# Patient Record
Sex: Female | Born: 1970 | Race: Black or African American | Hispanic: No | Marital: Single | Smoking: Never smoker
Health system: Southern US, Community
[De-identification: ages and names within clinical notes are randomized; demographics above are authoritative.]

## PROBLEM LIST (undated history)

## (undated) DIAGNOSIS — A64 Unspecified sexually transmitted disease: Secondary | ICD-10-CM

## (undated) DIAGNOSIS — D219 Benign neoplasm of connective and other soft tissue, unspecified: Secondary | ICD-10-CM

## (undated) DIAGNOSIS — J329 Chronic sinusitis, unspecified: Secondary | ICD-10-CM

## (undated) DIAGNOSIS — G43909 Migraine, unspecified, not intractable, without status migrainosus: Secondary | ICD-10-CM

## (undated) DIAGNOSIS — D649 Anemia, unspecified: Secondary | ICD-10-CM

## (undated) DIAGNOSIS — I10 Essential (primary) hypertension: Secondary | ICD-10-CM

## (undated) DIAGNOSIS — K589 Irritable bowel syndrome without diarrhea: Secondary | ICD-10-CM

## (undated) DIAGNOSIS — E049 Nontoxic goiter, unspecified: Secondary | ICD-10-CM

## (undated) DIAGNOSIS — K219 Gastro-esophageal reflux disease without esophagitis: Secondary | ICD-10-CM

## (undated) DIAGNOSIS — E079 Disorder of thyroid, unspecified: Secondary | ICD-10-CM

## (undated) DIAGNOSIS — R760 Raised antibody titer: Secondary | ICD-10-CM

## (undated) DIAGNOSIS — E785 Hyperlipidemia, unspecified: Secondary | ICD-10-CM

## (undated) DIAGNOSIS — F419 Anxiety disorder, unspecified: Secondary | ICD-10-CM

## (undated) DIAGNOSIS — N907 Vulvar cyst: Secondary | ICD-10-CM

## (undated) DIAGNOSIS — R51 Headache: Secondary | ICD-10-CM

## (undated) DIAGNOSIS — N83519 Torsion of ovary and ovarian pedicle, unspecified side: Secondary | ICD-10-CM

## (undated) HISTORY — DX: Benign neoplasm of connective and other soft tissue, unspecified: D21.9

## (undated) HISTORY — DX: Hyperlipidemia, unspecified: E78.5

## (undated) HISTORY — DX: Unspecified sexually transmitted disease: A64

## (undated) HISTORY — DX: Irritable bowel syndrome, unspecified: K58.9

## (undated) HISTORY — DX: Vulvar cyst: N90.7

## (undated) HISTORY — DX: Headache: R51

## (undated) HISTORY — DX: Gastro-esophageal reflux disease without esophagitis: K21.9

## (undated) HISTORY — DX: Nontoxic goiter, unspecified: E04.9

## (undated) HISTORY — DX: Anemia, unspecified: D64.9

## (undated) HISTORY — DX: Raised antibody titer: R76.0

## (undated) HISTORY — PX: PELVIC LAPAROSCOPY: SHX162

## (undated) HISTORY — PX: TUBAL LIGATION: SHX77

## (undated) HISTORY — DX: Disorder of thyroid, unspecified: E07.9

---

## 1993-01-25 HISTORY — PX: UNILATERAL SALPINGECTOMY: SHX6160

## 1997-03-18 ENCOUNTER — Ambulatory Visit (HOSPITAL_COMMUNITY): Admission: RE | Admit: 1997-03-18 | Discharge: 1997-03-18 | Payer: Self-pay | Admitting: Obstetrics & Gynecology

## 1997-03-18 ENCOUNTER — Ambulatory Visit (HOSPITAL_COMMUNITY): Admission: AD | Admit: 1997-03-18 | Discharge: 1997-03-18 | Payer: Self-pay | Admitting: Obstetrics and Gynecology

## 1997-12-13 ENCOUNTER — Emergency Department (HOSPITAL_COMMUNITY): Admission: EM | Admit: 1997-12-13 | Discharge: 1997-12-13 | Payer: Self-pay | Admitting: Emergency Medicine

## 1998-04-17 ENCOUNTER — Encounter: Payer: Self-pay | Admitting: Internal Medicine

## 1998-04-18 ENCOUNTER — Inpatient Hospital Stay (HOSPITAL_COMMUNITY): Admission: EM | Admit: 1998-04-18 | Discharge: 1998-04-19 | Payer: Self-pay | Admitting: Internal Medicine

## 1998-11-29 ENCOUNTER — Encounter: Payer: Self-pay | Admitting: *Deleted

## 1998-11-29 ENCOUNTER — Inpatient Hospital Stay (HOSPITAL_COMMUNITY): Admission: AD | Admit: 1998-11-29 | Discharge: 1998-11-29 | Payer: Self-pay | Admitting: *Deleted

## 1999-06-26 ENCOUNTER — Emergency Department (HOSPITAL_COMMUNITY): Admission: EM | Admit: 1999-06-26 | Discharge: 1999-06-26 | Payer: Self-pay | Admitting: Emergency Medicine

## 1999-07-02 ENCOUNTER — Other Ambulatory Visit: Admission: RE | Admit: 1999-07-02 | Discharge: 1999-07-02 | Payer: Self-pay | Admitting: Gynecology

## 1999-10-17 ENCOUNTER — Ambulatory Visit (HOSPITAL_COMMUNITY): Admission: RE | Admit: 1999-10-17 | Discharge: 1999-10-17 | Payer: Self-pay | Admitting: Emergency Medicine

## 1999-11-04 ENCOUNTER — Other Ambulatory Visit: Admission: RE | Admit: 1999-11-04 | Discharge: 1999-11-04 | Payer: Self-pay | Admitting: Gynecology

## 2000-03-06 ENCOUNTER — Emergency Department (HOSPITAL_COMMUNITY): Admission: EM | Admit: 2000-03-06 | Discharge: 2000-03-06 | Payer: Self-pay | Admitting: Emergency Medicine

## 2000-03-10 ENCOUNTER — Other Ambulatory Visit: Admission: RE | Admit: 2000-03-10 | Discharge: 2000-03-10 | Payer: Self-pay | Admitting: *Deleted

## 2000-07-14 ENCOUNTER — Inpatient Hospital Stay (HOSPITAL_COMMUNITY): Admission: AD | Admit: 2000-07-14 | Discharge: 2000-07-14 | Payer: Self-pay | Admitting: *Deleted

## 2000-07-14 ENCOUNTER — Encounter: Payer: Self-pay | Admitting: *Deleted

## 2000-10-30 ENCOUNTER — Inpatient Hospital Stay (HOSPITAL_COMMUNITY): Admission: AD | Admit: 2000-10-30 | Discharge: 2000-10-30 | Payer: Self-pay | Admitting: Obstetrics and Gynecology

## 2000-12-10 ENCOUNTER — Ambulatory Visit (HOSPITAL_COMMUNITY): Admission: RE | Admit: 2000-12-10 | Discharge: 2000-12-10 | Payer: Self-pay | Admitting: Emergency Medicine

## 2000-12-22 ENCOUNTER — Observation Stay (HOSPITAL_COMMUNITY): Admission: AD | Admit: 2000-12-22 | Discharge: 2000-12-23 | Payer: Self-pay | Admitting: *Deleted

## 2000-12-24 ENCOUNTER — Inpatient Hospital Stay (HOSPITAL_COMMUNITY): Admission: AD | Admit: 2000-12-24 | Discharge: 2000-12-24 | Payer: Self-pay | Admitting: Obstetrics and Gynecology

## 2000-12-25 ENCOUNTER — Inpatient Hospital Stay (HOSPITAL_COMMUNITY): Admission: AD | Admit: 2000-12-25 | Discharge: 2000-12-25 | Payer: Self-pay | Admitting: Obstetrics and Gynecology

## 2000-12-29 ENCOUNTER — Inpatient Hospital Stay (HOSPITAL_COMMUNITY): Admission: AD | Admit: 2000-12-29 | Discharge: 2000-12-29 | Payer: Self-pay | Admitting: Obstetrics and Gynecology

## 2001-01-02 ENCOUNTER — Inpatient Hospital Stay (HOSPITAL_COMMUNITY): Admission: AD | Admit: 2001-01-02 | Discharge: 2001-01-05 | Payer: Self-pay | Admitting: Obstetrics and Gynecology

## 2001-01-02 ENCOUNTER — Encounter (INDEPENDENT_AMBULATORY_CARE_PROVIDER_SITE_OTHER): Payer: Self-pay

## 2001-02-06 ENCOUNTER — Other Ambulatory Visit: Admission: RE | Admit: 2001-02-06 | Discharge: 2001-02-06 | Payer: Self-pay | Admitting: Obstetrics and Gynecology

## 2001-06-23 ENCOUNTER — Inpatient Hospital Stay (HOSPITAL_COMMUNITY): Admission: AD | Admit: 2001-06-23 | Discharge: 2001-06-23 | Payer: Self-pay | Admitting: Obstetrics and Gynecology

## 2001-09-12 ENCOUNTER — Emergency Department (HOSPITAL_COMMUNITY): Admission: EM | Admit: 2001-09-12 | Discharge: 2001-09-12 | Payer: Self-pay | Admitting: Emergency Medicine

## 2001-09-26 ENCOUNTER — Emergency Department (HOSPITAL_COMMUNITY): Admission: EM | Admit: 2001-09-26 | Discharge: 2001-09-26 | Payer: Self-pay | Admitting: Emergency Medicine

## 2002-03-20 ENCOUNTER — Other Ambulatory Visit: Admission: RE | Admit: 2002-03-20 | Discharge: 2002-03-20 | Payer: Self-pay | Admitting: Obstetrics and Gynecology

## 2002-07-28 ENCOUNTER — Inpatient Hospital Stay (HOSPITAL_COMMUNITY): Admission: AD | Admit: 2002-07-28 | Discharge: 2002-07-28 | Payer: Self-pay | Admitting: Obstetrics and Gynecology

## 2002-09-11 ENCOUNTER — Inpatient Hospital Stay (HOSPITAL_COMMUNITY): Admission: AD | Admit: 2002-09-11 | Discharge: 2002-09-11 | Payer: Self-pay | Admitting: Obstetrics and Gynecology

## 2002-11-20 ENCOUNTER — Inpatient Hospital Stay (HOSPITAL_COMMUNITY): Admission: AD | Admit: 2002-11-20 | Discharge: 2002-11-20 | Payer: Self-pay | Admitting: Obstetrics and Gynecology

## 2002-11-21 ENCOUNTER — Inpatient Hospital Stay (HOSPITAL_COMMUNITY): Admission: RE | Admit: 2002-11-21 | Discharge: 2002-11-24 | Payer: Self-pay | Admitting: Obstetrics and Gynecology

## 2002-11-21 ENCOUNTER — Encounter (INDEPENDENT_AMBULATORY_CARE_PROVIDER_SITE_OTHER): Payer: Self-pay | Admitting: Specialist

## 2002-12-17 ENCOUNTER — Encounter: Admission: RE | Admit: 2002-12-17 | Discharge: 2003-01-16 | Payer: Self-pay | Admitting: Obstetrics and Gynecology

## 2002-12-31 ENCOUNTER — Other Ambulatory Visit: Admission: RE | Admit: 2002-12-31 | Discharge: 2002-12-31 | Payer: Self-pay | Admitting: Obstetrics and Gynecology

## 2003-02-16 ENCOUNTER — Encounter: Admission: RE | Admit: 2003-02-16 | Discharge: 2003-03-18 | Payer: Self-pay | Admitting: Obstetrics and Gynecology

## 2003-03-19 ENCOUNTER — Encounter: Admission: RE | Admit: 2003-03-19 | Discharge: 2003-04-18 | Payer: Self-pay | Admitting: Obstetrics and Gynecology

## 2003-05-17 ENCOUNTER — Encounter: Admission: RE | Admit: 2003-05-17 | Discharge: 2003-06-16 | Payer: Self-pay | Admitting: Obstetrics and Gynecology

## 2003-07-17 ENCOUNTER — Encounter: Admission: RE | Admit: 2003-07-17 | Discharge: 2003-08-16 | Payer: Self-pay | Admitting: Obstetrics and Gynecology

## 2003-09-16 ENCOUNTER — Encounter: Admission: RE | Admit: 2003-09-16 | Discharge: 2003-10-16 | Payer: Self-pay | Admitting: Obstetrics and Gynecology

## 2003-10-17 ENCOUNTER — Encounter: Admission: RE | Admit: 2003-10-17 | Discharge: 2003-11-16 | Payer: Self-pay | Admitting: Obstetrics and Gynecology

## 2003-12-17 ENCOUNTER — Encounter: Admission: RE | Admit: 2003-12-17 | Discharge: 2004-01-16 | Payer: Self-pay | Admitting: Obstetrics and Gynecology

## 2004-02-14 ENCOUNTER — Encounter: Admission: RE | Admit: 2004-02-14 | Discharge: 2004-03-06 | Payer: Self-pay | Admitting: Occupational Medicine

## 2004-02-16 ENCOUNTER — Encounter: Admission: RE | Admit: 2004-02-16 | Discharge: 2004-03-06 | Payer: Self-pay | Admitting: Obstetrics and Gynecology

## 2004-02-26 ENCOUNTER — Encounter: Payer: Self-pay | Admitting: Internal Medicine

## 2004-02-28 ENCOUNTER — Other Ambulatory Visit: Admission: RE | Admit: 2004-02-28 | Discharge: 2004-02-28 | Payer: Self-pay | Admitting: Obstetrics and Gynecology

## 2004-03-20 ENCOUNTER — Ambulatory Visit: Payer: Self-pay | Admitting: Cardiology

## 2004-04-08 ENCOUNTER — Encounter: Admission: RE | Admit: 2004-04-08 | Discharge: 2004-07-07 | Payer: Self-pay | Admitting: Cardiology

## 2004-04-08 ENCOUNTER — Ambulatory Visit: Payer: Self-pay | Admitting: Cardiology

## 2005-02-03 ENCOUNTER — Emergency Department (HOSPITAL_COMMUNITY): Admission: EM | Admit: 2005-02-03 | Discharge: 2005-02-03 | Payer: Self-pay | Admitting: Emergency Medicine

## 2005-03-03 ENCOUNTER — Emergency Department (HOSPITAL_COMMUNITY): Admission: EM | Admit: 2005-03-03 | Discharge: 2005-03-03 | Payer: Self-pay | Admitting: Emergency Medicine

## 2005-04-01 ENCOUNTER — Other Ambulatory Visit: Admission: RE | Admit: 2005-04-01 | Discharge: 2005-04-01 | Payer: Self-pay | Admitting: Obstetrics and Gynecology

## 2005-05-26 ENCOUNTER — Encounter: Payer: Self-pay | Admitting: Internal Medicine

## 2005-06-07 ENCOUNTER — Ambulatory Visit: Payer: Self-pay | Admitting: Internal Medicine

## 2005-06-14 ENCOUNTER — Ambulatory Visit: Payer: Self-pay | Admitting: Internal Medicine

## 2005-06-16 ENCOUNTER — Ambulatory Visit: Admission: AD | Admit: 2005-06-16 | Discharge: 2005-06-16 | Payer: Self-pay | Admitting: Internal Medicine

## 2005-09-07 ENCOUNTER — Ambulatory Visit: Payer: Self-pay | Admitting: Family Medicine

## 2005-12-22 ENCOUNTER — Ambulatory Visit: Payer: Self-pay | Admitting: Internal Medicine

## 2006-01-14 ENCOUNTER — Ambulatory Visit: Payer: Self-pay | Admitting: Cardiovascular Disease

## 2006-01-29 ENCOUNTER — Emergency Department (HOSPITAL_COMMUNITY): Admission: EM | Admit: 2006-01-29 | Discharge: 2006-01-29 | Payer: Self-pay | Admitting: Emergency Medicine

## 2006-03-20 ENCOUNTER — Emergency Department (HOSPITAL_COMMUNITY): Admission: EM | Admit: 2006-03-20 | Discharge: 2006-03-20 | Payer: Self-pay | Admitting: Emergency Medicine

## 2006-03-21 ENCOUNTER — Ambulatory Visit: Payer: Self-pay | Admitting: Internal Medicine

## 2006-03-22 ENCOUNTER — Ambulatory Visit (HOSPITAL_COMMUNITY): Admission: RE | Admit: 2006-03-22 | Discharge: 2006-03-22 | Payer: Self-pay | Admitting: Internal Medicine

## 2006-04-06 ENCOUNTER — Ambulatory Visit: Payer: Self-pay | Admitting: Internal Medicine

## 2006-05-16 ENCOUNTER — Ambulatory Visit: Payer: Self-pay | Admitting: Internal Medicine

## 2006-06-23 ENCOUNTER — Encounter: Payer: Self-pay | Admitting: Internal Medicine

## 2006-06-23 DIAGNOSIS — G43009 Migraine without aura, not intractable, without status migrainosus: Secondary | ICD-10-CM | POA: Insufficient documentation

## 2006-06-23 DIAGNOSIS — K589 Irritable bowel syndrome without diarrhea: Secondary | ICD-10-CM | POA: Insufficient documentation

## 2006-06-23 DIAGNOSIS — K219 Gastro-esophageal reflux disease without esophagitis: Secondary | ICD-10-CM | POA: Insufficient documentation

## 2006-06-23 DIAGNOSIS — J309 Allergic rhinitis, unspecified: Secondary | ICD-10-CM | POA: Insufficient documentation

## 2006-06-23 DIAGNOSIS — D649 Anemia, unspecified: Secondary | ICD-10-CM | POA: Insufficient documentation

## 2006-08-08 ENCOUNTER — Ambulatory Visit: Payer: Self-pay | Admitting: Internal Medicine

## 2006-08-24 ENCOUNTER — Ambulatory Visit: Payer: Self-pay | Admitting: Cardiology

## 2006-08-28 LAB — CONVERTED CEMR LAB
ALT: 17 units/L (ref 0–35)
Albumin: 3.9 g/dL (ref 3.5–5.2)
Bilirubin, Direct: 0.1 mg/dL (ref 0.0–0.3)
CO2: 26 meq/L (ref 19–32)
Calcium: 9 mg/dL (ref 8.4–10.5)
Chloride: 108 meq/L (ref 96–112)
Creatinine, Ser: 0.9 mg/dL (ref 0.4–1.2)
GFR calc non Af Amer: 76 mL/min
HCT: 35.1 % — ABNORMAL LOW (ref 36.0–46.0)
Ketones, ur: NEGATIVE mg/dL
LDL Cholesterol: 147 mg/dL — ABNORMAL HIGH (ref 0–99)
Leukocytes, UA: NEGATIVE
MCHC: 33.7 g/dL (ref 30.0–36.0)
MCV: 90 fL (ref 78.0–100.0)
Monocytes Relative: 7.6 % (ref 3.0–11.0)
Neutro Abs: 2.4 10*3/uL (ref 1.4–7.7)
Neutrophils Relative %: 39.2 % — ABNORMAL LOW (ref 43.0–77.0)
RBC: 3.9 M/uL (ref 3.87–5.11)
Specific Gravity, Urine: 1.02 (ref 1.000–1.03)
Total Bilirubin: 0.6 mg/dL (ref 0.3–1.2)
Total CHOL/HDL Ratio: 6.5
Total Protein: 6.4 g/dL (ref 6.0–8.3)
Triglycerides: 67 mg/dL (ref 0–149)
Urobilinogen, UA: 0.2 (ref 0.0–1.0)
WBC: 6.1 10*3/uL (ref 4.5–10.5)
pH: 6.5 (ref 5.0–8.0)

## 2006-08-31 ENCOUNTER — Ambulatory Visit: Payer: Self-pay | Admitting: Internal Medicine

## 2006-08-31 DIAGNOSIS — M722 Plantar fascial fibromatosis: Secondary | ICD-10-CM | POA: Insufficient documentation

## 2006-08-31 DIAGNOSIS — E785 Hyperlipidemia, unspecified: Secondary | ICD-10-CM | POA: Insufficient documentation

## 2006-08-31 DIAGNOSIS — F4322 Adjustment disorder with anxiety: Secondary | ICD-10-CM | POA: Insufficient documentation

## 2006-08-31 DIAGNOSIS — B353 Tinea pedis: Secondary | ICD-10-CM | POA: Insufficient documentation

## 2006-09-16 ENCOUNTER — Telehealth: Payer: Self-pay | Admitting: Internal Medicine

## 2006-11-17 ENCOUNTER — Telehealth (INDEPENDENT_AMBULATORY_CARE_PROVIDER_SITE_OTHER): Payer: Self-pay | Admitting: *Deleted

## 2006-11-21 ENCOUNTER — Ambulatory Visit: Payer: Self-pay | Admitting: Internal Medicine

## 2006-11-21 DIAGNOSIS — F4320 Adjustment disorder, unspecified: Secondary | ICD-10-CM | POA: Insufficient documentation

## 2006-11-21 DIAGNOSIS — G4700 Insomnia, unspecified: Secondary | ICD-10-CM | POA: Insufficient documentation

## 2006-11-21 DIAGNOSIS — R079 Chest pain, unspecified: Secondary | ICD-10-CM | POA: Insufficient documentation

## 2006-12-06 ENCOUNTER — Telehealth: Payer: Self-pay | Admitting: Internal Medicine

## 2006-12-12 ENCOUNTER — Encounter: Payer: Self-pay | Admitting: Internal Medicine

## 2007-02-13 ENCOUNTER — Ambulatory Visit: Payer: Self-pay | Admitting: Internal Medicine

## 2007-02-28 ENCOUNTER — Ambulatory Visit: Payer: Self-pay | Admitting: Internal Medicine

## 2007-03-20 ENCOUNTER — Telehealth: Payer: Self-pay | Admitting: Internal Medicine

## 2007-04-24 ENCOUNTER — Encounter: Payer: Self-pay | Admitting: Internal Medicine

## 2007-05-03 ENCOUNTER — Ambulatory Visit: Payer: Self-pay | Admitting: Internal Medicine

## 2007-05-03 DIAGNOSIS — R519 Headache, unspecified: Secondary | ICD-10-CM | POA: Insufficient documentation

## 2007-05-03 DIAGNOSIS — R51 Headache: Secondary | ICD-10-CM | POA: Insufficient documentation

## 2007-05-11 ENCOUNTER — Telehealth: Payer: Self-pay | Admitting: Internal Medicine

## 2007-07-10 ENCOUNTER — Emergency Department (HOSPITAL_COMMUNITY): Admission: EM | Admit: 2007-07-10 | Discharge: 2007-07-10 | Payer: Self-pay | Admitting: Family Medicine

## 2007-07-30 ENCOUNTER — Emergency Department (HOSPITAL_COMMUNITY): Admission: EM | Admit: 2007-07-30 | Discharge: 2007-07-30 | Payer: Self-pay | Admitting: Family Medicine

## 2007-08-09 ENCOUNTER — Emergency Department (HOSPITAL_COMMUNITY): Admission: EM | Admit: 2007-08-09 | Discharge: 2007-08-10 | Payer: Self-pay | Admitting: Emergency Medicine

## 2007-08-09 ENCOUNTER — Telehealth: Payer: Self-pay | Admitting: Internal Medicine

## 2007-08-11 ENCOUNTER — Telehealth: Payer: Self-pay | Admitting: Internal Medicine

## 2007-08-13 ENCOUNTER — Emergency Department (HOSPITAL_COMMUNITY): Admission: EM | Admit: 2007-08-13 | Discharge: 2007-08-13 | Payer: Self-pay | Admitting: Emergency Medicine

## 2007-08-16 ENCOUNTER — Encounter: Payer: Self-pay | Admitting: Internal Medicine

## 2007-08-29 ENCOUNTER — Telehealth: Payer: Self-pay | Admitting: Internal Medicine

## 2007-09-04 ENCOUNTER — Encounter: Payer: Self-pay | Admitting: Internal Medicine

## 2007-09-05 ENCOUNTER — Ambulatory Visit (HOSPITAL_COMMUNITY): Admission: RE | Admit: 2007-09-05 | Discharge: 2007-09-05 | Payer: Self-pay | Admitting: Neurology

## 2007-09-05 ENCOUNTER — Ambulatory Visit: Payer: Self-pay | Admitting: Internal Medicine

## 2007-10-23 ENCOUNTER — Emergency Department (HOSPITAL_COMMUNITY): Admission: EM | Admit: 2007-10-23 | Discharge: 2007-10-23 | Payer: Self-pay | Admitting: Family Medicine

## 2007-11-08 ENCOUNTER — Ambulatory Visit: Payer: Self-pay | Admitting: Family Medicine

## 2007-11-08 DIAGNOSIS — J019 Acute sinusitis, unspecified: Secondary | ICD-10-CM | POA: Insufficient documentation

## 2007-11-24 ENCOUNTER — Telehealth: Payer: Self-pay | Admitting: Internal Medicine

## 2007-11-29 ENCOUNTER — Telehealth: Payer: Self-pay | Admitting: Internal Medicine

## 2007-12-05 ENCOUNTER — Ambulatory Visit: Payer: Self-pay | Admitting: Internal Medicine

## 2007-12-05 DIAGNOSIS — S8990XA Unspecified injury of unspecified lower leg, initial encounter: Secondary | ICD-10-CM | POA: Insufficient documentation

## 2007-12-05 DIAGNOSIS — S99929A Unspecified injury of unspecified foot, initial encounter: Secondary | ICD-10-CM

## 2007-12-05 DIAGNOSIS — S99919A Unspecified injury of unspecified ankle, initial encounter: Secondary | ICD-10-CM | POA: Insufficient documentation

## 2008-03-21 ENCOUNTER — Telehealth: Payer: Self-pay | Admitting: Internal Medicine

## 2008-03-25 ENCOUNTER — Emergency Department (HOSPITAL_COMMUNITY): Admission: EM | Admit: 2008-03-25 | Discharge: 2008-03-25 | Payer: Self-pay | Admitting: Family Medicine

## 2008-04-26 ENCOUNTER — Emergency Department (HOSPITAL_COMMUNITY): Admission: EM | Admit: 2008-04-26 | Discharge: 2008-04-26 | Payer: Self-pay | Admitting: Family Medicine

## 2008-06-17 ENCOUNTER — Ambulatory Visit: Payer: Self-pay | Admitting: Cardiovascular Disease

## 2008-06-17 ENCOUNTER — Telehealth (INDEPENDENT_AMBULATORY_CARE_PROVIDER_SITE_OTHER): Payer: Self-pay | Admitting: *Deleted

## 2008-06-17 ENCOUNTER — Encounter: Payer: Self-pay | Admitting: Internal Medicine

## 2008-06-17 LAB — CONVERTED CEMR LAB: Sed Rate: 7 mm/hr (ref 0–22)

## 2008-06-18 ENCOUNTER — Ambulatory Visit: Payer: Self-pay

## 2008-06-18 ENCOUNTER — Encounter: Payer: Self-pay | Admitting: Internal Medicine

## 2008-06-21 ENCOUNTER — Encounter: Payer: Self-pay | Admitting: Internal Medicine

## 2008-07-24 ENCOUNTER — Emergency Department (HOSPITAL_COMMUNITY): Admission: EM | Admit: 2008-07-24 | Discharge: 2008-07-24 | Payer: Self-pay | Admitting: Family Medicine

## 2008-08-02 ENCOUNTER — Encounter: Payer: Self-pay | Admitting: Internal Medicine

## 2008-08-18 ENCOUNTER — Inpatient Hospital Stay (HOSPITAL_COMMUNITY): Admission: AD | Admit: 2008-08-18 | Discharge: 2008-08-20 | Payer: Self-pay | Admitting: Obstetrics & Gynecology

## 2008-08-20 ENCOUNTER — Encounter (INDEPENDENT_AMBULATORY_CARE_PROVIDER_SITE_OTHER): Payer: Self-pay | Admitting: Obstetrics and Gynecology

## 2008-08-26 ENCOUNTER — Ambulatory Visit (HOSPITAL_COMMUNITY): Admission: RE | Admit: 2008-08-26 | Discharge: 2008-08-26 | Payer: Self-pay | Admitting: Neurology

## 2008-11-21 ENCOUNTER — Emergency Department (HOSPITAL_COMMUNITY): Admission: EM | Admit: 2008-11-21 | Discharge: 2008-11-21 | Payer: Self-pay | Admitting: Emergency Medicine

## 2009-02-25 ENCOUNTER — Ambulatory Visit: Payer: Self-pay | Admitting: Internal Medicine

## 2009-02-25 DIAGNOSIS — R002 Palpitations: Secondary | ICD-10-CM | POA: Insufficient documentation

## 2009-02-25 DIAGNOSIS — R0602 Shortness of breath: Secondary | ICD-10-CM | POA: Insufficient documentation

## 2009-02-25 DIAGNOSIS — R5383 Other fatigue: Secondary | ICD-10-CM

## 2009-02-25 DIAGNOSIS — R5381 Other malaise: Secondary | ICD-10-CM | POA: Insufficient documentation

## 2009-02-25 DIAGNOSIS — J069 Acute upper respiratory infection, unspecified: Secondary | ICD-10-CM | POA: Insufficient documentation

## 2009-03-03 ENCOUNTER — Ambulatory Visit: Payer: Self-pay | Admitting: Internal Medicine

## 2009-03-17 ENCOUNTER — Telehealth: Payer: Self-pay | Admitting: Internal Medicine

## 2009-03-18 ENCOUNTER — Telehealth: Payer: Self-pay | Admitting: Internal Medicine

## 2009-03-24 ENCOUNTER — Telehealth: Payer: Self-pay | Admitting: *Deleted

## 2009-03-24 ENCOUNTER — Ambulatory Visit: Payer: Self-pay | Admitting: Internal Medicine

## 2009-04-04 ENCOUNTER — Inpatient Hospital Stay (HOSPITAL_COMMUNITY): Admission: AD | Admit: 2009-04-04 | Discharge: 2009-04-05 | Payer: Self-pay | Admitting: Obstetrics and Gynecology

## 2009-04-04 ENCOUNTER — Ambulatory Visit: Payer: Self-pay | Admitting: Advanced Practice Midwife

## 2009-06-30 ENCOUNTER — Ambulatory Visit: Payer: Self-pay | Admitting: Internal Medicine

## 2009-07-01 ENCOUNTER — Telehealth: Payer: Self-pay | Admitting: Internal Medicine

## 2009-09-22 ENCOUNTER — Emergency Department (HOSPITAL_COMMUNITY): Admission: EM | Admit: 2009-09-22 | Discharge: 2009-09-22 | Payer: Self-pay | Admitting: Family Medicine

## 2009-10-15 ENCOUNTER — Ambulatory Visit: Payer: Self-pay | Admitting: Family Medicine

## 2009-10-17 ENCOUNTER — Encounter: Payer: Self-pay | Admitting: Family Medicine

## 2009-10-17 ENCOUNTER — Telehealth (INDEPENDENT_AMBULATORY_CARE_PROVIDER_SITE_OTHER): Payer: Self-pay | Admitting: *Deleted

## 2009-11-06 ENCOUNTER — Ambulatory Visit: Payer: Self-pay | Admitting: Internal Medicine

## 2009-11-06 DIAGNOSIS — J329 Chronic sinusitis, unspecified: Secondary | ICD-10-CM | POA: Insufficient documentation

## 2009-12-22 ENCOUNTER — Ambulatory Visit: Payer: Self-pay | Admitting: Internal Medicine

## 2010-02-16 ENCOUNTER — Encounter: Payer: Self-pay | Admitting: Neurology

## 2010-02-16 ENCOUNTER — Encounter: Payer: Self-pay | Admitting: Emergency Medicine

## 2010-02-22 LAB — CONVERTED CEMR LAB
Albumin: 3.9 g/dL (ref 3.5–5.2)
Alkaline Phosphatase: 58 units/L (ref 39–117)
BUN: 7 mg/dL (ref 6–23)
Basophils Relative: 0.6 % (ref 0.0–3.0)
Bilirubin, Direct: 0 mg/dL (ref 0.0–0.3)
Calcium: 8.9 mg/dL (ref 8.4–10.5)
Chloride: 110 meq/L (ref 96–112)
Creatinine, Ser: 1 mg/dL (ref 0.4–1.2)
Eosinophils Relative: 2.6 % (ref 0.0–5.0)
Free T4: 0.7 ng/dL (ref 0.6–1.6)
Glucose, Bld: 87 mg/dL (ref 70–99)
Lymphocytes Relative: 52.9 % — ABNORMAL HIGH (ref 12.0–46.0)
Lymphs Abs: 3.5 10*3/uL (ref 0.7–4.0)
MCHC: 33.1 g/dL (ref 30.0–36.0)
MCV: 93.8 fL (ref 78.0–100.0)
Magnesium: 2 mg/dL (ref 1.5–2.5)
Monocytes Absolute: 0.5 10*3/uL (ref 0.1–1.0)
Monocytes Relative: 7.1 % (ref 3.0–12.0)
Neutro Abs: 2.5 10*3/uL (ref 1.4–7.7)
Neutrophils Relative %: 36.8 % — ABNORMAL LOW (ref 43.0–77.0)
RBC: 3.95 M/uL (ref 3.87–5.11)
T3, Free: 2.6 pg/mL (ref 2.3–4.2)
TSH: 0.63 microintl units/mL (ref 0.35–5.50)
Total Bilirubin: 0.3 mg/dL (ref 0.3–1.2)
Total Protein: 6.8 g/dL (ref 6.0–8.3)
WBC: 6.7 10*3/uL (ref 4.5–10.5)

## 2010-02-26 NOTE — Letter (Signed)
Summary: Out of School  [place] at [facility]  [address] [place], [place] [address]   Phone: [phone]  Fax: [phone]    October 15, 2009   Student:  SVETLANA BAGBY [employer]    To Whom It May Concern:   For Medical reasons, please excuse the above named student from school for the following dates:  Start:   October 15, 2009  End:    October 16, 2009, may return on 10/17/2009  If you need additional information, please feel free to contact our office.   Sincerely,    Danise Edge MD    ****This is a legal document and cannot be tampered with.  Schools are authorized to verify all information and to do so accordingly.

## 2010-02-26 NOTE — Assessment & Plan Note (Signed)
Summary: sinuses---congestion//ccm   Vital Signs:  Patient profile:   40 year old female Menstrual status:  iud Weight:      225 pounds O2 Sat:      99 % Temp:     97.7 degrees F Pulse rate:   101 / minute BP sitting:   120 / 82  (left arm) Cuff size:   large  Vitals Entered By: Pura Spice, RN (October 15, 2009 12:02 PM) CC:  cough congsetion was ctreated at [facility] 2 wks ago with amoxicillin --never got over it " Is Patient Diabetic? No   History of Present Illness: patient is a 40 year old Caucasian female who is in today with 2+ week history of sinus congestion. She is struggling with facial pressure and pain, left ear pain, malaise, intermittent low-grade fevers and chills. She struggling with fatigue mild anorexia and nausea as well. 2 weeks ago she went to an urgent care center and was placed on amoxicillin without any improvement she is now having worsening congestion in her chest with a dry cough and tightness. She denies any sore throat, diarrhea, constipation, vomiting, abdominal pain or history of asthma. No wheezing or significant rhinorrhea is acknowledged although her cough is occasionally productive of whitish sputum. She denies chest or any GU symptoms. Patient some palpitations and she has been taking Sudafed and drinking increased amounts of caffeine over the past several days  Allergies: 1)  ! Avelox (Moxifloxacin Hcl) 2)  Codeine Phosphate (Codeine Phosphate) 3)  Sulfamethoxazole (Sulfamethoxazole)  Past History:  Past medical history reviewed for relevance to current acute and chronic problems. Social history (including risk factors) reviewed for relevance to current acute and chronic problems.  Past Medical History: Reviewed history from 02/25/2009 and no changes required. Chest Pain  neg stress test.  summer 2010 Allergic rhinitis Anemia-NOS GERD childbirth G7P2  ectopic x3  Myrena iud  2007 Endometriosis Hyperlipidemia [facility]  Darci Current    Social History: Reviewed history from 06/30/2009 and no changes required. 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   Never Smoked  no ets   but both parents smoked .  No alcohol 32 oz of caffeine per day  . living with friend after losing house     moved to 2 year   place with help back to school for SW 2 boys children visit for 2 days at a time alternate with dad.  In school  deans list ,  last semester   Review of Systems      See HPI  Physical Exam  General:  Well-developed,well-nourished,in no acute distress; alert,appropriate and cooperative throughout examination Head:  Normocephalic and atraumatic without obvious abnormalities. No apparent alopecia or balding. Eyes:  No corneal or conjunctival inflammation noted. EOMI.  Ears:  External ear exam shows no significant lesions or deformities.  Otoscopic examination reveals clear canals, tympanic membranes are intact bilaterally without bulging, retraction, inflammation or discharge. Hearing is grossly normal bilaterally. Slight clear fluid behind b/l TMs Nose:  nasal mucosa boggy and erythematous Mouth:  Oral mucosa and oropharynx without lesions or exudates.  Teeth in good repair. Neck:  No deformities, masses, or tenderness noted. Lungs:  Normal respiratory effort, chest expands symmetrically. Lungs are clear to auscultation, no crackles or wheezes. Heart:  Normal rate and regular rhythm. S1 and S2 normal without gallop, murmur, click, rub or other extra sounds. Abdomen:  Bowel sounds positive,abdomen soft and non-tender without masses, organomegaly or hernias noted. Extremities:  No clubbing, cyanosis, edema,  or deformity noted  Cervical Nodes:  No lymphadenopathy noted Psych:  Cognition and judgment appear intact. Alert and cooperative with normal attention span and concentration. No apparent delusions, illusions, hallucinations   Impression &  Recommendations:  Problem # 1:  ACUTE SINUSITIS, UNSPECIFIED (ICD-461.9)  The following medications were removed from the medication list:    Fexofenadine-pseudoephedrine 60-120 Mg Xr12h-tab (Fexofenadine-pseudoephedrine) .Marland Kitchen... 1 by mouth two times a day Her updated medication list for this problem includes:    Cefdinir 300 Mg Caps (Cefdinir) .Marland Kitchen... 1 cap by mouth two times a day x 14 days    Mucinex 600 Mg Xr12h-tab (Guaifenesin) .Marland Kitchen... 1 tab by mouth two times a day x 14 days. Push fluids  Problem # 2:  PALPITATIONS (ICD-785.1) Avoid caffeine and Sudafed and increase fluids  Complete Medication List: 1)  Topamax 200 Mg Tabs (Topiramate) .... One tablet at bedtime 2)  Lunesta 2 Mg Tabs (Eszopiclone) .Marland Kitchen.. 1 by mouth at bedtime as needed 3)  Mirena 20 Mcg/24hr Iud (Levonorgestrel) 4)  Fexofenadine Hcl 180 Mg Tabs (Fexofenadine hcl) .Marland Kitchen.. 1 tab by mouth once daily 5)  Cefdinir 300 Mg Caps (Cefdinir) .Marland Kitchen.. 1 cap by mouth two times a day x 14 days 6)  Mucinex 600 Mg Xr12h-tab (Guaifenesin) .Marland Kitchen.. 1 tab by mouth two times a day x 14 days  Patient Instructions: 1)  Please schedule a follow-up appointment as needed .  2)  Take 650 - 1000 mg of tylenol every 6 hours as needed for relief of pain or comfort of fever. Avoid taking more than 3000 mg in a 24 hour period( can cause liver damage in higher doses).  3)  May also take Aleve (Naproxen) can be taken in conjunction with Tylenol is 220mg  and can take up to twice a day 4)  Oral rehydration solution: Drink 1/2 ounce every 15 minutes. If tolerated after 1 hour, drink 1 ounce every 15 minutes. As you  can tolerate, keep adding 1/2 ounce every 15 minutes, up to a total or 2-4 ounces. Contact the office if unable to tolerate oral solution, if you keep vomiting, or you continue to have signs of dehydration. 5)  Take your antibiotic as prescribed until ALL of it is gone, but stop if you develop a rash or swelling and contact our office as soon as possible.   6)  Recommend remaining out of school for   through 10/16/2009 Prescriptions: MUCINEX 600 MG XR12H-TAB (GUAIFENESIN) 1 tab by mouth two times a day x 14 days  #28 x 1   Entered and Authorized by:   Danise Edge MD   Signed by:   Danise Edge MD on 10/15/2009   Method used:   Print then Give to Patient   RxID:   [id number] CEFDINIR 300 MG CAPS (CEFDINIR) 1 cap by mouth two times a day x 14 days  #28 x 0   Entered and Authorized by:   Danise Edge MD   Signed by:   Danise Edge MD on 10/15/2009   Method used:   Print then Give to Patient   RxID:   [id number] FEXOFENADINE HCL 180 MG TABS (FEXOFENADINE HCL) 1 tab by mouth once daily  #302 x 2   Entered and Authorized by:   Danise Edge MD   Signed by:   Danise Edge MD on 10/15/2009   Method used:   Print then Give to Patient   RxID:   [id number]

## 2010-02-26 NOTE — Letter (Signed)
Summary: Out of School  [place] at [facility]  [address] [place], [place] [address]   Phone: [phone]  Fax: [phone]    March 03, 2009   Student:  [name] [employer]    To Whom It May Concern:   For Medical reasons, please excuse the above named student from school for the following dates:  Start:   March 03, 2009  End:     when fever is gone for 24 hours    estimated February 9 or 10th .   If you need additional information, please feel free to contact our office.   Sincerely,    Madelin Headings MD    ****This is a legal document and cannot be tampered with.  Schools are authorized to verify all information and to do so accordingly.

## 2010-02-26 NOTE — Assessment & Plan Note (Signed)
Summary: Temp 102 today/dm   Vital Signs:  Patient profile:   40 year old female Menstrual status:  iud Weight:      229 pounds Temp:     98.7 degrees F oral Pulse rate:   60 / minute BP sitting:   100 / 70  (left arm) Cuff size:   large  Vitals Entered By: Romualdo Bolk, CMA (AAMA) (March 03, 2009 10:11 AM) CC: Fever 102 this am , coughing and congestion.    History of Present Illness: [name] comesin today for  above     . Since last visist  her congestion about the same but then go acute onset of  above symptom .  No flu shot this year.    [place] and [place] in last  evening.   102.    this am.   dry cough  no sob or abd pain.     No face sinus pain No cp or sob.    Go over labs  also  Preventive Screening-Counseling & Management  Alcohol-Tobacco     Alcohol drinks/day: 0     Smoking Status: never  Caffeine-Diet-Exercise     Caffeine use/day: 3     Does Patient Exercise: yes     Type of exercise: aerobic, treadmill,wts, cardio     Times/week: 4  Current Medications (verified): 1)  Epipen 2-Pak 0.3 Mg/0.57ml (1:1000) Devi (Epinephrine Hcl (Anaphylaxis)) 2)  Topamax 200 Mg Tabs (Topiramate) .... One Tablet At Bedtime 3)  Lunesta 2 Mg Tabs (Eszopiclone) .Marland Kitchen.. 1 By Mouth At Bedtime As Needed 4)  Mirena 20 Mcg/24hr Iud (Levonorgestrel)  Allergies (verified): 1)  ! Avelox (Moxifloxacin Hcl) 2)  Codeine Phosphate (Codeine Phosphate) 3)  Sulfamethoxazole (Sulfamethoxazole)  Past History:  Past medical, surgical, family and social histories (including risk factors) reviewed, and no changes noted (except as noted below).  Past Medical History: Reviewed history from 02/25/2009 and no changes required. Chest Pain  neg stress test.  summer 2010 Allergic rhinitis Anemia-NOS GERD childbirth G7P2  ectopic x3  Myrena iud 2007 Endometriosis Hyperlipidemia [facility]  Darci Current    Past Surgical History: Reviewed  history from 02/25/2009 and no changes required. IUD  Left ovary removed 06/2008  torsion.   Past History:  Care Management: Gynecology: Edward Jolly  Family History: Reviewed history from 06/17/2008 and no changes required. Family History of Anxiety mother is on Xanax and apparently applying for Social Security disability for her anxiety.  There is a history of child death in the family. Family History of CAD Female 1st degree relative <50 Family History of Sudden Death father 41 > MI  Family History Diabetes 1st degree relative Family History High cholesterol     Social History: Reviewed history from 02/25/2009 and no changes required. 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   Never Smoked  no ets  No alcohol 32 oz of caffeine per day    living with friend after losing house    2 boys children visit for 2 days at a time alternate with dad.   Review of Systems       The patient complains of anorexia and fever.  The patient denies weight loss, hoarseness, chest pain, syncope, dyspnea on exertion, peripheral edema, prolonged cough, headaches, abdominal pain, melena, hematochezia, severe indigestion/heartburn, hematuria, unusual weight change, abnormal bleeding, enlarged lymph nodes, and angioedema.    Physical Exam  General:  non toxic appearing in nad  mildly ill  Head:  normocephalic and atraumatic.   Eyes:  vision grossly intact, pupils equal, and pupils round.   Ears:  R ear normal and L ear normal.   Nose:  no external deformity and no external erythema.  congested no face pain Mouth:  mild erythema Neck:  No deformities, masses, or tenderness noted. Lungs:  Normal respiratory effort, chest expands symmetrically. Lungs are clear to auscultation, no crackles or wheezes.no dullness.   Heart:  Normal rate and regular rhythm. S1 and S2 normal without gallop, murmur, click, rub or other extra sounds. Abdomen:  Bowel sounds positive,abdomen soft and non-tender without masses,  organomegaly or  noted. Pulses:  pulses intact without delay   Extremities:  no clubbing cyanosis or edema  Neurologic:  non focal  Skin:  turgor normal, color normal, no ecchymoses, no petechiae, and no purpura.   Cervical Nodes:  No lymphadenopathy noted   Impression & Recommendations:  Problem # 1:  INFLUENZA LIKE ILLNESS (ICD-487.1) this seems to be superimposed on her  baseline    congestion and not a bacterial sinusitis . also no sighns of pneumonia   and pulmonary  exam is  normal.   Expectant management and call with alarm signs .  options of tamiflu discussed  and t wishes to have this.  Problem # 2:  FATIGUE (ICD-780.79) labs are essentially normal   reviewed these today.     Complete Medication List: 1)  Epipen 2-pak 0.3 Mg/0.79ml (1:1000) Devi (Epinephrine hcl (anaphylaxis)) 2)  Topamax 200 Mg Tabs (Topiramate) .... One tablet at bedtime 3)  Lunesta 2 Mg Tabs (Eszopiclone) .Marland Kitchen.. 1 by mouth at bedtime as needed 4)  Mirena 20 Mcg/24hr Iud (Levonorgestrel) 5)  Tamiflu 75 Mg Caps (Oseltamivir phosphate) .Marland Kitchen.. 1 by mouth two times a day  for 5 days.  Patient Instructions: 1)  this acts like flu.   rest and fulids  2)  If fever not gone    then call  thurdsay about follow up .  Prescriptions: TAMIFLU 75 MG CAPS (OSELTAMIVIR PHOSPHATE) 1 by mouth two times a day  for 5 days.  #10 x 0   Entered and Authorized by:   Madelin Headings MD   Signed by:   Madelin Headings MD on 03/03/2009   Method used:   Print then Give to Patient   RxID:   [id number]

## 2010-02-26 NOTE — Letter (Signed)
Summary: Out of School  [place] at [facility]  [address] [place], [place] [address]   Phone: [phone]  Fax: [phone]    March 03, 2009   Student:  [name] [employer]    To Whom It May Concern:   For Medical reasons, please excuse the above named student from school for the following dates:  Start:   March 03, 2009  End:     If you need additional information, please feel free to contact our office.   Sincerely,    Madelin Headings MD    ****This is a legal document and cannot be tampered with.  Schools are authorized to verify all information and to do so accordingly.

## 2010-02-26 NOTE — Progress Notes (Signed)
Summary: sinus infection?  Phone Note Call from Patient   Caller: pt. Call For: Madelin Headings MD Summary of Call: Chilton Si and bloody mucus from sinus and post nasal drainage S/P flu.  Would like an antibiotic.  No fever [phone] Initial call taken by: Lynann Beaver CMA,  March 17, 2009 12:15 PM  Follow-up for Phone Call        if no fever  and over 2 weeks out can  add  amoxicillin 875 1 by mouth three times a day for 10 days for sinusitis and use saline sprays and irrigations. If not improving in 5 days then needs rov to evaluate. Follow-up by: Madelin Headings MD,  March 17, 2009 12:26 PM  Additional Follow-up for Phone Call Additional follow up Details #1::        [facility] for name of pharmacy Additional Follow-up by: Raechel Ache, RN,  March 17, 2009 1:08 PM    Additional Follow-up for Phone Call Additional follow up Details #2::    Rx called to [facility] Follow-up by: Raechel Ache, RN,  March 17, 2009 1:33 PM

## 2010-02-26 NOTE — Letter (Signed)
Summary: Out of School  [place] at [facility]  [address] [place], [place] [address]   Phone: [phone]  Fax: [phone]    March 03, 2009   Student:  [name] [employer]    To Whom It May Concern:   For Medical reasons, please excuse the above named student from school for the following dates:  Start:   March 03, 2009  End:    February   9-10 or when fever is gone for 24 hours   If you need additional information, please feel free to contact our office.   Sincerely,    Madelin Headings MD    ****This is a legal document and cannot be tampered with.  Schools are authorized to verify all information and to do so accordingly.

## 2010-02-26 NOTE — Progress Notes (Signed)
Summary: needs another note  Phone Note Call from Patient Call back at [phone]   Caller: Patient---live call Summary of Call: not any better. Did not to school today. was told to call Dr Abner Greenspan today, if not any better, to get another note to be excused for today. she will pick up note on Monday. Initial call taken by: Warnell Forester,  October 17, 2009 11:31 AM  Follow-up for Phone Call        OK to extend note to keep her out until Monday. I already printed the note Follow-up by: Danise Edge MD,  October 17, 2009 12:04 PM  Additional Follow-up for Phone Call Additional follow up Details #1::        Note put in front cabinet. Additional Follow-up by: Josph Macho RMA,  October 17, 2009 12:08 PM

## 2010-02-26 NOTE — Assessment & Plan Note (Signed)
Summary: cough/dm   Vital Signs:  Patient profile:   40 year old female Menstrual status:  iud Height:      66 inches Weight:      237 pounds BMI:     38.39 O2 Sat:      99 % on Room air Pulse rate:   81 / minute BP sitting:   110 / 60  (left arm) Cuff size:   large  Vitals Entered By: Romualdo Bolk, CMA (AAMA) (June 30, 2009 1:28 PM)  O2 Flow:  Room air CC: Coughing, congestion, sob and wheezing that started 3 weeks ago but went away and come back   History of Present Illness: [name] comes in today   for SDA .  She has a cough that has been there for 2-3 weeks . Was getting better  then worse   again   and up all  night  coughing.    Never had a fever but could have had aches at the beginning.   Cough is dry and deep and only ocass productive .( cant take codiene for itching se )  no tobacco or ets.    No wheezing and no asthma .  Tried  otcs antihistamines decongestants  and saline.   and afrin.     Preventive Screening-Counseling & Management  Alcohol-Tobacco     Alcohol drinks/day: 0     Smoking Status: never  Caffeine-Diet-Exercise     Caffeine use/day: 3     Does Patient Exercise: yes     Type of exercise: aerobic, treadmill,wts, cardio     Times/week: 4  Current Medications (verified): 1)  Epipen 2-Pak 0.3 Mg/0.63ml (1:1000) Devi (Epinephrine Hcl (Anaphylaxis)) 2)  Topamax 200 Mg Tabs (Topiramate) .... One Tablet At Bedtime 3)  Lunesta 2 Mg Tabs (Eszopiclone) .Marland Kitchen.. 1 By Mouth At Bedtime As Needed 4)  Mirena 20 Mcg/24hr Iud (Levonorgestrel)  Allergies (verified): 1)  ! Avelox (Moxifloxacin Hcl) 2)  Codeine Phosphate (Codeine Phosphate) 3)  Sulfamethoxazole (Sulfamethoxazole)  Past History:  Past medical, surgical, family and social histories (including risk factors) reviewed, and no changes noted (except as noted below).  Past Medical History: Reviewed history from 02/25/2009 and no changes required. Chest Pain  neg stress test.  summer  2010 Allergic rhinitis Anemia-NOS GERD childbirth G7P2  ectopic x3  Myrena iud 2007 Endometriosis Hyperlipidemia [facility]  Darci Current    Past Surgical History: Reviewed history from 02/25/2009 and no changes required. IUD  Left ovary removed 06/2008  torsion.   Past History:  Care Management: Gynecology: Edward Jolly  Family History: Reviewed history from 06/17/2008 and no changes required. Family History of Anxiety mother is on Xanax and apparently applying for Social Security disability for her anxiety.  There is a history of child death in the family. Family History of CAD Female 1st degree relative <50 Family History of Sudden Death father 39 > MI  Family History Diabetes 1st degree relative Family History High cholesterol     Social History: Reviewed history from 03/24/2009 and no changes required. 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   Never Smoked  no ets   but both parents smoked .  No alcohol 32 oz of caffeine per day  . living with friend after losing house     moved to 2 year   place with help back to school for SW 2 boys children visit for 2 days at a time alternate with dad.  In school  deans list ,  last semester   Review of Systems       The patient complains of prolonged cough.  The patient denies anorexia, fever, weight loss, decreased hearing, hoarseness, syncope, dyspnea on exertion, headaches, abdominal pain, melena, difficulty walking, abnormal bleeding, enlarged lymph nodes, and angioedema.    Physical Exam  General:  Well-developed,well-nourished,in no acute distress; alert,appropriate and cooperative throughout examination mildly hoarse   dry cough  Head:  normocephalic and atraumatic.   Eyes:  vision grossly intact and pupils equal.   Ears:  R ear normal, L ear normal, and no external deformities.   Nose:  no external deformity and no nasal discharge.  mild erythema  Mouth:  pharynx pink and moist.   tonsil 1 +  no acute  findings  Neck:  No deformities, masses, or tenderness noted. Lungs:  Normal respiratory effort, chest expands symmetrically. Lungs are clear to auscultation, no crackles or wheezes. Heart:  Normal rate and regular rhythm. S1 and S2 normal without gallop, murmur, click, rub or other extra sounds. Pulses:  nl cap refill  Skin:  turgor normal and color normal.   Cervical Nodes:  No lymphadenopathy noted   Impression & Recommendations:  Problem # 1:  COUGH (ICD-786.2) ? post infectious cough  seem to be a set up for this   . No obv active infection today .  review of record shows some chornic bronchial thickening    in  the past  on her chest x ray .    if ongoing consider pfts when well. .   Complete Medication List: 1)  Epipen 2-pak 0.3 Mg/0.80ml (1:1000) Devi (Epinephrine hcl (anaphylaxis)) 2)  Topamax 200 Mg Tabs (Topiramate) .... One tablet at bedtime 3)  Lunesta 2 Mg Tabs (Eszopiclone) .Marland Kitchen.. 1 by mouth at bedtime as needed 4)  Mirena 20 Mcg/24hr Iud (Levonorgestrel) 5)  Fexofenadine-pseudoephedrine 60-120 Mg Xr12h-tab (Fexofenadine-pseudoephedrine) .Marland Kitchen.. 1 by mouth two times a day 6)  Prednisone 20 Mg Tabs (Prednisone) .... Take 1 by mouth two times a day for 5 days or as directed  Patient Instructions: 1)  take the antihistamine  2)   if not better   can add prednisone burts  3)  Call if  fever   infected looking phlegm  or wheezing  Prescriptions: PREDNISONE 20 MG TABS (PREDNISONE) take 1 by mouth two times a day for 5 days or as directed  #20 x 0   Entered and Authorized by:   Madelin Headings MD   Signed by:   Madelin Headings MD on 06/30/2009   Method used:   Print then Give to Patient   RxID:   [id number] FEXOFENADINE-PSEUDOEPHEDRINE 60-120 MG XR12H-TAB (FEXOFENADINE-PSEUDOEPHEDRINE) 1 by mouth two times a day  #60 x 3   Entered and Authorized by:   Madelin Headings MD   Signed by:   Madelin Headings MD on 06/30/2009   Method used:   Print then Give to Patient   RxID:    [id number]

## 2010-02-26 NOTE — Assessment & Plan Note (Signed)
Summary: congestion//ccm   Vital Signs:  Patient profile:   40 year old female Menstrual status:  iud Weight:      229 pounds O2 Sat:      99 % on Room air Temp:     98.6 degrees F oral Pulse rate:   80 / minute BP sitting:   120 / 70  (right arm) Cuff size:   large  Vitals Entered By: Romualdo Bolk, CMA (AAMA) (December 22, 2009 10:20 AM)  O2 Flow:  Room air CC: Coughing, congestion, nose bleeding this am. Pt states that it started in head and moved to her chest. No fever, pt does have chills. This started on 11/25.   History of Present Illness: [name] comes in today  for SDA acute visit  . Onset  suddenly  chills   no fever  achy at  4 days ago  with uri signs and now left ear  pressure and left nasal blood noted  Patternongoing and progressing.  Trying  otc  cold meds .  now   nyquil type   some help tp sleep.  No fever   but feels lik SOB  no wheezing .  Missing school today. ROS: no fever chills last week No GI Gu withthis .some face tedneresss and left nose blood  flonase stings now ( not in the past)  No rashes . 39 Yo at home    Preventive Screening-Counseling & Management  Alcohol-Tobacco     Alcohol drinks/day: 0     Smoking Status: never  Caffeine-Diet-Exercise     Caffeine use/day: 3     Does Patient Exercise: yes     Type of exercise: aerobic, treadmill,wts, cardio     Times/week: 4  Current Medications (verified): 1)  Topamax 200 Mg Tabs (Topiramate) .... One Tablet At Bedtime 2)  Mirena 20 Mcg/24hr Iud (Levonorgestrel) 3)  Fexofenadine Hcl 180 Mg Tabs (Fexofenadine Hcl) .Marland Kitchen.. 1 Tab By Mouth Once Daily 4)  Mucinex 600 Mg Xr12h-Tab (Guaifenesin) .Marland Kitchen.. 1 Tab By Mouth Two Times A Day X 14 Days 5)  Flonase 50 Mcg/act Susp (Fluticasone Propionate) .... 2sprays Each Nostril Q D 6)  Singulair 10 Mg Tabs (Montelukast Sodium) .Marland Kitchen.. 1 By Mouth Once Daily  Allergies (verified): 1)  ! Avelox (Moxifloxacin Hcl) 2)  Codeine Phosphate (Codeine Phosphate) 3)   Sulfamethoxazole (Sulfamethoxazole)  Past History:  Past medical, surgical, family and social histories (including risk factors) reviewed for relevance to current acute and chronic problems.  Past Medical History: Reviewed history from 11/06/2009 and no changes required. Chest Pain  neg stress test.  summer 2010 Allergic rhinitis hx of testing and allergic to spring and fall f=pollens Anemia-NOS GERD childbirth G7P2  ectopic x3  Myrena iud 2007 Endometriosis Hyperlipidemia Headache [facility]  Darci Current    Past Surgical History: Reviewed history from 02/25/2009 and no changes required. IUD  Left ovary removed 06/2008  torsion.   Past History:  Care Management: Gynecology: Irma Newness in the past allergy Love HA managment  Family History: Reviewed history from 06/17/2008 and no changes required. Family History of Anxiety mother is on Xanax and apparently applying for Social Security disability for her anxiety.  There is a history of child death in the family. Family History of CAD Female 1st degree relative <50 Family History of Sudden Death father 62 > MI  Family History Diabetes 1st degree relative Family History High cholesterol     Social History: Reviewed history from 06/30/2009 and no  changes required. 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   Never Smoked  no ets   but both parents smoked .  No alcohol 32 oz of caffeine per day  . living with friend after losing house     moved to 2 year   place with help back to school for SW 2 boys children visit for 2 days at a time alternate with dad.  In school  deans list ,  last semester   Review of Systems       see hpi  Physical Exam  General:  congested in nad  Eyes:  clear  Ears:  R ear normal and L ear normal.  poss clear fluid left  Nose:  no external deformity and no external erythema.  clogged  no discharge seen Mouth:  pharynx pink and moist.   no lesions Neck:  No deformities, masses, or  tenderness noted. Lungs:  Normal respiratory effort, chest expands symmetrically. Lungs are clear to auscultation, no crackles or wheezes. Heart:  normal rate, regular rhythm, and no murmur.   Skin:  turgor normal and color normal.   Cervical Nodes:  No lymphadenopathy noted   Impression & Recommendations:  Problem # 1:  URI (ICD-465.9) no alrm signs but is ill    Her updated medication list for this problem includes:    Fexofenadine Hcl 180 Mg Tabs (Fexofenadine hcl) .Marland Kitchen... 1 tab by mouth once daily    Mucinex 600 Mg Xr12h-tab (Guaifenesin) .Marland Kitchen... 1 tab by mouth two times a day x 14 days  Problem # 2:  ALLERGIC RHINITIS (ICD-477.9) ongoing   Her updated medication list for this problem includes:    Fexofenadine Hcl 180 Mg Tabs (Fexofenadine hcl) .Marland Kitchen... 1 tab by mouth once daily    Flonase 50 Mcg/act Susp (Fluticasone propionate) .Marland Kitchen... 2sprays each nostril q d  Problem # 3:  nose bleed  irritated from flonase and  infection    use saline  cool mist vaporizer etc   Complete Medication List: 1)  Topamax 200 Mg Tabs (Topiramate) .... One tablet at bedtime 2)  Mirena 20 Mcg/24hr Iud (Levonorgestrel) 3)  Fexofenadine Hcl 180 Mg Tabs (Fexofenadine hcl) .Marland Kitchen.. 1 tab by mouth once daily 4)  Mucinex 600 Mg Xr12h-tab (Guaifenesin) .Marland Kitchen.. 1 tab by mouth two times a day x 14 days 5)  Flonase 50 Mcg/act Susp (Fluticasone propionate) .... 2sprays each nostril q d 6)  Singulair 10 Mg Tabs (Montelukast sodium) .Marland Kitchen.. 1 by mouth once daily  Patient Instructions: 1)  i think this a viral respiratory infection. 2)  Can hold the flonase  for  now  saline washes  to humidify the nose and help the nose irritation. 3)  decongestants with care   afrin hx for 1-2 nights at the most. 4)  expect  improvment  by the weekend .   5)  Acute Bronchitis symptoms for less then 10 days are not  helped by antibiotics. Take over the counter cough medications. Call if no improvement in 5-7 days, sooner if increasing cough,  fever, or new symptoms ( shortness of breath, chest pain) .      Orders Added: 1)  Est. Patient Level III [[address]]

## 2010-02-26 NOTE — Assessment & Plan Note (Signed)
Summary: HEART PALPS x 2 wk/njr   Vital Signs:  Patient profile:   40 year old female Menstrual status:  iud Weight:      234 pounds O2 Sat:      98 % on Room air Pulse rate:   88 / minute BP sitting:   120 / 80  (left arm) Cuff size:   large  Vitals Entered By: Romualdo Bolk, CMA (AAMA) (February 25, 2009 2:37 PM)  O2 Flow:  Room air CC: Pt is having nausea, fatigue, achey muscles, no fever, no coughing this has been going on for 3 weeks. Pt has been having sob and heart palps. Pt states that she can't go up a flight of stairs without being short of breathe.     Menstrual Status iud Last PAP Result Done   History of Present Illness: [name] comesin for   SDA     for   above problems  . Since last visit she has  lots of life changes but  has no change in her medical status.  Has gone back to  school Social Work  at [employer]   after losing her home car and job.  but reports that she feels pretty optimistic now going back to school  [place] having a months of signs of increasing fatigue and ? change in exercise tolerance . SOB  up stairs and nausea.   also   for a month .   no vomiting    Feels like pregnancy  but has IUD.   for 3.5 years  breast tenderness   . No cough or  pleurisy or leg swelling.  On topamax for 6-7 months  for MHA  suppression  . less frequently  about 1 month    Recently having   leg cramps.  Has  teeth pain at times ? sinus infection.   Preventive Screening-Counseling & Management  Alcohol-Tobacco     Smoking Status: never  Caffeine-Diet-Exercise     Caffeine use/day: 3     Does Patient Exercise: yes     Type of exercise: aerobic, treadmill,wts, cardio     Times/week: 4  EKG  Procedure date:  02/25/2009  Findings:      Normal sinus rhythm with rate of:  62  Current Medications (verified): 1)  Epipen 2-Pak 0.3 Mg/0.45ml (1:1000) Devi (Epinephrine Hcl (Anaphylaxis)) 2)  Topamax 200 Mg Tabs (Topiramate) .... One Tablet At Bedtime 3)   Lunesta 2 Mg Tabs (Eszopiclone) .Marland Kitchen.. 1 By Mouth At Bedtime As Needed 4)  Mirena 20 Mcg/24hr Iud (Levonorgestrel)  Allergies (verified): 1)  ! Avelox (Moxifloxacin Hcl) 2)  Codeine Phosphate (Codeine Phosphate) 3)  Sulfamethoxazole (Sulfamethoxazole)  Past History:  Past medical, surgical, family and social histories (including risk factors) reviewed, and no changes noted (except as noted below).  Past Medical History: Chest Pain  neg stress test.  summer 2010 Allergic rhinitis Anemia-NOS GERD childbirth G7P2  ectopic x3  Myrena iud 2007 Endometriosis Hyperlipidemia Headache [facility]  Darci Current    Past Surgical History: IUD  Left ovary removed 06/2008  torsion.   Past History:  Care Management: Gynecology: Edward Jolly  Family History: Reviewed history from 06/17/2008 and no changes required. Family History of Anxiety mother is on Xanax and apparently applying for Social Security disability for her anxiety.  There is a history of child death in the family. Family History of CAD Female 1st degree relative <50 Family History of Sudden Death father 74 > MI  Family History Diabetes 1st degree relative Family History High cholesterol     Social History: Reviewed history from 12/05/2007 and no changes required. 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   Never Smoked  no ets  No alcohol 32 oz of caffeine per day    living with friend after losing house    2 boys children visit for 2 days at a time alternate with dad.   Review of Systems  The patient denies anorexia, fever, weight loss, weight gain, vision loss, decreased hearing, hoarseness, syncope, peripheral edema, prolonged cough, hemoptysis, abdominal pain, melena, hematochezia, severe indigestion/heartburn, hematuria, muscle weakness, transient blindness, difficulty walking, depression, abnormal bleeding, enlarged lymph nodes, and angioedema.         leg cramps   has gained weight over the last year .  with loss  of job.  has chronic post nasal congestion  Physical Exam  General:  Well-developed,well-nourished,in no acute distress; alert,appropriate and cooperative throughout examination Head:  normocephalic and atraumatic.   Eyes:  vision grossly intact, pupils equal, and pupils round.   Ears:  R ear normal and L ear normal.   Nose:  no external deformity, no external erythema, and no nasal discharge.   Mouth:  pharynx pink and moist.   Neck:  ? palpable thyroid non tender no nodules  Lungs:  Normal respiratory effort, chest expands symmetrically. Lungs are clear to auscultation, no crackles or wheezes.no dullness.   Heart:  Normal rate and regular rhythm. S1 and S2 normal without gallop, murmur, click, rub or other extra sounds.no lifts.   Abdomen:  Bowel sounds positive,abdomen soft and non-tender without masses, organomegaly or  noted. Msk:  no joint warmth, no redness over joints, and no joint deformities.   Pulses:  pulses intact without delay   Extremities:  no clubbing cyanosis or edema  Neurologic:  non focal  Skin:  turgor normal, color normal, no ecchymoses, and no petechiae.   Cervical Nodes:  No lymphadenopathy noted Psych:  Oriented X3, good eye contact, not anxious appearing, and not depressed appearing.   EKG NSR   Impression & Recommendations:  Problem # 1:  FATIGUE (ICD-780.79) multifactorial  unrevealing exam  Orders: TLB-CBC Platelet - w/Differential (85025-CBCD) TLB-Hepatic/Liver Function Pnl (80076-HEPATIC) TLB-TSH (Thyroid Stimulating Hormone) (84443-TSH) TLB-BMP (Basic Metabolic Panel-BMET) (80048-METABOL) TLB-T4 (Thyrox), Free [phone]) TLB-T3, Free (Triiodothyronine) (84481-T3FREE) TLB-Magnesium (Mg) (83735-MG) TLB-Sedimentation Rate (ESR) (85652-ESR) Urine Pregnancy Test  (95621) EKG w/ Interpretation (93000)  Problem # 2:  DYSPNEA (ICD-786.05) ? deconditioning  r/o anemia etc  Orders: TLB-CBC Platelet - w/Differential (85025-CBCD) TLB-Hepatic/Liver  Function Pnl (80076-HEPATIC) TLB-TSH (Thyroid Stimulating Hormone) (84443-TSH) TLB-BMP (Basic Metabolic Panel-BMET) (80048-METABOL) TLB-T4 (Thyrox), Free (84439-FT4R) TLB-T3, Free (Triiodothyronine) (84481-T3FREE) TLB-Magnesium (Mg) (83735-MG) EKG w/ Interpretation (93000)  Problem # 3:  PALPITATIONS (ICD-785.1) hx of neg stress echo within the year    avoid decongstants for now  Orders: TLB-CBC Platelet - w/Differential (85025-CBCD) TLB-Hepatic/Liver Function Pnl (80076-HEPATIC) TLB-TSH (Thyroid Stimulating Hormone) (84443-TSH) TLB-BMP (Basic Metabolic Panel-BMET) (80048-METABOL) TLB-T4 (Thyrox), Free (84439-FT4R) TLB-T3, Free (Triiodothyronine) (84481-T3FREE) TLB-Magnesium (Mg) (83735-MG) EKG w/ Interpretation (93000)  Problem # 4:  ?  URI (ICD-465.9) posssinus related   at present would use afrin and consider antibiotic if persistent and progressive  .  Problem # 5:  HEADACHE (ICD-784.0) on suppressive therapy.  Complete Medication List: 1)  Epipen 2-pak 0.3 Mg/0.34ml (1:1000) Devi (Epinephrine hcl (anaphylaxis)) 2)  Topamax 200 Mg Tabs (Topiramate) .... One tablet at bedtime 3)  Lunesta 2 Mg Tabs (Eszopiclone) .Marland Kitchen.. 1 by mouth  at bedtime as needed 4)  Mirena 20 Mcg/24hr Iud (Levonorgestrel)  Patient Instructions: 1)  continue saline  nose spray . can add afrin Nose spray for 3 days only  if needed to decrease sinus pressure.  if fever persistent and progressive   sinus congestion  call  .  2)  You will be informed of lab results when available.  3)  avoid eating  to compensate for drowsiness.

## 2010-02-26 NOTE — Assessment & Plan Note (Signed)
Summary: fever on antibiotic/cough/dm   Vital Signs:  Patient profile:   40 year old female Menstrual status:  iud Height:      66 inches Weight:      233 pounds O2 Sat:      98 % on Room air Temp:     98.7 degrees F oral Pulse rate:   89 / minute BP sitting:   130 / 80  (right arm) Cuff size:   large  Vitals Entered By: Romualdo Bolk, CMA (AAMA) (March 24, 2009 3:15 PM)  O2 Flow:  Room air CC: Fever started on 2/25, coughing and congestion. Pt is on amoxil 875 tid  Does patient need assistance? Functional Status Self care   History of Present Illness: [name] comes  in today for    above problem.     sinus infection had been getting better and the green bloody dc had been better   but still has face pressure then got fever 101  on feb 25-26  with cough dry   .    called to on  calll nurse and here today to recheck.   No cp sob hard to sleep with cough. kids had fever cough but are now better.   Preventive Screening-Counseling & Management  Alcohol-Tobacco     Alcohol drinks/day: 0     Smoking Status: never  Caffeine-Diet-Exercise     Caffeine use/day: 3     Does Patient Exercise: yes     Type of exercise: aerobic, treadmill,wts, cardio     Times/week: 4  Current Medications (verified): 1)  Epipen 2-Pak 0.3 Mg/0.67ml (1:1000) Devi (Epinephrine Hcl (Anaphylaxis)) 2)  Topamax 200 Mg Tabs (Topiramate) .... One Tablet At Bedtime 3)  Lunesta 2 Mg Tabs (Eszopiclone) .Marland Kitchen.. 1 By Mouth At Bedtime As Needed 4)  Mirena 20 Mcg/24hr Iud (Levonorgestrel) 5)  Tamiflu 75 Mg Caps (Oseltamivir Phosphate) .Marland Kitchen.. 1 By Mouth Two Times A Day  For 5 Days. 6)  Amoxicillin 875 Mg Tabs (Amoxicillin) .Marland Kitchen.. 1 By Mouth Three Times A Day  Allergies (verified): 1)  ! Avelox (Moxifloxacin Hcl) 2)  Codeine Phosphate (Codeine Phosphate) 3)  Sulfamethoxazole (Sulfamethoxazole)  Past History:  Past medical, surgical, family and social histories (including risk factors) reviewed, and no  changes noted (except as noted below).  Past Medical History: Reviewed history from 02/25/2009 and no changes required. Chest Pain  neg stress test.  summer 2010 Allergic rhinitis Anemia-NOS GERD childbirth G7P2  ectopic x3  Myrena iud 2007 Endometriosis Hyperlipidemia Headache [facility]  Darci Current    Past Surgical History: Reviewed history from 02/25/2009 and no changes required. IUD  Left ovary removed 06/2008  torsion.   Past History:  Care Management: Gynecology: Edward Jolly  Family History: Reviewed history from 06/17/2008 and no changes required. Family History of Anxiety mother is on Xanax and apparently applying for Social Security disability for her anxiety.  There is a history of child death in the family. Family History of CAD Female 1st degree relative <50 Family History of Sudden Death father 11 > MI  Family History Diabetes 1st degree relative Family History High cholesterol     Social History: Reviewed history from 02/25/2009 and no changes required. 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   Never Smoked  no ets  No alcohol 32 oz of caffeine per day    living with friend after losing house     moved to 2 year   place with help back to school for SW 2  boys children visit for 2 days at a time alternate with dad.   Review of Systems       The patient complains of fever.  The patient denies anorexia, chest pain, syncope, dyspnea on exertion, peripheral edema, hemoptysis, abdominal pain, muscle weakness, abnormal bleeding, enlarged lymph nodes, and angioedema.    Physical Exam  General:  alert, well-developed, well-nourished, and well-hydrated.   Head:  normocephalic, atraumatic, and no abnormalities observed.   Eyes:  vision grossly intact, pupils equal, and pupils round.   Ears:  R ear normal, L ear normal, and no external deformities.   Nose:  no external deformity, no external erythema, and no nasal discharge.  mildocngestion no face tnderness but  feels tight mid face  Mouth:  mild erythema Neck:  No deformities, masses, or tenderness noted. Lungs:  Normal respiratory effort, chest expands symmetrically. Lungs are clear to auscultation, no crackles or wheezes.no dullness.   Heart:  Normal rate and regular rhythm. S1 and S2 normal without gallop, murmur, click, rub or other extra sounds. Pulses:  nl cap refill  Extremities:  no clubbing cyanosis or edema  Neurologic:  non focal  Skin:  turgor normal, color normal, no suspicious lesions, and no ecchymoses.   Cervical Nodes:  shoddy nodes  Psych:  Oriented X3, good eye contact, not anxious appearing, and not depressed appearing.   See chest x ray neg for pneumonia  Impression & Recommendations:  Problem # 1:  FEVER (ICD-780.60) seems like interim illness on antibioitc  and sinusitis seems better   Problem # 2:  COUGH (ICD-786.2) problematic    gets hives with codiene   so ? what to take for cough  at night. ? can take dm and phenergan  Problem # 3:  SINUSITIS- ACUTE-NOS (ICD-461.9) Assessment: Improved  Her updated medication list for this problem includes:    Amoxicillin 875 Mg Tabs (Amoxicillin) .Marland Kitchen... 1 by mouth three times a day    Promethazine-dm 6.25-15 Mg/40ml Syrp (Promethazine-dm) .Marland Kitchen... 1 -2 tsp by mouth q 4-6 hours as needed cough  Complete Medication List: 1)  Epipen 2-pak 0.3 Mg/0.60ml (1:1000) Devi (Epinephrine hcl (anaphylaxis)) 2)  Topamax 200 Mg Tabs (Topiramate) .... One tablet at bedtime 3)  Lunesta 2 Mg Tabs (Eszopiclone) .Marland Kitchen.. 1 by mouth at bedtime as needed 4)  Mirena 20 Mcg/24hr Iud (Levonorgestrel) 5)  Tamiflu 75 Mg Caps (Oseltamivir phosphate) .Marland Kitchen.. 1 by mouth two times a day  for 5 days. 6)  Amoxicillin 875 Mg Tabs (Amoxicillin) .Marland Kitchen.. 1 by mouth three times a day 7)  Promethazine-dm 6.25-15 Mg/17ml Syrp (Promethazine-dm) .Marland Kitchen.. 1 -2 tsp by mouth q 4-6 hours as needed cough  Patient Instructions: 1)  i think your sinusitis is getting better  2)  finish the  antibiotic  3)  call if relapsing sinus signs with green drainage and blood. 4)  other wise i think the fever cough illness is viral  5)  x ray no pneumonia. 6)  cough med as tolerated and could last another week but fevefr should be gone in the next 24 - 48 hours . Prescriptions: PROMETHAZINE-DM 6.25-15 MG/5ML SYRP (PROMETHAZINE-DM) 1 -2 tsp by mouth q 4-6 hours as needed cough  #8  oz x 0   Entered and Authorized by:   Madelin Headings MD   Signed by:   Madelin Headings MD on 03/24/2009   Method used:   Electronically to        [facility] [address] (retail)  [address]       [place], [place]  [address]       Ph: [phone]       Fax: [phone]   RxID:   [id number]

## 2010-02-26 NOTE — Letter (Signed)
Summary: Out of School  [place] [facility]  [address] [place], [place] [address]   Phone: [phone]  Fax: [phone]    October 17, 2009   Student:  [name] [employer]    To Whom It May Concern:   For Medical reasons, please excuse the above named student from school for the following dates:  Start:   October 17, 2009  End: October 19, 2009,may retu to class on 10/20/2009    If you need additional information, please feel free to contact our office.   Sincerely,    Danise Edge MD    ****This is a legal document and cannot be tampered with.  Schools are authorized to verify all information and to do so accordingly.

## 2010-02-26 NOTE — Progress Notes (Signed)
Summary: PLEASE CALL.  Phone Note From Pharmacy   Caller: [facility] on [address]. Summary of Call: [facility] Pharmacist called to adv that pt would like RX med changed to Amoxicillin 500 mg instead of 875 mg....Marland KitchenMarland KitchenCan you advise?  [facility] [place] [phone]. Initial call taken by: Debbra Riding,  March 18, 2009 11:27 AM  Follow-up for Phone Call        amox  500 2 by mouth three times a day for sinusitis  for 10 day Follow-up by: Madelin Headings MD,  March 18, 2009 12:21 PM  Additional Follow-up for Phone Call Additional follow up Details #1::        patient already picked up original script. Additional Follow-up by: Raechel Ache, RN,  March 18, 2009 12:24 PM

## 2010-02-26 NOTE — Progress Notes (Signed)
Summary: [facility] Report  Phone Note Outgoing Call   Call placed by: Lynann [facility] CMA,  March 24, 2009 11:12 AM Call placed to: Patient Summary of Call: Pt running fevers up to 101 on antibiotic.   Appt scheduled with Dr. Fabian Sharp today. Initial call taken by: Lynann [facility] CMA,  March 24, 2009 11:12 AM  Follow-up for Phone Call       Follow-up by: Lynann Beaver CMA,  March 24, 2009 11:11 AM  New Problems: COUGH (ICD-786.2)   New Problems: COUGH (ICD-786.2)   [facility] Triage Call Report Triage Record Num: [id number] Operator: Remonia Richter Patient Name: [name] Call Date & Time: 03/23/2009 7:50:36PM Patient Phone: [phone] PCP: Neta Mends. Panosh Patient Gender: Female PCP Fax : [phone] Patient DOB: Apr 02, 1970 Practice Name: [facility] Reason for Call: developed cough 2 days ago with fever, now afebrile, cough worse and making her vomit, Delsym not helping, UID-no menses, on Amoxicillin TID for sinus infection x 4 days, asking about cough treatment, all emergent s/s ruled out,home care advise given, advised to call office in am for further advise Protocol(s) Used: Cough - Adult Recommended Outcome per Protocol: See Provider within 24 hours Reason for Outcome: Recurrent episodes of uncontrolled coughing interfering with ability to carry out usual activities or with normal sleep patterns Care Advice:  ~ Call provider for appointment.  ~ Avoid any activity that produces symptoms until evaluated by provider. Call EMS 911 if sudden onset or sudden worsening of breathing problems, struggling to breathe, high pitched noise when breathing in (stridor), unable to speak, grasping at throat, or panic/anxiety because of breathing problems.  ~ Increase fluids to 8-12 eight oz (1.6 to 2.4 liters) glasses per day, half of them to be water. Soups, popsicles, fruit juices, non-caffeinated sodas (unless restricting sodium intake), jello, broths, decaf  teas, etc. are all okay. Warm fluids can be soothing.  ~ Some prescription medications (ACE inhibitors, beta blockers) and over-the-counter medications (NSAIDs) may trigger or worsen asthma symptoms. Talk with your provider if you are taking any of these medications.  ~ Try to identify possible situations or irritants that triggered your symptoms (including medications) and be sure to tell your provider.  ~  ~ Follow your action plan for relief of symptoms, including using your quick relief medications.  ~ Call provider immediately to report symptoms, if you have not been previously diagnosed for this problem.  ~ SYMPTOM / CONDITION MANAGEMENT  ~ CAUTIONS 03/23/2009 8:00:21PM Page 1 of 1 [email or web address]

## 2010-02-26 NOTE — Progress Notes (Signed)
Summary: note & Prednisone?  Phone Note Call from Patient Call back at [facility] Phone [phone]   Summary of Call: Need note to not do the walk part of class at [employer] until she gets better.  Will do the classroom part.  Also should she start the prednisone, no better & coughed all night & did not sleep.  Phlegm still yellow green.  No fever.  No wheezing.  Tired of coughing & ribs hurt.   Initial call taken by: Rudy Jew, RN,  July 01, 2009 3:04 PM  Follow-up for Phone Call        Per Dr. Fabian Sharp- okay to do note, start prednisone and if coughing and phlegm gets worse then call the office. Follow-up by: Romualdo Bolk, CMA (AAMA),  July 01, 2009 5:45 PM

## 2010-02-26 NOTE — Assessment & Plan Note (Signed)
Summary: sinus inf/?bronchitus/cjr   Vital Signs:  Patient profile:   40 year old female Menstrual status:  iud Weight:      227 pounds Temp:     98.2 degrees F oral Pulse rate:   78 / minute BP sitting:   110 / 70  (left arm) Cuff size:   large  Vitals Entered By: Romualdo Bolk, CMA (AAMA) (November 06, 2009 9:33 AM) CC: Sinus pressure, no coughing, Left sided facial congestion more than Rt. This started 10/11 also got chills but no fever.   History of Present Illness: [name] comes in today  for   acute  SDA visit.  She states there has been a problem for a while although got better for a few days after last visit. After a week of illness  was  seen at  urg[facility]   with face pressure    given  amoxicillin and no help and saw dr Marlyn Corporal   on Sept 21 and  rx   omnicef for 14 days and prednisone x5 days and allegra.  and then started feeling better on the 8th day.  60% better for a few days   much better and now    back with face pain and congestion mostly on the right pain .  has  chills .    this am  "never gets a fever"   ;lef t  side  ear pain.  no wheezing has fatigue . hx of allergies.   shots for over a year. didnt help you.  on no reg meds for now. Hx of HA management  per Dr Sandria Manly  on topamax with help . no change in doses.   Preventive Screening-Counseling & Management  Alcohol-Tobacco     Alcohol drinks/day: 0     Smoking Status: never  Caffeine-Diet-Exercise     Caffeine use/day: 3     Does Patient Exercise: yes     Type of exercise: aerobic, treadmill,wts, cardio     Times/week: 4  Current Medications (verified): 1)  Topamax 200 Mg Tabs (Topiramate) .... One Tablet At Bedtime 2)  Lunesta 2 Mg Tabs (Eszopiclone) .Marland Kitchen.. 1 By Mouth At Bedtime As Needed 3)  Mirena 20 Mcg/24hr Iud (Levonorgestrel) 4)  Fexofenadine Hcl 180 Mg Tabs (Fexofenadine Hcl) .Marland Kitchen.. 1 Tab By Mouth Once Daily 5)  Mucinex 600 Mg Xr12h-Tab (Guaifenesin) .Marland Kitchen.. 1 Tab By Mouth Two Times A Day X  14 Days  Allergies (verified): 1)  ! Avelox (Moxifloxacin Hcl) 2)  Codeine Phosphate (Codeine Phosphate) 3)  Sulfamethoxazole (Sulfamethoxazole)  Past History:  Past medical, surgical, family and social histories (including risk factors) reviewed, and no changes noted (except as noted below).  Past Medical History: Chest Pain  neg stress test.  summer 2010 Allergic rhinitis hx of testing and allergic to spring and fall f=pollens Anemia-NOS GERD childbirth G7P2  ectopic x3  Myrena iud 2007 Endometriosis Hyperlipidemia Headache [facility]  Darci Current    Past Surgical History: Reviewed history from 02/25/2009 and no changes required. IUD  Left ovary removed 06/2008  torsion.   Past History:  Care Management: Gynecology: Irma Newness in the past allergy Love HA managment  Family History: Reviewed history from 06/17/2008 and no changes required. Family History of Anxiety mother is on Xanax and apparently applying for Social Security disability for her anxiety.  There is a history of child death in the family. Family History of CAD Female 1st degree relative <50 Family History of Sudden Death father  42 > MI  Family History Diabetes 1st degree relative Family History High cholesterol     Social History: Reviewed history from 06/30/2009 and no changes required. 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   Never Smoked  no ets   but both parents smoked .  No alcohol 32 oz of caffeine per day  . living with friend after losing house     moved to 2 year   place with help back to school for SW 2 boys children visit for 2 days at a time alternate with dad.  In school  deans list ,  last semester   Review of Systems       The patient complains of headaches.  The patient denies anorexia, weight loss, weight gain, vision loss, decreased hearing, chest pain, peripheral edema, hemoptysis, muscle weakness, transient blindness, difficulty walking, abnormal bleeding, enlarged  lymph nodes, and angioedema.          is very fatigued when gets sick   Physical Exam  General:  alert, well-developed, well-nourished, and well-hydrated.  non toxic well dressed  midly congested  Head:  normocephalic and atraumatic.   Eyes:  PERRL, EOMs full, conjunctiva clear  Ears:  R ear normal, L ear normal, and no external deformities.   Nose:  increae turbinates  tendernleft face non frontal   Mouth:  pharynx pink and moist.   Neck:  No deformities, masses, or tenderness noted. Lungs:  Normal respiratory effort, chest expands symmetrically. Lungs are clear to auscultation, no crackles or wheezes.no dullness.   Heart:  normal rate and regular rhythm.   Cervical Nodes:  No lymphadenopathy noted Psych:  Oriented X3, normally interactive, good eye contact, not anxious appearing, and not depressed appearing.     Impression & Recommendations:  Problem # 1:  SINUSITIS, RECURRENT (ICD-473.9) suspect underlying allergy    she has se of antibiotics and unsure if an antibiotic would work this time  but will get ct scan  rx for allergy and put on singularoi and flonase for control and then plan follow up . The following medications were removed from the medication list:    Cefdinir 300 Mg Caps (Cefdinir) .Marland Kitchen... 1 cap by mouth two times a day x 14 days Her updated medication list for this problem includes:    Mucinex 600 Mg Xr12h-tab (Guaifenesin) .Marland Kitchen... 1 tab by mouth two times a day x 14 days    Flonase 50 Mcg/act Susp (Fluticasone propionate) .Marland Kitchen... 2sprays each nostril q d  Problem # 2:  HEADACHE (ICD-784.0) Assessment: Unchanged on suppressive therapy  Problem # 3:  ALLERGIC RHINITIS (ICD-477.9) hx of sig amount and    failure of allergy shots to improve in the past. Her updated medication list for this problem includes:    Fexofenadine Hcl 180 Mg Tabs (Fexofenadine hcl) .Marland Kitchen... 1 tab by mouth once daily    Flonase 50 Mcg/act Susp (Fluticasone propionate) .Marland Kitchen... 2sprays each nostril q  d  Complete Medication List: 1)  Topamax 200 Mg Tabs (Topiramate) .... One tablet at bedtime 2)  Lunesta 2 Mg Tabs (Eszopiclone) .Marland Kitchen.. 1 by mouth at bedtime as needed 3)  Mirena 20 Mcg/24hr Iud (Levonorgestrel) 4)  Fexofenadine Hcl 180 Mg Tabs (Fexofenadine hcl) .Marland Kitchen.. 1 tab by mouth once daily 5)  Mucinex 600 Mg Xr12h-tab (Guaifenesin) .Marland Kitchen.. 1 tab by mouth two times a day x 14 days 6)  Prednisone 10 Mg Tabs (Prednisone) .... Take 4 by mouth once daily for 3 days then 2 by mouth once  daily for 3 days  or as directed 7)  Flonase 50 Mcg/act Susp (Fluticasone propionate) .... 2sprays each nostril q d 8)  Singulair 10 Mg Tabs (Montelukast sodium) .Marland Kitchen.. 1 by mouth once daily  Other Orders: Radiology Referral (Radiology)  Patient Instructions: 1)  get   Sinus CT scan  2)  them plan rx   3)  RX    steroid an nasal spray  and singulair  4)  depoending on the ct scan will decide on antibiotic treatment  Prescriptions: SINGULAIR 10 MG TABS (MONTELUKAST SODIUM) 1 by mouth once daily  #30 x 6   Entered and Authorized by:   Madelin Headings MD   Signed by:   Madelin Headings MD on 11/06/2009   Method used:   Print then Give to Patient   RxID:   [id number] FLONASE 50 MCG/ACT SUSP (FLUTICASONE PROPIONATE) 2sprays each nostril q d  #1 x 6   Entered and Authorized by:   Madelin Headings MD   Signed by:   Madelin Headings MD on 11/06/2009   Method used:   Print then Give to Patient   RxID:   [id number] PREDNISONE 10 MG TABS (PREDNISONE) take 4 by mouth once daily for 3 days then 2 by mouth once daily for 3 days  or as directed  #3011 x 1   Entered and Authorized by:   Madelin Headings MD   Signed by:   Madelin Headings MD on 11/06/2009   Method used:   Print then Give to Patient   RxID:   [id number]

## 2010-03-07 ENCOUNTER — Emergency Department (HOSPITAL_COMMUNITY)
Admission: EM | Admit: 2010-03-07 | Discharge: 2010-03-07 | Disposition: A | Discharge status: Home / Self Care | Payer: PRIVATE HEALTH INSURANCE | Attending: Emergency Medicine | Admitting: Emergency Medicine

## 2010-03-07 ENCOUNTER — Emergency Department (HOSPITAL_COMMUNITY): Payer: PRIVATE HEALTH INSURANCE

## 2010-03-07 DIAGNOSIS — G43909 Migraine, unspecified, not intractable, without status migrainosus: Secondary | ICD-10-CM | POA: Insufficient documentation

## 2010-03-07 LAB — POCT I-STAT, CHEM 8
BUN: 9 mg/dL (ref 6–23)
Creatinine, Ser: 1 mg/dL (ref 0.4–1.2)
Hemoglobin: 13.3 g/dL (ref 12.0–15.0)
Potassium: 3.9 mEq/L (ref 3.5–5.1)
Sodium: 144 mEq/L (ref 135–145)
TCO2: 19 mmol/L (ref 0–100)

## 2010-04-07 ENCOUNTER — Emergency Department (HOSPITAL_COMMUNITY)
Admission: EM | Admit: 2010-04-07 | Discharge: 2010-04-07 | Disposition: A | Discharge status: Home / Self Care | Payer: PRIVATE HEALTH INSURANCE | Attending: Emergency Medicine | Admitting: Emergency Medicine

## 2010-04-07 DIAGNOSIS — H53149 Visual discomfort, unspecified: Secondary | ICD-10-CM | POA: Insufficient documentation

## 2010-04-07 DIAGNOSIS — R110 Nausea: Secondary | ICD-10-CM | POA: Insufficient documentation

## 2010-04-07 DIAGNOSIS — G43909 Migraine, unspecified, not intractable, without status migrainosus: Secondary | ICD-10-CM | POA: Insufficient documentation

## 2010-04-17 LAB — GC/CHLAMYDIA PROBE AMP, GENITAL: GC Probe Amp, Genital: NEGATIVE

## 2010-04-17 LAB — CBC
HCT: 36.4 % (ref 36.0–46.0)
MCHC: 32.8 g/dL (ref 30.0–36.0)
MCV: 92.7 fL (ref 78.0–100.0)
RBC: 3.92 MIL/uL (ref 3.87–5.11)
WBC: 10.2 10*3/uL (ref 4.0–10.5)

## 2010-04-17 LAB — URINALYSIS, ROUTINE W REFLEX MICROSCOPIC
Glucose, UA: NEGATIVE mg/dL
Protein, ur: NEGATIVE mg/dL
Specific Gravity, Urine: >1.03 — ABNORMAL HIGH (ref 1.005–1.030)
Urobilinogen, UA: 0.2 mg/dL (ref 0.0–1.0)

## 2010-04-17 LAB — POCT PREGNANCY, URINE: Preg Test, Ur: NEGATIVE

## 2010-05-03 LAB — CBC
HCT: 36.4 % (ref 36.0–46.0)
Hemoglobin: 11.5 g/dL — ABNORMAL LOW (ref 12.0–15.0)
Hemoglobin: 12.3 g/dL (ref 12.0–15.0)
MCHC: 33.8 g/dL (ref 30.0–36.0)
MCHC: 34.2 g/dL (ref 30.0–36.0)
MCV: 91.4 fL (ref 78.0–100.0)
Platelets: 316 10*3/uL (ref 150–400)
RBC: 3.67 MIL/uL — ABNORMAL LOW (ref 3.87–5.11)
RDW: 13.5 % (ref 11.5–15.5)
RDW: 13.8 % (ref 11.5–15.5)

## 2010-05-03 LAB — URINALYSIS, ROUTINE W REFLEX MICROSCOPIC
Bilirubin Urine: NEGATIVE
Glucose, UA: NEGATIVE mg/dL
Hgb urine dipstick: NEGATIVE
Ketones, ur: 15 mg/dL — AB
pH: 6 (ref 5.0–8.0)

## 2010-05-03 LAB — DIFFERENTIAL
Basophils Relative: 0 % (ref 0–1)
Eosinophils Absolute: 0 10*3/uL (ref 0.0–0.7)
Monocytes Absolute: 0.5 10*3/uL (ref 0.1–1.0)
Monocytes Relative: 4 % (ref 3–12)
Neutrophils Relative %: 82 % — ABNORMAL HIGH (ref 43–77)

## 2010-05-03 LAB — COMPREHENSIVE METABOLIC PANEL
AST: 16 U/L (ref 0–37)
CO2: 23 mEq/L (ref 19–32)
Calcium: 8.5 mg/dL (ref 8.4–10.5)
Creatinine, Ser: 1.07 mg/dL (ref 0.4–1.2)
GFR calc Af Amer: >60 mL/min (ref >60)
GFR calc non Af Amer: 58 mL/min — ABNORMAL LOW (ref >60)
Total Protein: 6.6 g/dL (ref 6.0–8.3)

## 2010-05-03 LAB — WET PREP, GENITAL

## 2010-05-03 LAB — GC/CHLAMYDIA PROBE AMP, GENITAL
Chlamydia, DNA Probe: NEGATIVE
GC Probe Amp, Genital: NEGATIVE

## 2010-05-06 LAB — POCT RAPID STREP A (OFFICE): Streptococcus, Group A Screen (Direct): NEGATIVE

## 2010-06-09 NOTE — Assessment & Plan Note (Signed)
[facility]                                 ON-CALL NOTE   NAME:[name], [name]                        MRN:          [id number]  DATE:08/09/2007                            DOB:          04-25-1970    PRIMARY CARE [facility] Sp[facility]ling:  Neta Mends. Panosh, MD   TIME OF CALL:  8:29 p.m.   OBJECTIVE:  The patient has had a bad headache all day, which she  thought was sinuses, but she was seen by [facility] a week ago, and  was given a medicine with barbiturate, Tylenol, and caffeine, which she  took 2 of today, then went to sleep trying to sleep off, what she  thought was a bad headache.  When she woke up, the whole left side of  her body was basically tingling and weak, and she sounds a little bit  unstable on the phone.  I told her I want her to take either 1 Regular  or 2 baby aspirin now and get to [facility] ER as soon as possible.  I then  called the [facility] and I spoke with Alice in Triage, and told them that  a patient was coming in with possible stroke or aneurysm needed to be  evaluated.     Arta Silence, MD  Electronically Signed    RNS/MedQ  DD: 08/09/2007  DT: 08/10/2007  Job #: [id number]

## 2010-06-09 NOTE — Assessment & Plan Note (Signed)
[facility]                                 ON-CALL NOTE   NAME:[name], [name]                        MRN:          [id number]  DATE:08/13/2007                            DOB:          10-22-1970    I received a page through the answering service from [name] complaining  of numbness on her left side of her body that was radiating to the right  side now.  She states she was here in the emergency room a couple of  nights ago diagnosed with paresthesia, referred to Neurology, and she  states she has an appointment with Dr. Sandria Manly this Wednesday.  However,  [name] has continued to have these symptoms and she was frightened.  Because she is a patient of Dr. Dietrich Pates, she called our office.  I  instructed Dashawn to get reevaluated, as this did not appear to be  cardiac in etiology.  She denied lightheadedness, dizziness, presyncope,  or syncopal episodes.  Denied a migraine headache, which she has a  history of.  Denied slurred speech or blurred vision; however, the  numbness was concerning her.  I instructed her to go ahead and return to  the emergency room to get reevaluated per ER physician.     Dorian Pod, ACNP  Electronically Signed    MB/MedQ  DD: 08/13/2007  DT: 08/14/2007  Job #: [id number]

## 2010-06-09 NOTE — Assessment & Plan Note (Signed)
[facility]                            CARDIOLOGY OFFICE NOTE   NAME:Bishop, Renee RIBAUDO                      MRN:          [id number]  DATE:02/13/2007                            DOB:          May 28, 1970    Ms. [name] is a 40 year old who was seen in the past right by Dr. Juanito Doom.  She has a history of dyslipidemia.   Recently the patient, before Christmas, was sleep deprived, checked her  pressure and it was 150/100.  This was on January 18, 2007.  She said  the rest of the day it was high.  She checked it then on following days  and it was anywhere from 110-135 systolic over 70s to 80s diastolic.   She has been feeling okay. under some increased pressure.  She is being  followed by Berniece Andreas for panic.  She has a lot of family stressors  going on with separation from her significant other.   On talking to her, she is not in an active exercise program.  She is  trying to do more aerobic-type things with her children.  She is eating  whatever she wants.  She had been on a diet in the past.   She notes occasional chest pain.  Some, she says, is her acid reflux.  I  can hurt in the back and front.  It is not associated with meals; it can  happen all day.  Some is substernal associated with anxiety.  She also  notes occasional skips, but they sound isolated.  No long running  tachycardia, no dizziness.   She does have a history of reflux.   CURRENT MEDICATIONS:  1. Allegra 180.  2. Celexa 40.  3. Allergy shots three times per week.  4. BC powders p.r.n.  5. Aleve p.r.n.  6. Nasonex p.r.n.  7. Lunesta p.r.n.   PHYSICAL EXAMINATION:  GENERAL:  The patient is in no distress.  VITAL SIGNS:  Blood pressure 128/82, pulse is 63, weight 205 (down from  222 in 2006).  LUNGS:  Clear.  CARDIAC:  Regular rate and rhythm, S1-S2.  No S3-S4, no murmurs.  ABDOMEN:  Benign.  EXTREMITIES:  No edema.   LABORATORY DATA:  Lipid panel from July of 2008  shows total cholesterol  was 190, HDL of 29, LDL of 147, triglycerides of 67.  Hemoglobin A1c was  normal.  K was normal.   ELECTROCARDIOGRAM:  A 12-lead EKG revealed normal sinus rhythm was sinus  arrhythmia, 63 beats per minute.   IMPRESSION:  1. Dyslipidemia.  Needs to do better on her lipids.  She has a strong      family history with her father (though he was a smoker) who had      coronary disease at an early age.  He died in his 48s.  I would      recommend good dietary involvement.  She has seen dietary in the      past.  It is too early to say she has failed and to start      medicines.  I  also encouraged her to increase her activity, try to      get her weight down.  Again, she seems to know what she needs to      do, its just doing it.  2. Blood pressure.  Again, she should keep an eye on this.  She had 1      day where it was elevated, but the others have been well in good      line.  3. Health care maintenance.  Again, encouraged weight loss, increased      activity.  I will tentatively follow up for 1 year.  She should      have lipids checked in the summer, and I will review these with      her.  This will be through Dr. Fabian Sharp.  4. Chest pain.  I am not convinced there is any cardiac etiology.  It      is not associated with activity.  She is active at work without any      limitations.  I would like to try Omeprazole daily and see if she      has any subclinical reflux with spasm.  She does have irritable      bowel syndrome.  I will set follow up for later in the spring.     Pricilla Riffle, MD, [place]  Electronically Signed    PVR/MedQ  DD: 02/13/2007  DT: 02/14/2007  Job #: [id number]   cc:   Neta Mends. Fabian Sharp, MD

## 2010-06-09 NOTE — Op Note (Signed)
[name], [name]               ACCOUNT NO.:  [id number]   MEDICAL RECORD NO.:  [id number]          PATIENT TYPE:  INP   LOCATION:  9306                          FACILITY:  [facility]   PHYSICIAN:  Randye Lobo, M.D.   DATE OF BIRTH:  1970-06-05   DATE OF PROCEDURE:  08/20/2008  DATE OF DISCHARGE:  08/20/2008                               OPERATIVE REPORT   PREOPERATIVE DIAGNOSES:  1. Left adnexal mass.  2. Left lower quadrant pain.   POSTOPERATIVE DIAGNOSES:  1. Left ovarian torsion.   PROCEDURE:  Open laparoscopy, left oophorectomy, pelvic washings,  drainage of hemoperitoneum.   SURGEON:  Randye Lobo, MD   ASSISTANT:  Gretchen Short, [facility]   ANESTHESIA:  General endotracheal.   IV FLUIDS:  1800 mL Ringer's lactate.   ESTIMATED BLOOD LOSS:  100 mL.   HEMOPERITONEUM DRAINED:  200 mL.   URINE OUTPUT:  600 mL   COMPLICATIONS:  None.   INDICATION FOR THE PROCEDURE:  The patient is a 40 year old, gravida 24,  para 2-0-6-2, African American female, who presented early in the  morning on August 19, 2008 to the [facility] [place] reporting  the sudden onset of left lower quadrant pain which became crescendoing  in nature and which was associated with nausea and vomiting.  The  patient had a history of endometriosis and has undergone multiple  laparoscopies and laparotomies for endometriosis.  She also has a  history of ectopic pregnancy and is status post left salpingectomy and  status post cesarean section x2.  She is status post bilateral tubal  ligation.  She currently is using a [place] IUD for control of abnormal  uterine bleeding.   The patient was seen and evaluated by the nurse practitioner in the  emergency department and was admitted through Dr. Aldona Bar to the hospital  for left lower quadrant pain and an ultrasound report documenting a 9.7  x 3.4 x 4.5 cm heterogeneous soft tissue density.  This examination was  apparently technically difficult due to the  patient's discomfort during  the exam and difficulty with voiding at that time.  There was also a  small amount of complex fluid in the cul-de-sac.  Her IUD was noted to  be in the endometrial canal.  The remaining pelvic ultrasound was  unremarkable.   The patient had a negative pregnancy test and a urinalysis documenting  no sign of infection.  Her hemoglobin was noted to be 12.3 and her white  blood cell count was 10.0.   The patient was admitted for pain control and she was placed on a  Dilaudid PCA.   When I assumed care of the patient later that morning, she reported that  her pain was down to level of 1 with use of the dilated PCA.  She was  able to ambulate at that point without too much difficulty.  Her vital  signs were stable.  She was examined and had some minimal tenderness in  the left lower quadrant and had no signs of an acute abdomen and she had  no guarding or rebound.  I performed a pelvic exam which documented some  equivocal cervical motion tenderness.  Her uterus palpated to be normal.  She had no tenderness in the right adnexal region.  The left adnexal  region appeared to be full and mildly tender.   At this point, I requested a CT of the abdomen and pelvis to further  characterize the left adnexal mass which was inconclusive at the time of  the ultrasound, suggesting it could be an endometrioma, a tubo-ovarian  abscess, or a possible torsion.   The CT report documented a 5.0 x 6.5 cm heterogeneous left adnexal mass  which was thought to be consistent with an endometrioma versus a left  ovarian mass versus a hydro/pyosalpinx.  The IUD was again noted within  the uterus.  No additional findings were appreciated.   After a long discussion with the patient regarding options for care, the  decision was made to proceed the following morning with an open  laparoscopy with a left ovarian cystectomy and possible left  oophorectomy and possible laparotomy after  risks, benefits, and  alternatives were reviewed.   Findings at the time of laparoscopy, the patient was noted to have a 10-  cm left ovarian torsion.  There was a thin-band like adhesion between  the ovary itself and the anterior cul-de-sac.  The left fallopian tube  was noted to be absent.  The uterus had scarring along the anterior cul-  de-sac which was consistent with her prior cesarean sections.  The right  tube and ovary were unremarkable.  The appendix tip was unremarkable.  There were Small Master;s sindows in the right posterior cul de sac.  There were no recognizable signs of active endometriosis.   The peritoneal surfaces throughout the abdomen and pelvis demonstrated  no signs of excrescences.  The liver was noted to be smooth.  There was  no adhesive disease throughout the abdomen and pelvis other than the  small band noted above.  There was a 200 mL hemoperitoneum.   SPECIMENS:  The left ovary and pelvic washings were sent to pathology.   PROCEDURE:  The patient was escorted to the day surgery unit from her  hospital room.  She did receive Ancef 1 g IV for antibiotic prophylaxis.  She received PAS stockings for DVT prophylaxis.  An inspection of the  pelvic and abdominal organs was performed and the findings are as noted  above.   Pelvic washings were collected at this time and set aside as a  pathologic specimen.   In the operating room, general endotracheal anesthesia was induced and  the patient was then placed in the dorsal lithotomy position.   Attention was then turned to the abdomen and pelvis which were sterilely  prepped and draped.  A Foley catheter was placed inside the bladder.  The speculum was placed inside the vagina and a single-tooth tenaculum  was placed on the anterior cervical lip.  The IU strings were not  visible on speculum exam.  A single-tooth tenaculum was placed on the  anterior cervical lip and a Hulka tenaculum was carefully placed  inside  the uterine cavity.  The single-tooth tenaculum and speculum were  removed.   Attention was turned to the abdomen where an Allis clamps were used to  mark the previous infraumbilical incision of the patient.  A 3 cm  incision was then created with the scalpel.  The dissection was carried  down to the fascia using a combination of sharp and blunt  dissection.  The fascia was grasped with Kocher clamps and was incised vertically.  Hemostats were used to elevate the peritoneum which was then entered  sharply with the Metzenbaum scissors.  Digital exam confirmed, there to  be the absence of adhesions underneath the umbilicus.  The Hasson trocar  was then placed in the umbilical incision after the fascia was sutured  with a figure-of-eight suture of 0 Vicryl on each side.   A CO2 pneumoperitoneum was achieved and the patient was placed in the  Trendelenburg position.  A 5-mm right and left lower quadrant incisions  were created sharply with a scalpel and 5-mm trocars were then each  placed under direct visualization of the laparoscope.   An inspection of the pelvic and abdominal organs was performed and the  findings are as noted above.  Pelvic washings were obtained.  The torsed  left ovary was grasped.  The left ureter pathway was followed along the  pelvic sidewall.  The left infundibulopelvic ligament was then grasped  with the tripolar instrument, cauterized, and then cut in multiple  bites.  A care was taken to not untwist the torsion during the  oophorectomy.  The dissection was carried through the broad ligament and  the utero-ovarian ligament was similarly cauterized and cut as the  pedicle was taken across the torsion site.  This freed the ovary.  The  small band adhesion was then similarly cauterized and cut to release the  ovary entirely.   A 5-mm laparoscope was then placed to the right lower quadrant incision  and the EndoCatch bag was placed through the umbilical  trocar.  The  specimen was placed in the bag and then drawn up through the umbilical  incision.  The ovary was sent to pathology.   After the specimen was removed, the pneumoperitoneum was reachieved and  the 10-mm laparoscope was placed through the umbilical incision.  The  pelvis was extensively irrigated and suctioned and all remaining clot  was removed.  The operative site of the left adnexa was examined and  noted to be hemostatic.   The procedure was therefore concluded.  The right and left lower  quadrant trocars were removed under visualization of the laparoscope.  The pneumoperitoneum was released and the umbilical trocar was removed.   The fascia was closed with a running suture of 0 Vicryl.  The fascial  securing sutures for the Hasson trocar were then tied together.  The  subcutaneous layer was closed with interrupted sutures of 3-0 plain gut  suture which also helped to create hemostasis in this area.  The  laparoscopic incisions were then closed with subcuticular closures of 4-  0 Vicryl followed by Dermabond.   The Hulka tenaculum was removed and the Foley catheter was taken out.   This concluded the patient's procedure.  There were no complications.  All needle, instrument, and sponge counts were correct.  The patient was  excorted to the recovery room in stable and awake condition.     Randye Lobo, M.D.  Electronically Signed    BES/MEDQ  D:  08/20/2008  T:  08/21/2008  Job:  [id number]

## 2010-06-09 NOTE — Assessment & Plan Note (Signed)
[facility]                                 ON-CALL NOTE   NAME:Bishop, Renee                        MRN:          [id number]  DATE:08/13/2007                            DOB:          Apr 26, 1970    The patient called at 2:17 p.m. on August 13, 2007 stating that she had  been in the emergency room on Tuesday with facial numbness and pain on  the left side of the body.  She was referred to a neurologist, but has  not been seen yet, and now she states that symptoms are spreading to the  opposite side.  I have recommended that the patient go back to the  emergency room to be seen.  She states on the emergency room table she  had a strange type of migraine.  She states that she has not had a  headache since she was in the emergency room and the numbness and pain  is spreading to the opposite side now.  The patient will go back to the  emergency room to be evaluated.     Lelon Perla, DO  Electronically Signed    Shawnie Dapper  DD: 08/13/2007  DT: 08/14/2007  Job #: [id number]   cc:   Neta Mends. Fabian Sharp, MD

## 2010-06-12 NOTE — Discharge Summary (Signed)
[facility]  Patient:    [name], [name] Visit Number: [id number] MRN: [id number]          Service Type: OBS Location: 910A 9130 01 Attending Physician:  Frederich Balding Dictated by:   Danie Chandler, R.N. Admit Date:  01/02/2001 Discharge Date: 01/05/2001                             Discharge Summary  ADMITTING DIAGNOSIS:          Intrauterine pregnancy at 36-1/2 weeks                               estimated gestational age with spontaneous                               rupture of membranes.  DISCHARGE DIAGNOSES:          1. Intrauterine pregnancy at 36-1/2 weeks                                  estimated gestational age with spontaneous                                  rupture of membranes.                               2. Arrest of cervical dilation.  OPERATION:                    January 02, 2001 primary low transverse                               cesarean section.  REASON FOR ADMISSION:         The patient is a 40 year old, married, black female, Gravida 6, Para 0 status post ectopic times three at 36-1/2 weeks who presented to labor and delivery with spontaneous rupture of membrane approximately 12:30 a.m. on January 02, 2001.  At 2:30 p.m. that day the cervix was 2 cm dilated, 70% effaced and minus 3 station.  An intrauterine pressure catheter was placed.  Fetal heart tones were reactive.  An epidural was in place and the patient was on Pitocin.  Following this, there was approximately 10 minute episode of fetal heart tones in the 80s to 100s.  This resolved with discontinuation of Pitocin and resuscitation with IV fluids and oxygen. Fetal heart tones became reactive again.  However, the cervix was without change at that time.  The patient requested cesarean section.  HOSPITAL COURSE:              The patient was taken to the operating room and underwent the above named procedure without complication.  This was productive of a  viable female infant with Apgars of 9 at one minute and 9 at five minutes and an cord pH of 7.23.  Postoperatively, on day one the patient had good pain control.  Her hemoglobin was 10.7, hematocrit 31.0 and white blood cell count 14.8.  On postoperative day two she had good return of bowel function and was  tolerating regular diet.  She was ambulating well without difficulty and she was discharged home on postoperative day three.  CONDITION ON DISCHARGE:       Good.  DIET:                         Regular as tolerated.  ACTIVITY:                     No heavy lifting.  No driving.  No vaginal entry.  FOLLOW-UP:                    She is to follow up in the office in one to two weeks for incision check.  She is to call for temperature greater than 100 degrees, persistent nausea or vomiting, heavy vaginal bleeding and/or redness or drainage from the incision site.  DISCHARGE MEDICATIONS:        1. Prenatal vitamin one p.o. q.d.                               2. Demerol p.o. as directed by M.D. Dictated by: Danie Chandler, R.N. Attending Physician:  Frederich Balding DD:  01/05/01 TD:  01/05/01 Job: [id number]

## 2010-06-12 NOTE — Op Note (Signed)
[facility]  Patient:    [name], [name] Visit Number: [id number] MRN: [id number]          Service Type: OBS Location: 910A 9130 01 Attending Physician:  Frederich Balding Dictated by:   Guy Sandifer Arleta Creek, M.D. Proc. Date: 01/02/01 Admit Date:  01/02/2001                             Operative Report  PREOPERATIVE DIAGNOSES: 1. Intrauterine pregnancy at 36-1/2 weeks estimated gestational age. 2. Arrest of cervical dilation.  POSTOPERATIVE DIAGNOSES: 1. Intrauterine pregnancy at 36-1/2 weeks estimated gestational age. 2. Arrest of cervical dilatation.  OPERATION:                    Primary low transverse cesarean section.  SURGEON:                      Guy Sandifer. Arleta Creek, M.D.  ANESTHESIA:                   Spinal.  ESTIMATED BLOOD LOSS:         800 cc.  FINDINGS:                     Viable female infant with Apgars of 9 and 9 at one and five minutes respectively.  Birth weight pending.  Arterial cord pH 7.23.  INDICATIONS AND CONSENT:      The patient is a 40 year old black female, G6, P0, AB5, status post ectopic x 3 at 36-1/2 weeks who presents to labor and delivery with spontaneous rupture of membranes approximately 12:30 a.m. on 01/02/01.  At 2:30 p.m., cervix was 2 cm dilated, 70% effaced, -3 station.  An intrauterine pressure catheter was placed.  Fetal heart tones are reactive. Epidural is in place at that time and the patient is on Pitocin.  Following this, there was an approximately 10-minute episode of fetal heart tones in the 80s to 100s.  This resolved with discontinuation of Pitocin resuscitation with IV fluids and oxygen.  The fetal heart tones became reactive again.  However, the cervix was without change at this time.  The patient requested cesarean section.  After review with the patient and the father of the baby, cesarean section was discussed.  Potential risks and complications were discussed including but not  limited to infection, bowel, bladder, ureteral damage, bleeding requiring transfusion of blood products and possible transfusion reaction, HIV and hepatitis acquisition, DVT, PE, pneumonia.  All questions were answered and consent is signed on the chart.  DESCRIPTION OF PROCEDURE:  The patient was taken to the operating room where epidural catheter was removed.  It was only one-sided.  Spinal anesthetic was then placed.  She was placed in the dorsal supine position with a 15-degree left lateral wedge.  Foley catheter was already in place.  She was prepped and draped in the sterile fashion.  After assessing for spinal anesthesia, skin was entered through the previous low transverse scar and dissection was carried out in layers.  There were some fairly dense adhesions in the subcutaneous tissues.  The peritoneum was sharply entered and extended superiorly and inferiorly.  Vesicouterine peritoneum was taken down cephalolaterally.  The bladder flap was developed and bladder blade was placed.  Uterus was incised in a low transverse manner and the uterine cavity was entered bluntly with a hemostat.  Uterine incision was  then extended cephalolaterally with the fingers.  Umbilical cord with a true knot then presents at the incision.  The vertex was then delivered.  The nuchal cord containing the knot is noted.  A simple attempt at reducing the cord was breaks the cord.  The cord is immediately clamped at both ends prior to any blood loss from the cord.  The infant is then delivered without difficulty. Good cry and tone is noted.  The oropharynx and nasopharynx are suctioned after delivery of the head and then after delivery of the baby.  The baby is then handed to waiting pediatric team.  Placenta is manually delivered and sent to pathology.  Internal uterine contour is normal.  Uterus was closed in a running locking layer of 0 Monocryl suture.  A figure-of-eight suture is placed in the middle of  the incision to obtain hemostasis.  Tubes and ovaries were normal.  The anterior peritoneum was then closed in running fashion with 0 Monocryl suture which is also used to reapproximate the pyramidalis muscle in the midline.  The anterior rectus fascia is then closed in a running fashion with 0 PDS suture and the skin is closed with clips.  All sponge, instrument and needle counts were correct and the patient was transferred to the recovery room in stable condition.  DESCRIPTION OF PROCEDURE: Dictated by:   Guy Sandifer. Arleta Creek, M.D. Attending Physician:  Frederich Balding DD:  01/02/01 TD:  01/02/01 Job: [id number]

## 2010-06-12 NOTE — H&P (Signed)
   NAME:  [name], [name]                         ACCOUNT NO.:  [id number]   MEDICAL RECORD NO.:  [id number]                   PATIENT TYPE:  INP   LOCATION:  9198                                 FACILITY:  [facility]   PHYSICIAN:  Freddy Finner, M.D.                DATE OF BIRTH:  [birth year]   DATE OF ADMISSION:  11/21/2002  DATE OF DISCHARGE:                                HISTORY & PHYSICAL   ADMISSION DIAGNOSIS:  Intrauterine pregnancy at term, surgically scarred  uterus, planned cesarean delivery on October 28, in labor.   The patient is a 40 year old who has had an essentially uneventful prenatal  course with this pregnancy and is now admitted for repeat cesarean delivery.  Her first term baby was delivered by C-section for failure to progress with  a 6 pound 7 ounce infant.  She had a planned admission for tomorrow.   REVIEW OF SYSTEMS:  The patient had the onset of labor with regular  contractions at least every 10 minutes.  This was confirmed in the office.  The fetal heart tracing showed some questionable mild late decelerations  with contractions and based on this, certainly it is elected to proceed with  delivery.   PAST MEDICAL HISTORY:  Recorded in detail in the prenatal summary, as is the  family history, and will not be repeated.   PHYSICAL EXAMINATION:  HEENT:  Grossly within normal limits.  NECK:  Thyroid gland is not palpably enlarged.  VITAL SIGNS:  Blood pressure in the office was 124/78, weight 238 pounds.  ABDOMEN:  Gravid, estimated fetal size of 7-1/2+ pounds.  EXTREMITIES:  +1 edema, without cyanosis or clubbing.   ASSESSMENT:  Intrauterine pregnancy at term, surgically scarred uterus.   PLAN:  Repeat cesarean delivery.                                                 Freddy Finner, M.D.    WRN/MEDQ  D:  11/21/2002  T:  11/21/2002  Job:  [id number]

## 2010-06-12 NOTE — Assessment & Plan Note (Signed)
[facility]                                 ON-CALL NOTE   NAME:[name], [name]                      MRN:          [id number]  DATE:03/20/2006                            DOB:          1970-10-13    Phone number [phone].  Patient of Dr. Fabian Sharp.  Date of birth [birth year].   She called at 8:54 a.m. on March 20, 2006.  She thinks she may have a  sinus infection.  She has had nasal congestion, and she has had a couple  of nosebleeds.  She has had no fever, and no shortness of breath other  that what has happened because her nose is congested.  She wonders if  she needs treatment.   PLAN:  I told her that the nosebleeds may be because of the combination  of infection and dry air with the winter weather.  She has been able to  stop them, although the last one was a little harder.  I did talk to her  about direct pressure, and then nose plug if needed.  I told her if she  was really sick with a sinus infection, that she would need to be seen,  and suggested an Urgent Care today if she did not think she could wait  for tomorrow.     Karie Schwalbe, MD  Electronically Signed    RIL/MedQ  DD: 03/20/2006  DT: 03/20/2006  Job #: [id number]   cc:   Neta Mends. Fabian Sharp, MD

## 2010-06-12 NOTE — Discharge Summary (Signed)
[name], [name]                         ACCOUNT NO.:  [id number]   MEDICAL RECORD NO.:  [id number]                   PATIENT TYPE:  INP   LOCATION:  9147                                 FACILITY:  [facility]   PHYSICIAN:  Tracie Harrier, M.D.              DATE OF BIRTH:  1970-04-17   DATE OF ADMISSION:  11/21/2002  DATE OF DISCHARGE:  11/24/2002                                 DISCHARGE SUMMARY   ADMISSION DIAGNOSES:  1. Intrauterine pregnancy at term.  2. Previous cesarean delivery.  3. Labor.  4. Multiparity, desires permanent sterilization with previous left     salpingectomy.   PROCEDURES:  1. Low transverse cesarean section.  2. Right tubal sterilization.   REASON FOR ADMISSION:  Please see dictated H&P.   HOSPITAL COURSE:  The patient was a 40 year old married white female gravida  7, para 1 who presented to [facility] in labor with regular  contractions at least every 10 minutes.  Fetal heart tone tracings had shown  some questionable mild late decelerations and based on this, decision was  made to proceed with a cesarean delivery.  The patient had been previously  scheduled for the following day.  The patient was then transferred to  [facility] and taken to the operating room where spinal  anesthesia was administered without difficulty.  A low transverse incision  was made with the delivery of a viable female infant weighing 8 pounds 8  ounces with Apgars of 9 at one minutes and 9 at five minutes.  Umbilical  cord pH was 7.26.  The patient had requested permanent sterilization.  Left  tube had been surgically removed previously.  Right tube was doubly ligated  and segment of the tube was excised.  The patient tolerated the procedure  well and was taken to the recovery room in stable condition.   On postoperative day #1, the patient was doing well.  Vital signs were  stable.  She was afebrile.  Abdomen was soft.  She was ambulating  well.  Labs reveal hemoglobin of 11.8, platelet count of 221,000, wbc count of  10.0.   On postoperative day #2, the patient was without complaints.  Vital signs  were stable.  She remained afebrile.  She had good return of bowel function.  Fundus was firm and nontender.  Incision was clean, dry and intact.  She was  tolerating a regular diet without complaints of nausea and vomiting.   On postoperative day #3, vital signs were stable.  Incision was clean, dry  and intact.  __________ patient was discharged home.   CONDITION ON DISCHARGE:  Good.   DIET:  Regular as tolerated.   ACTIVITY:  No heavy lifting, no driving x2 weeks.  No vaginal entry.   FOLLOW UP:  The patient is to follow up in the office in one to two weeks  for an incision check.  She  is to call for temperature greater than 100  degrees, persistent nausea and vomiting, heavy vaginal bleeding, and/or  redness or drainage from the incisional site.   DISCHARGE MEDICATIONS:  1. Demerol 50 mg #30 one p.o. q.4-6h. p.r.n. pain.  2. Motrin 600 mg every six hours p.r.n.  3. Prenatal vitamins one p.o. daily.  4. Colace one p.o. daily p.r.n.     Julio Sicks, N.P.                        Tracie Harrier, M.D.    CC/MEDQ  D:  12/24/2002  T:  12/24/2002  Job:  [id number]

## 2010-06-12 NOTE — Op Note (Signed)
NAME:  [name], [name]                         ACCOUNT NO.:  [id number]   MEDICAL RECORD NO.:  [id number]                   PATIENT TYPE:  INP   LOCATION:  9147                                 FACILITY:  [facility]   PHYSICIAN:  Freddy Finner, M.D.                DATE OF BIRTH:  December 02, 1970   DATE OF PROCEDURE:  11/21/2002  DATE OF DISCHARGE:                                 OPERATIVE REPORT   PREOPERATIVE DIAGNOSES:  1. Intrauterine pregnancy at term.  2. Surgically scarred uterus.   SECONDARY DIAGNOSES:  1. Patient in labor.  2. Request for surgical sterilization.   OPERATIVE PROCEDURE:  Repeat low transverse cervical cesarean section with  delivery of a viable female infant. Apgar's of 9 and 9, pH of 7.26 on arterial  cord blood. Birth weight 8 pounds 8 ounces.   SECONDARY PROCEDURE:  Right tubal ligation. Left tube was surgically absent.   SURGEON:  Freddy Finner, M.D.   ANESTHESIA:  Spinal.   ESTIMATED INTRAOPERATIVE BLOOD LOSS:  600 to 800 cc.   INTRAOPERATIVE COMPLICATIONS:  None.   PACKS/DRAINS/COUNTS:  Foley only.   CONDITION IN RECOVERY ROOM:  Good.   HOSPITAL COURSE:  The patient was admitted in labor. Scheduled for cesarean  on November 22, 2002. We elected to proceed today, given some mild late  decelerations thought to be present on the fetal monitor strip. She was  admitted. Brought to the operating room. Placed under adequate spinal  anesthesia. Placed in the dorsal recumbent position with elevation of her  right hip by 15 degrees.  Abdomen was prepped and draped in the usual  fashion using Betadine scrub followed by Betadine solution. Foley catheter  was placed using sterile technique. A lower abdominal transverse incision  was made through an old scar and carried sharply down to fascia. The fascia  was entered sharply and extended to the extent of skin incision in a  transverse direction. Rectus sheath was developed superiorly and inferiorly  with blunt and  sharp dissection. There was other dense scarring, consistent  with previous additional surgical procedures other than cesarean for ectopic  pregnancy. The dissection was continued and the peritoneum and rectus  muscles were entered and divided simultaneously. This was extended bluntly.  Small incisions were made in the rectus muscles on either side to allow  adequate room for delivery. A bladder blade was then placed. Transverse  incision was then made in the lower segment above the reflection of the  bladder. It was very thin in this location. The amniotic fluid was clear.  There was a single loose nuchal cord. The viable infant was then delivered  with a Kiwi without significant difficulty. Apgar's, cord pH, etc. are noted  above. Placenta and other parts of conception removed from the uterus and  this was confirmed by manual exploration of the uterine cavity. The uterine  incision was then closed with  running locking 0 Monocryl in a single layer.  The uterus was then delivered through the abdominal incision. Both ovaries  were noted to be normal. The uterus itself was normal. The left tube was  surgically absent. The right tube appeared to be normal. The isthmic portion  of the right tube was elevated, doubly ligated with 0 plain tie and the  segment of tube excised. Mucosal segments of tube distal to the tie were  fulgurated with a Bovie. Hemostasis was noted to be adequate, both at the  tube and at the uterine incision. The uterus was placed back within the  abdominal cavity. Irrigation was carried out. Abdominal incision was then  closed in layers. Running 0 Monocryl was used to approximate the rectus  muscles and the peritoneum as a single layer. The fascia was closed with  running 0 PDS. The subcuticular was closed with running 0 Monocryl. The skin  was closed with skin staples and quarter inch steri-strips. The patient  tolerated the operative procedure well. She was taken to  recovery in good  condition.                                               Freddy Finner, M.D.    WRN/MEDQ  D:  11/21/2002  T:  11/21/2002  Job:  [id number]

## 2010-06-14 ENCOUNTER — Inpatient Hospital Stay (HOSPITAL_COMMUNITY): Payer: PRIVATE HEALTH INSURANCE

## 2010-06-14 ENCOUNTER — Inpatient Hospital Stay (HOSPITAL_COMMUNITY)
Admission: AD | Admit: 2010-06-14 | Discharge: 2010-06-14 | Disposition: A | Discharge status: Home / Self Care | Payer: PRIVATE HEALTH INSURANCE | Source: Ambulatory Visit | Attending: Obstetrics and Gynecology | Admitting: Obstetrics and Gynecology

## 2010-06-14 DIAGNOSIS — A499 Bacterial infection, unspecified: Secondary | ICD-10-CM | POA: Insufficient documentation

## 2010-06-14 DIAGNOSIS — N760 Acute vaginitis: Secondary | ICD-10-CM

## 2010-06-14 DIAGNOSIS — N83209 Unspecified ovarian cyst, unspecified side: Secondary | ICD-10-CM | POA: Insufficient documentation

## 2010-06-14 DIAGNOSIS — R109 Unspecified abdominal pain: Secondary | ICD-10-CM | POA: Insufficient documentation

## 2010-06-14 DIAGNOSIS — B9689 Other specified bacterial agents as the cause of diseases classified elsewhere: Secondary | ICD-10-CM | POA: Insufficient documentation

## 2010-06-14 LAB — URINE MICROSCOPIC-ADD ON

## 2010-06-14 LAB — URINALYSIS, ROUTINE W REFLEX MICROSCOPIC
Bilirubin Urine: NEGATIVE
Glucose, UA: NEGATIVE mg/dL
Ketones, ur: NEGATIVE mg/dL
Leukocytes, UA: NEGATIVE
Nitrite: NEGATIVE
Protein, ur: NEGATIVE mg/dL
Specific Gravity, Urine: 1.02 (ref 1.005–1.030)
Urobilinogen, UA: 0.2 mg/dL (ref 0.0–1.0)
pH: 6 (ref 5.0–8.0)

## 2010-06-14 LAB — CBC
HCT: 36.4 % (ref 36.0–46.0)
Hemoglobin: 11.6 g/dL — ABNORMAL LOW (ref 12.0–15.0)
MCH: 29.3 pg (ref 26.0–34.0)
MCHC: 31.9 g/dL (ref 30.0–36.0)
MCV: 91.9 fL (ref 78.0–100.0)
Platelets: 337 K/uL (ref 150–400)
RBC: 3.96 MIL/uL (ref 3.87–5.11)
RDW: 13.1 % (ref 11.5–15.5)
WBC: 8.3 K/uL (ref 4.0–10.5)

## 2010-06-14 LAB — DIFFERENTIAL
Basophils Absolute: 0 K/uL (ref 0.0–0.1)
Basophils Relative: 1 % (ref 0–1)
Eosinophils Absolute: 0.2 K/uL (ref 0.0–0.7)
Eosinophils Relative: 2 % (ref 0–5)
Lymphocytes Relative: 28 % (ref 12–46)
Lymphs Abs: 2.3 K/uL (ref 0.7–4.0)
Monocytes Absolute: 0.6 K/uL (ref 0.1–1.0)
Monocytes Relative: 8 % (ref 3–12)
Neutro Abs: 5.1 K/uL (ref 1.7–7.7)
Neutrophils Relative %: 62 % (ref 43–77)

## 2010-06-14 LAB — COMPREHENSIVE METABOLIC PANEL
AST: 17 U/L (ref 0–37)
Albumin: 3.3 g/dL — ABNORMAL LOW (ref 3.5–5.2)
Alkaline Phosphatase: 49 U/L (ref 39–117)
Chloride: 106 mEq/L (ref 96–112)
GFR calc Af Amer: >60 mL/min (ref >60)
Potassium: 3.8 mEq/L (ref 3.5–5.1)
Total Bilirubin: 0.1 mg/dL — ABNORMAL LOW (ref 0.3–1.2)
Total Protein: 6.3 g/dL (ref 6.0–8.3)

## 2010-06-14 LAB — WET PREP, GENITAL

## 2010-06-15 LAB — GC/CHLAMYDIA PROBE AMP, GENITAL
Chlamydia, DNA Probe: NEGATIVE
GC Probe Amp, Genital: NEGATIVE

## 2010-07-23 IMAGING — CR DG ANKLE COMPLETE 3+V*L*
3 series · 3 of 3 positions shown · non-contrast
Comparison: Right ankle examination of same date

CLINICAL DATA: History given of twisting injury.  History given of
pain.

LEFT ANKLE COMPLETE - 3+ VIEW

[t ankle joint ap left]
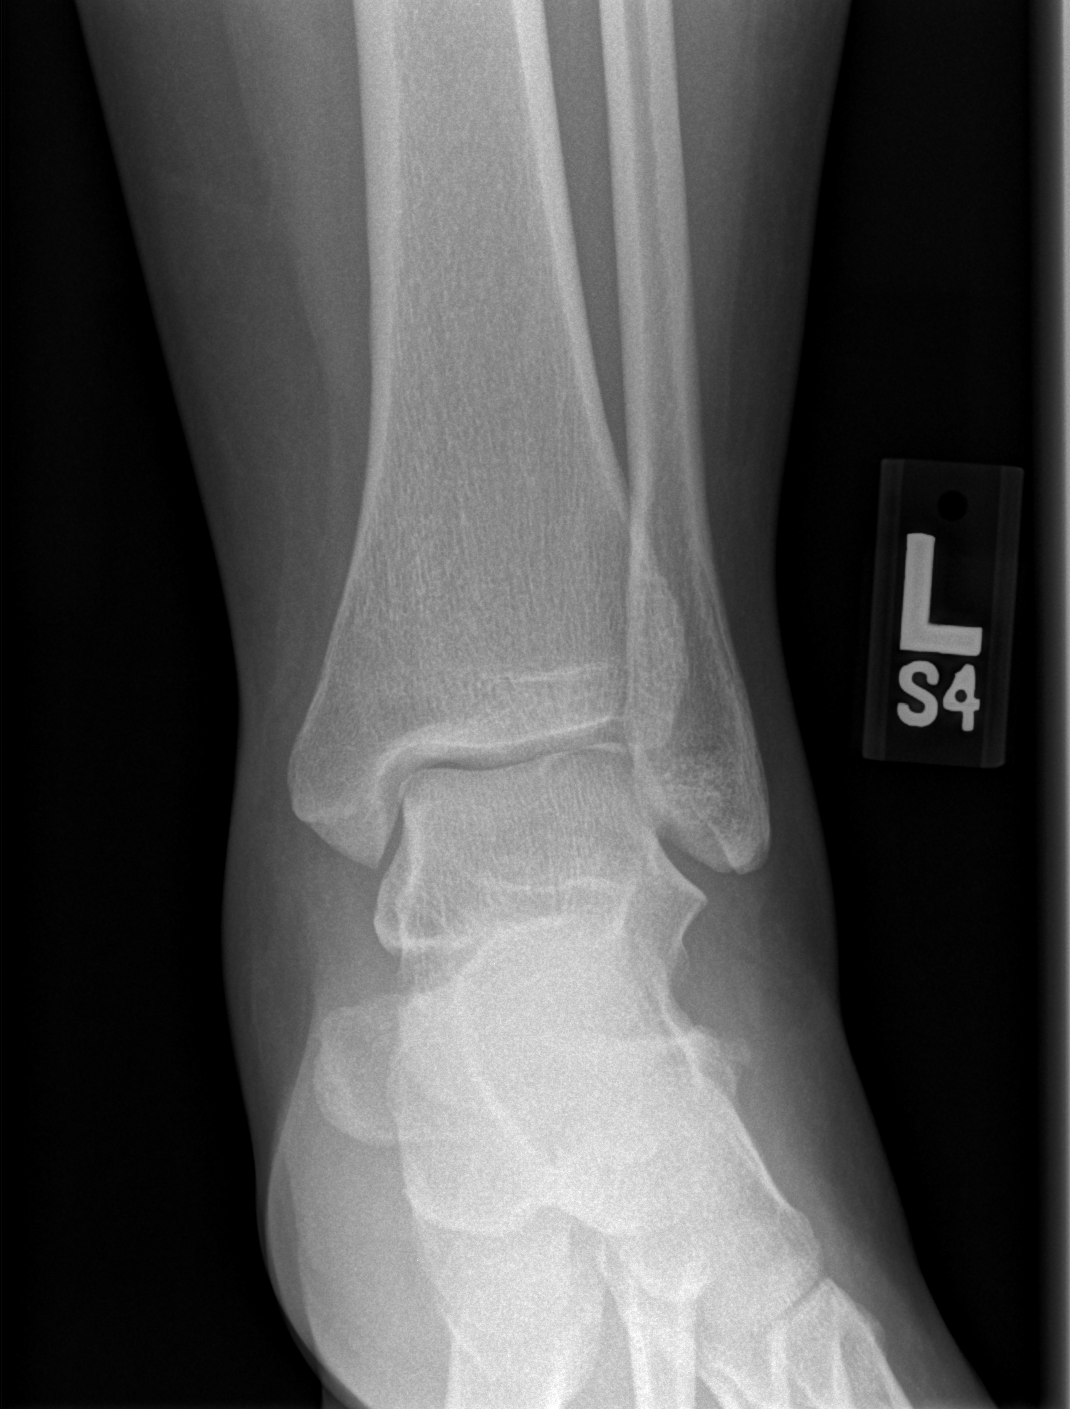

[t ankle joint oblique left]
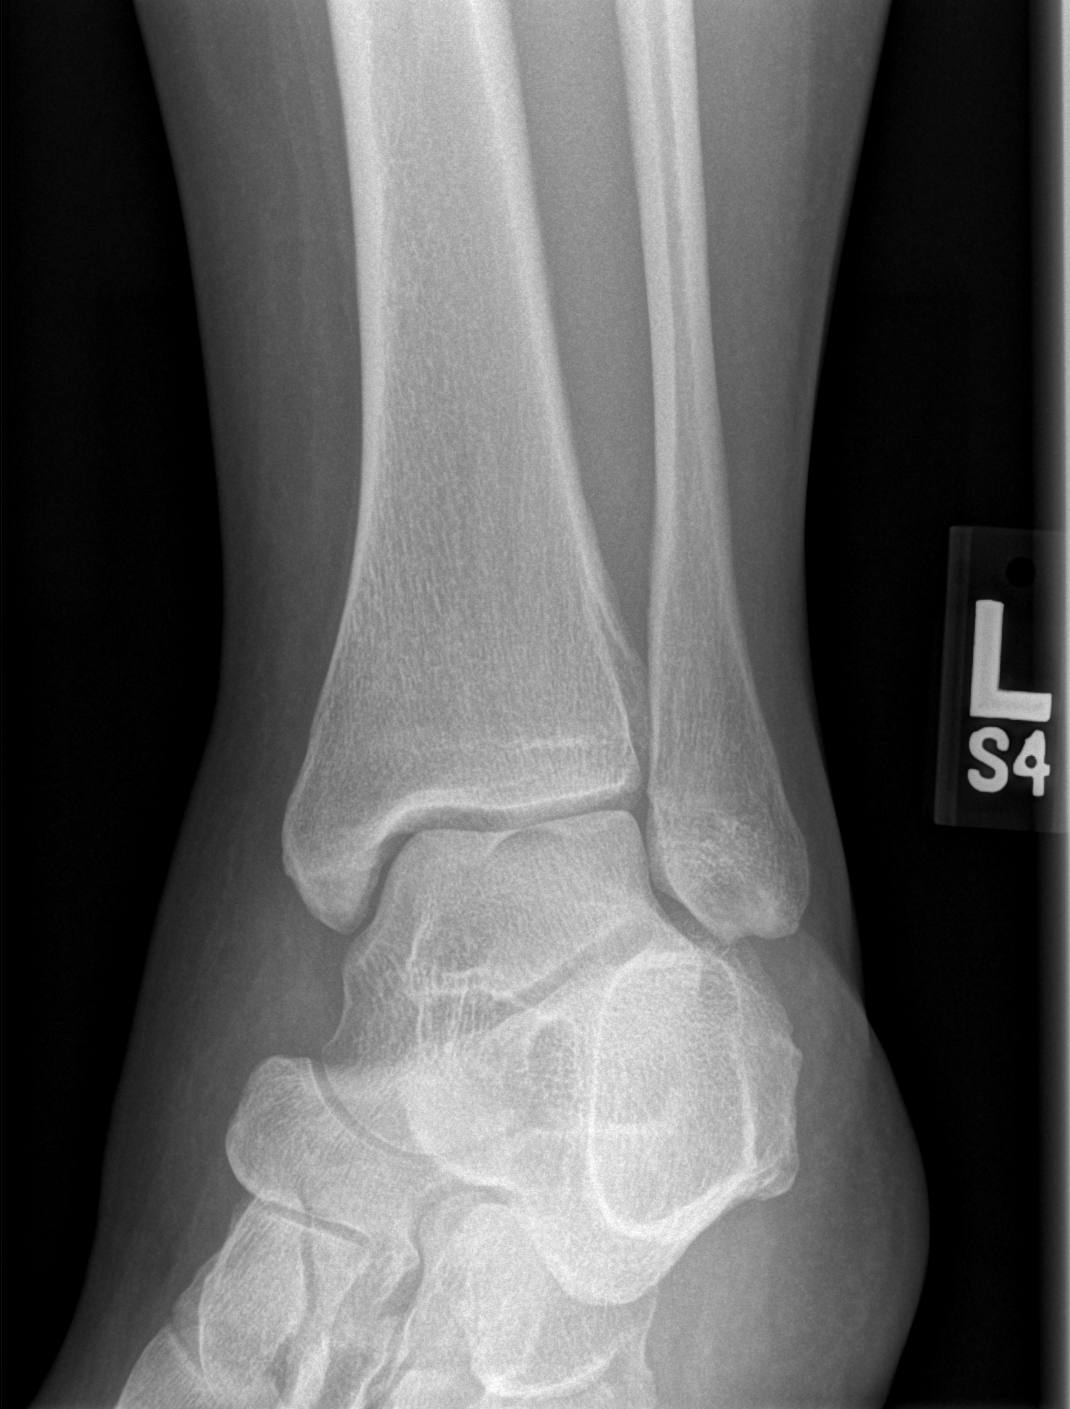

[t ankle joint lat left]
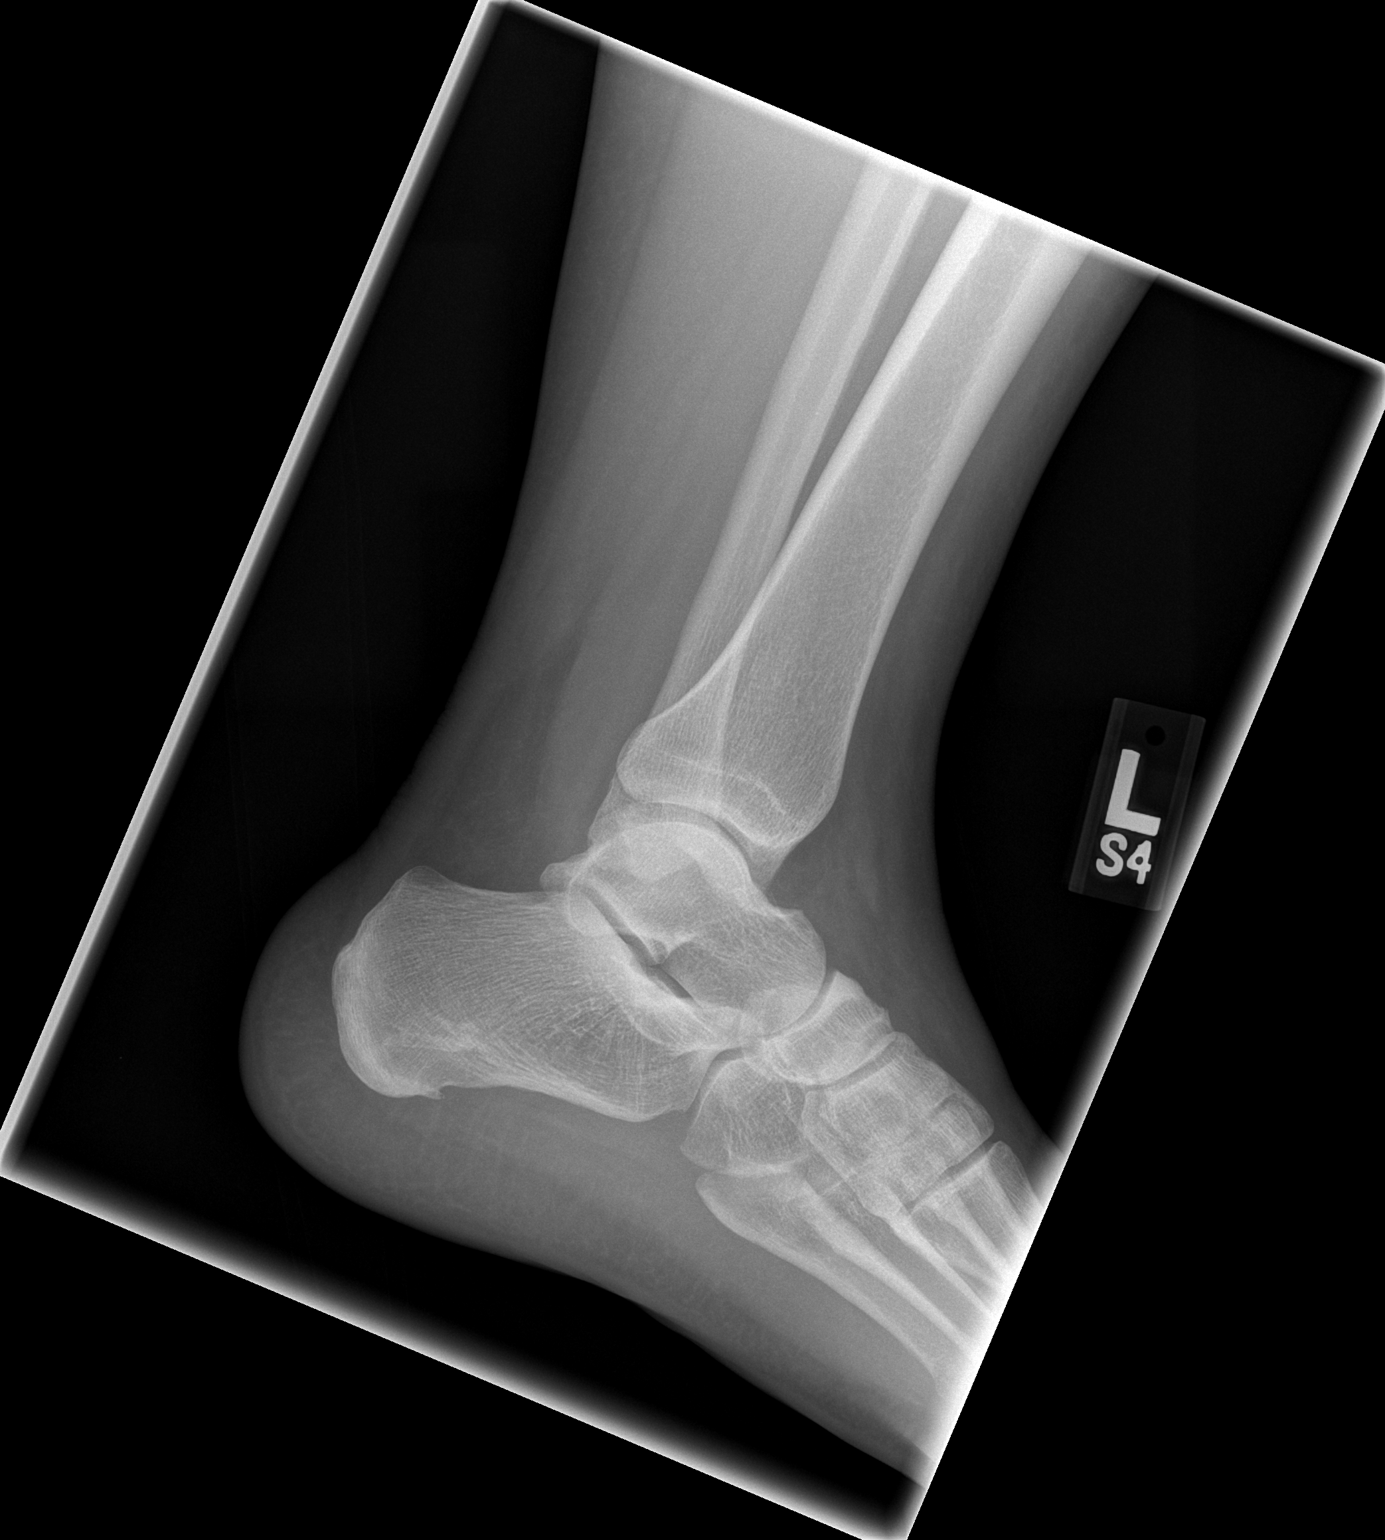

[3 of 3 positions shown; findings below may reference images not displayed]

FINDINGS: The mortise is preserved. Alignment is normal.  Joint
spaces are preserved.  No fracture or dislocation is evident.   A
small plantar calcaneal spur is present.  There may be slight soft
tissue swelling.
IMPRESSION: No fracture or dislocation is evident.  A small plantar calcaneal
spurs present.

## 2010-07-30 ENCOUNTER — Other Ambulatory Visit: Payer: Self-pay | Admitting: Obstetrics and Gynecology

## 2010-10-23 LAB — HEPATIC FUNCTION PANEL
ALT: 19
AST: 20
Albumin: 3.8
Alkaline Phosphatase: 52
Total Protein: 6.5

## 2010-10-23 LAB — DIFFERENTIAL
Eosinophils Absolute: 0.2
Lymphocytes Relative: 37
Lymphs Abs: 2.6
Monocytes Relative: 9
Neutrophils Relative %: 51

## 2010-10-23 LAB — BASIC METABOLIC PANEL
BUN: 10
Chloride: 107
Creatinine, Ser: 0.85
GFR calc Af Amer: >60
GFR calc non Af Amer: >60
Potassium: 3.5

## 2010-10-23 LAB — CBC
MCV: 92.1
Platelets: 394
RBC: 3.93
WBC: 7

## 2010-10-23 LAB — SEDIMENTATION RATE: Sed Rate: 5

## 2011-03-05 ENCOUNTER — Encounter (HOSPITAL_COMMUNITY): Payer: Self-pay

## 2011-03-05 ENCOUNTER — Emergency Department (INDEPENDENT_AMBULATORY_CARE_PROVIDER_SITE_OTHER)
Admission: EM | Admit: 2011-03-05 | Discharge: 2011-03-05 | Disposition: A | Discharge status: Home / Self Care | Payer: Commercial Managed Care - PPO | Source: Home / Self Care | Attending: Emergency Medicine | Admitting: Emergency Medicine

## 2011-03-05 ENCOUNTER — Other Ambulatory Visit: Payer: Self-pay

## 2011-03-05 DIAGNOSIS — J329 Chronic sinusitis, unspecified: Secondary | ICD-10-CM

## 2011-03-05 DIAGNOSIS — R079 Chest pain, unspecified: Secondary | ICD-10-CM

## 2011-03-05 HISTORY — DX: Anxiety disorder, unspecified: F41.9

## 2011-03-05 HISTORY — DX: Migraine, unspecified, not intractable, without status migrainosus: G43.909

## 2011-03-05 HISTORY — DX: Chronic sinusitis, unspecified: J32.9

## 2011-03-05 HISTORY — DX: Torsion of ovary and ovarian pedicle, unspecified side: N83.519

## 2011-03-05 MED ORDER — IBUPROFEN 600 MG PO TABS: 600.0000 mg | ORAL_TABLET | Freq: Four times a day (QID) | ORAL | Status: AC | PRN

## 2011-03-05 MED ORDER — DOXYCYCLINE HYCLATE 100 MG PO CAPS: 100.0000 mg | ORAL_CAPSULE | Freq: Two times a day (BID) | ORAL | Status: AC

## 2011-03-05 MED ORDER — GUAIFENESIN ER 600 MG PO TB12: 600.0000 mg | ORAL_TABLET | Freq: Two times a day (BID) | ORAL | Status: DC

## 2011-03-05 NOTE — ED Provider Notes (Signed)
History     CSN: [id number]  Arrival date & time 03/05/11  1340   First MD Initiated Contact with Patient 03/05/11 1408      Chief Complaint  Patient presents with  . Chest Pain  . Facial Pain    (Consider location/radiation/quality/duration/timing/severity/associated sxs/prior treatment) HPI Comments: Patient presents with 2 problems  First :Pt with nasal congestion, postnasal drip, ST, nonproductive cough,  frontal sinus pain/pressure worse with bending forward/lying down x 5 days, ear fullness, ear pain. States that her front teeth are hurting. No fevers, N/V, other HA, bodyaches,  purulent nasal d/c. Using Flonase, saline nasal irrigation without improvement. States this feels identical  to prev episodes of sinusitis.   Second: Patient reports intermittent episodes of substernal chest pressure that goes to her left shoulder  lasting "hours" over the past few weeks.  the pain has become constant since last night. No exertional, positional component. no pleuritic pain. No nausea, vomiting, diaphoresis, palpitations, cough, wheeze, shortness of breath. She states this pain is identical to previous episodes of anxiety. Patient has had a full cardiac workup several years ago, and this was being managed by a cardiologist. Patient states that she was treated with Lexapro with improvement. 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   Patient is a 41 y.o. female presenting with chest pain and sinusitis. The history is provided by the patient. No language interpreter was used.  Chest Pain The chest pain began more than 2 weeks ago. Chest pain occurs constantly. The chest pain is unchanged. The pain is associated with stress. The quality of the pain is described as pressure-like and similar to previous episodes.  The pain does not radiate. Chest pain is worsened by stress. Pertinent negatives for primary symptoms include no shortness of breath and no palpitations. She tried nothing for the symptoms.  Her past medical history is significant for anxiety/panic attacks.  Procedure history is positive for echocardiogram and exercise treadmill test.    Sinusitis  This is a new problem. The current episode started more than 2 days ago. There has been no fever. Associated symptoms include chills, congestion, ear pain and sinus pressure. Pertinent negatives include no sweats, no hoarse voice, no sore throat, no swollen glands and no shortness of breath.    Past Medical History  Diagnosis Date  . Anxiety   . Chest pain   . Migraines   . Endometriosis   . Sinusitis   . Ovarian torsion     Past Surgical History  Procedure Date  . Abdominal surgery   . Oophrectomy   . Cesarean section     History reviewed. No pertinent family history.  History  Substance Use Topics  . Smoking status: Never Smoker   . Smokeless tobacco: Not on file  . Alcohol Use: No    OB History    Grav Para Term Preterm Abortions TAB SAB Ect Mult Living                  Review of Systems  Constitutional: Positive for chills.  HENT: Positive for ear pain, congestion and sinus pressure. Negative for sore throat and hoarse voice.   Respiratory: Negative for shortness of breath.   Cardiovascular: Positive for chest pain. Negative for palpitations.    Allergies  Codeine phosphate; Moxifloxacin; and Sulfamethoxazole  Home Medications   Current Outpatient Rx  Name Route Sig Dispense Refill  . DOXYCYCLINE HYCLATE 100 MG PO CAPS Oral Take 1 capsule (100 mg total) by mouth 2 (two) times daily. X 7 days 14 capsule 0  . FLUTICASONE PROPIONATE 50 MCG/ACT NA SUSP Nasal Place 2 sprays into the nose daily.    . GUAIFENESIN ER 600 MG PO TB12 Oral Take 1 tablet (600 mg total) by mouth 2 (two) times daily. 14 tablet 0  . IBUPROFEN  600 MG PO TABS Oral Take 1 tablet (600 mg total) by mouth every 6 (six) hours as needed for pain. 30 tablet 0    BP 119/76  Pulse 68  Temp(Src) 97.9 F (36.6 C) (Oral)  Resp 20  SpO2 98%  LMP 02/26/2011  Physical Exam  ED Course  Procedures (including critical care time)  Labs Reviewed - No data to display No results found.   1. Sinusitis   2. Chest pain       MDM  EKG: Normal sinus rhythm, Rate 65, normal axis, normal intervals. Isolated q in III. No changes compared to previous EKG from 2001.  Patient states that this chest pain is identical to previous chest pain which is caused by anxiety. Has had recent complete cardiac workup. No changes on today's EKG. EKG obtained while patient was symptomatic. Patient states she'll followup with her cardiologist within the next 2 days. Had very specific, extensive discussion on when to return to the ER. Patient agrees with plan.  No fevers >102, has had sx for < 10 days, no h/o double sickening. No historical or objective evidence of bacterial infection. No indication for abx. Will have her continue flonase, nasal saline irrigation, start mucinex, increase fluids, tylenol/motrin prn pain. Sending home with prescription for antibiotics, however, advise patient to not fill this unless she's had this for 10 days. Discussed MDM and plan with pt. Pt agrees with plan and will f/u with PMD prn.     Luiz Blare, MD 03/05/11 2200

## 2011-03-05 NOTE — ED Notes (Signed)
Sinus, ear pain , chills, dizziness, for past couple of days. Chest pressure and pain in left shoulder for past few weeks, not slept last PM. Took her m other to mental health last PM, raising 2 kids , single parent, full time student, full time employed, has to move soon; thinks her problems are due to stres

## 2011-04-08 ENCOUNTER — Ambulatory Visit (INDEPENDENT_AMBULATORY_CARE_PROVIDER_SITE_OTHER): Payer: Commercial Managed Care - PPO | Admitting: Internal Medicine

## 2011-04-08 ENCOUNTER — Encounter: Payer: Self-pay | Admitting: Internal Medicine

## 2011-04-08 VITALS — BP 120/80 | HR 115 | Temp 97.4°F | Wt 226.0 lb

## 2011-04-08 DIAGNOSIS — N9089 Other specified noninflammatory disorders of vulva and perineum: Secondary | ICD-10-CM

## 2011-04-08 DIAGNOSIS — R5381 Other malaise: Secondary | ICD-10-CM

## 2011-04-08 DIAGNOSIS — N907 Vulvar cyst: Secondary | ICD-10-CM

## 2011-04-08 DIAGNOSIS — J069 Acute upper respiratory infection, unspecified: Secondary | ICD-10-CM

## 2011-04-08 DIAGNOSIS — E049 Nontoxic goiter, unspecified: Secondary | ICD-10-CM

## 2011-04-08 DIAGNOSIS — R5383 Other fatigue: Secondary | ICD-10-CM | POA: Insufficient documentation

## 2011-04-08 HISTORY — DX: Vulvar cyst: N90.7

## 2011-04-08 LAB — CBC WITH DIFFERENTIAL/PLATELET
Basophils Absolute: 0.1 10*3/uL (ref 0.0–0.1)
Basophils Relative: 0.5 % (ref 0.0–3.0)
Eosinophils Absolute: 0.2 10*3/uL (ref 0.0–0.7)
Lymphocytes Relative: 28.8 % (ref 12.0–46.0)
MCHC: 32.9 g/dL (ref 30.0–36.0)
MCV: 91.7 fl (ref 78.0–100.0)
Monocytes Absolute: 1 10*3/uL (ref 0.1–1.0)
Neutrophils Relative %: 59 % (ref 43.0–77.0)
RBC: 4.18 Mil/uL (ref 3.87–5.11)
RDW: 13.5 % (ref 11.5–14.6)

## 2011-04-08 LAB — HEPATIC FUNCTION PANEL
Albumin: 4.1 g/dL (ref 3.5–5.2)
Bilirubin, Direct: 0 mg/dL (ref 0.0–0.3)
Total Protein: 7.1 g/dL (ref 6.0–8.3)

## 2011-04-08 LAB — LIPID PANEL
HDL: 45.8 mg/dL (ref >39.00)
Triglycerides: 192 mg/dL — ABNORMAL HIGH (ref 0.0–149.0)
VLDL: 38.4 mg/dL (ref 0.0–40.0)

## 2011-04-08 LAB — BASIC METABOLIC PANEL
BUN: 12 mg/dL (ref 6–23)
CO2: 26 mEq/L (ref 19–32)
Calcium: 9.2 mg/dL (ref 8.4–10.5)
Chloride: 105 mEq/L (ref 96–112)
Creatinine, Ser: 0.9 mg/dL (ref 0.4–1.2)
Glucose, Bld: 93 mg/dL (ref 70–99)

## 2011-04-08 LAB — LDL CHOLESTEROL, DIRECT: Direct LDL: 129.7 mg/dL

## 2011-04-08 LAB — TSH: TSH: 0.93 u[IU]/mL (ref 0.35–5.50)

## 2011-04-08 NOTE — Progress Notes (Signed)
  Subjective:    Patient ID: [name], female    DOB: 08-Mar-1970, 40 y.o.   MRN: [id number]  HPI  Patient comes in today for new problems.  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 Went to her many clinic for respiratory infection and was treated for sinusitis with Augmentin however a thyroid nodule was reported as palpated. She's coming in for followup of this  On antibiotic . This is her second course of antibiotics first with doxycycline and now is Augmentin.  She thinks that the swelling in her neck has been there about 6 weeks. Wasn't tender didn't have time to get it checked out. There is no family history of thyroid disease has a she knows.  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 Is also taking care child at home. She has a one-day history of a tender nonpitting her left labia without trauma no history of same no yeast infection. Review of Systems Negative for fever or chest pain has a dry cough no excessive swelling or bleeding. Has a GYN who left the country has not been reassigned yet had a checkup last year. No laboratory studies done in the last year.  Past history family history social history reviewed in the electronic medical record.     Objective:   Physical Exam Wt Readings from Last 3 Encounters:  04/08/11 226 lb (102.513 kg)  12/22/09 229 lb (103.874 kg)  11/06/09 227 lb (102.967 kg)  wdwn in nad mildy congested  Looks well otherwise  HEENT: Normocephalic ;atraumatic , Eyes;  PERRL, EOMs  Full, lids and conjunctiva clear,,Nose: no deformity or discharge  Mouth : OP clear without lesion or edema . NECK: Goiter noted to inspection on the right. No significant adenopathy supple  neck significantly enlarged thyroid appears to be smoothed versus nodule. Nontender. Chest:  Clear to A&P without wheezes rales or rhonchi CV:  S1-S2 no gallops or murmurs peripheral perfusion is normal Abdomen:  Sof,t normal bowel sounds without hepatosplenomegaly, no guarding rebound or masses no CVA tenderness EXT GU there is a small pea-sized reddened nodule nonfluctuant near the surface on the left lower labia. There are no ulcers discharge or adenopathy.      Assessment & Plan:  Thyroid nodule?  Feels more like assymmetrical   goiter .  Neg fam hx   Need labs and [place]   and plan followup Resolving URI treated elsewhere  Left labial nodule probably early boil use local compresses followup as appropriate. She is RPR Augmentin at present.

## 2011-04-08 NOTE — Patient Instructions (Signed)
You have a goiter enlargement of the thyroid. We will get laboratory studies today to check function and arrange for an ultrasound of the thyroid gland to check for nodules.  We'll followup depending on results.  We'll notify you of labs.  Use warm compresses 3 times a day at least to the left labial nodule it is getting very large and painful sees a gynecologist.  Lack of sleep or sleep deprivation is associated with fatigue and weight gain. Please  Pay Attention to your diet.

## 2011-04-09 LAB — THYROID ANTIBODIES: Thyroglobulin Ab: <20 U/mL (ref <40.0)

## 2011-04-12 ENCOUNTER — Ambulatory Visit
Admission: RE | Admit: 2011-04-12 | Discharge: 2011-04-12 | Disposition: A | Discharge status: Home / Self Care | Payer: Commercial Managed Care - PPO | Source: Ambulatory Visit | Attending: Internal Medicine | Admitting: Internal Medicine

## 2011-04-12 DIAGNOSIS — E049 Nontoxic goiter, unspecified: Secondary | ICD-10-CM

## 2011-04-12 DIAGNOSIS — R5383 Other fatigue: Secondary | ICD-10-CM

## 2011-04-13 ENCOUNTER — Telehealth: Payer: Self-pay | Admitting: Internal Medicine

## 2011-04-13 ENCOUNTER — Telehealth: Payer: Self-pay | Admitting: *Deleted

## 2011-04-13 NOTE — Telephone Encounter (Signed)
See result notes from lab and ultrasound.

## 2011-04-13 NOTE — Telephone Encounter (Signed)
Pt is calling back for results, please.

## 2011-04-13 NOTE — Telephone Encounter (Signed)
Patient called stating she would like to know the results of her thyroid scan and labs. Please advise.

## 2011-04-13 NOTE — Telephone Encounter (Signed)
See result notes. 

## 2011-04-13 NOTE — Telephone Encounter (Signed)
Pt. Would like a call back with results of lab work last week, and thyroid scan yesterday.

## 2011-04-14 ENCOUNTER — Other Ambulatory Visit: Payer: Self-pay | Admitting: Internal Medicine

## 2011-04-14 DIAGNOSIS — E049 Nontoxic goiter, unspecified: Secondary | ICD-10-CM

## 2011-04-14 DIAGNOSIS — E041 Nontoxic single thyroid nodule: Secondary | ICD-10-CM

## 2011-04-14 NOTE — Progress Notes (Signed)
Quick Note:  Pt aware of results and wants to go ahead with thyroid biopsy then refer to endo after the biopsy. ______

## 2011-04-21 ENCOUNTER — Ambulatory Visit
Admission: RE | Admit: 2011-04-21 | Discharge: 2011-04-21 | Disposition: A | Discharge status: Home / Self Care | Payer: Commercial Managed Care - PPO | Source: Ambulatory Visit | Attending: Internal Medicine | Admitting: Internal Medicine

## 2011-04-21 ENCOUNTER — Other Ambulatory Visit (HOSPITAL_COMMUNITY)
Admission: RE | Admit: 2011-04-21 | Discharge: 2011-04-21 | Disposition: A | Discharge status: Home / Self Care | Payer: Commercial Managed Care - PPO | Source: Ambulatory Visit | Attending: Interventional Radiology | Admitting: Interventional Radiology

## 2011-04-21 DIAGNOSIS — E041 Nontoxic single thyroid nodule: Secondary | ICD-10-CM

## 2011-04-21 DIAGNOSIS — E049 Nontoxic goiter, unspecified: Secondary | ICD-10-CM

## 2011-04-26 NOTE — Progress Notes (Signed)
Quick Note:  Pt aware ______ 

## 2011-05-10 ENCOUNTER — Encounter: Payer: Self-pay | Admitting: Internal Medicine

## 2011-05-10 ENCOUNTER — Ambulatory Visit (INDEPENDENT_AMBULATORY_CARE_PROVIDER_SITE_OTHER)
Admission: RE | Admit: 2011-05-10 | Discharge: 2011-05-10 | Disposition: A | Discharge status: Home / Self Care | Payer: Commercial Managed Care - PPO | Source: Ambulatory Visit | Attending: Internal Medicine | Admitting: Internal Medicine

## 2011-05-10 ENCOUNTER — Ambulatory Visit (INDEPENDENT_AMBULATORY_CARE_PROVIDER_SITE_OTHER): Payer: Commercial Managed Care - PPO | Admitting: Internal Medicine

## 2011-05-10 VITALS — BP 138/80 | HR 100 | Temp 98.6°F | Wt 233.0 lb

## 2011-05-10 DIAGNOSIS — R059 Cough, unspecified: Secondary | ICD-10-CM

## 2011-05-10 DIAGNOSIS — R030 Elevated blood-pressure reading, without diagnosis of hypertension: Secondary | ICD-10-CM

## 2011-05-10 DIAGNOSIS — R609 Edema, unspecified: Secondary | ICD-10-CM

## 2011-05-10 DIAGNOSIS — E049 Nontoxic goiter, unspecified: Secondary | ICD-10-CM

## 2011-05-10 DIAGNOSIS — R05 Cough: Secondary | ICD-10-CM

## 2011-05-10 LAB — CBC WITH DIFFERENTIAL/PLATELET
Basophils Relative: 0.8 % (ref 0.0–3.0)
Eosinophils Relative: 2.4 % (ref 0.0–5.0)
HCT: 36.7 % (ref 36.0–46.0)
MCV: 92.3 fl (ref 78.0–100.0)
Monocytes Absolute: 0.6 10*3/uL (ref 0.1–1.0)
Monocytes Relative: 8.3 % (ref 3.0–12.0)
Neutrophils Relative %: 48 % (ref 43.0–77.0)
RBC: 3.98 Mil/uL (ref 3.87–5.11)
WBC: 7.3 10*3/uL (ref 4.5–10.5)

## 2011-05-10 LAB — POCT URINALYSIS DIPSTICK
Bilirubin, UA: NEGATIVE
Ketones, UA: NEGATIVE
Leukocytes, UA: NEGATIVE

## 2011-05-10 LAB — BASIC METABOLIC PANEL
CO2: 26 mEq/L (ref 19–32)
Chloride: 107 mEq/L (ref 96–112)
Potassium: 3.9 mEq/L (ref 3.5–5.1)

## 2011-05-10 MED ORDER — TRIAMTERENE-HCTZ 37.5-25 MG PO TABS: 1.0000 | ORAL_TABLET | Freq: Every day | ORAL | Status: DC

## 2011-05-10 NOTE — Progress Notes (Signed)
  Subjective:    Patient ID: [name], female    DOB: [birth year], 41 y.o.   MRN: [id number]  HPI  Patient comes in today for SDA for  new problem evaluation. Swelling  Hands and feet over the weekend and had welevated bp readings.   Noted at work works with nurses,   Nausea no voimiting .   No ha weakness or neuro sx  Periods about once a month Dry hacking cough n no xob or wheezing . Feels tired no fever or chills.  Review of Systems No fever vision change  To see endocrine next week cold chills over weekend   Denies change in diet . Still has some cough dry   Past history family history social history reviewed in the electronic medical record. Outpatient Prescriptions Prior to Visit  Medication Sig Dispense Refill  . fluticasone (FLONASE) 50 MCG/ACT nasal spray Place 2 sprays into the nose daily.      Marland Kitchen amoxicillin-clavulanate (AUGMENTIN) 875-125 MG per tablet Take 1 tablet by mouth 2 (two) times daily.           Objective:   Physical Exam BP 138/80  Pulse 100  Temp(Src) 98.6 F (37 C) (Oral)  Wt 233 lb (105.688 kg)  SpO2 99%  LMP 04/14/2011 wdwn in nad HEENT no acute findings  Neck goiter thyroid nodule. Chest:  Clear to A&P without wheezes rales or rhonchi CV:  S1-S2 no gallops or murmurs peripheral perfusion is normal repeat 138- 140 /88  Abdomen:  Sof,t normal bowel sounds without hepatosplenomegaly, no guarding rebound or masses no CVA tenderness extr 2 = edema of legs ankles  Hands  Puffy no joint swelling or redness.      Assessment & Plan:  Edema   Puffiness No obv cause  Weight gain noted. Goiter with nodule prob unrelated  Will recheck lab  Borderline elevated BP  Add medication diuretic and fu  In a month. Lab today  And c xray   rov in 1 month or as needed

## 2011-05-10 NOTE — Patient Instructions (Signed)
Unsure why ou have sudden swelling .  Will recheck some labs  And x ray .and let you know results. In the meantime can add  Diuretic

## 2011-05-11 NOTE — Progress Notes (Signed)
Quick Note:  Pt aware ______ 

## 2011-05-17 ENCOUNTER — Ambulatory Visit (INDEPENDENT_AMBULATORY_CARE_PROVIDER_SITE_OTHER): Payer: Commercial Managed Care - PPO | Admitting: Endocrinology

## 2011-05-17 ENCOUNTER — Encounter: Payer: Self-pay | Admitting: Endocrinology

## 2011-05-17 VITALS — BP 122/88 | HR 92 | Temp 97.5°F | Ht 66.0 in | Wt 225.0 lb

## 2011-05-17 DIAGNOSIS — E049 Nontoxic goiter, unspecified: Secondary | ICD-10-CM

## 2011-05-17 NOTE — Patient Instructions (Signed)
Please return in 1 year, when you will be due for a repeat ultrasound and blood test. most of the time, a "lumpy thyroid" will eventually become overactive.  this is usually a slow process, happening over the span of many years.

## 2011-05-17 NOTE — Progress Notes (Signed)
Subjective:    Patient ID: [name], female    DOB: [birth year], 41 y.o.   MRN: [id number]  HPI Pt has few mos of slight swelling at the anterior neck, but no assoc pain.  Goiter was noted in the eval of uri's.  Past Medical History  Diagnosis Date  . Anxiety   . Chest pain     neg stress test summer 2010  . Migraines   . Endometriosis   . Sinusitis   . Ovarian torsion   . Allergic rhinitis     hx of testing and allergic to spring and fall f= pollens  . Anemia     nos  . GERD (gastroesophageal reflux disease)   . Endometriosis   . Hyperlipidemia   . Headache     Past Surgical History  Procedure Date  . Abdominal surgery   . Oophrectomy   . Cesarean section   . Tubal ligation     History   Social History  . Marital Status: Single    Spouse Name: N/A    Number of Children: N/A  . Years of Education: N/A   Occupational History  . Not on file.   Social History Main Topics  . Smoking status: Never Smoker   . Smokeless tobacco: Not on file  . Alcohol Use: No  . Drug Use: No  . Sexually Active:    Other Topics Concern  . Not on file   Social History Narrative   Occupation: lost job prev with LebauerBoth parents smoked32 oz of caffeine per dayLiving with friend after losing houseMoved to 2 year place with help back to school for SW2 boys children visit for 2 days at a time alternate with dadIn school deans list, last semester    Current Outpatient Prescriptions on File Prior to Visit  Medication Sig Dispense Refill  . fluticasone (FLONASE) 50 MCG/ACT nasal spray Place 2 sprays into the nose daily as needed.       . triamterene-hydrochlorothiazide (MAXZIDE-25) 37.5-25 MG per tablet Take 1 each (1 tablet total) by mouth daily.  30 tablet  3    Allergies  Allergen Reactions  . Codeine Phosphate     REACTION: unspecified  . Moxifloxacin     REACTION: orickly sensation, joint pain  . Sulfamethoxazole     REACTION: unspecified    Family History    Problem Relation Age of Onset  . Anxiety disorder Mother     mother is on xanax and apparently applying for  social security diabilty for her anxiety. t  . Coronary artery disease    . Heart attack Father   . Diabetes    . Hyperlipidemia    . Other      history of child death in family   no goiter or other thyroid problem.  BP 122/88  Pulse 92  Temp(Src) 97.5 F (36.4 C) (Oral)  Ht 5\' 6"  (1.676 m)  Wt 225 lb (102.059 kg)  BMI 36.32 kg/m2  SpO2 99%  LMP 05/11/2011  Review of Systems denies double vision, sob, diarrhea, polyuria, numbness, and easy bruising.  She has palpitations, myalgias, excessive diaphoresis, tinnitus, tremor, anxiety, headache, irreg menses, hoarseness, weight gain, rhinorrhea and edema.      Objective:   Physical Exam VS: see vs page GEN: no distress HEAD: head: no deformity eyes: no periorbital swelling, no proptosis external nose and ears are normal mouth: no lesion seen NECK: 3 cm right thyroid nodule CHEST WALL: no deformity LUNGS:  Clear  to auscultation CV: reg rate and rhythm, no murmur.   MUSCULOSKELETAL: muscle bulk and strength are grossly normal.  no obvious joint swelling.  gait is normal and steady EXTEMITIES: no deformity of the hands. no edema PULSES: dorsalis pedis intact bilat.  no carotid bruit NEURO:  cn 2-12 grossly intact.   readily moves all 4's.  sensation is intact to touch on all 4's.  No tremor SKIN:  Normal texture and temperature.  No rash or suspicious lesion is visible.  Not diaphoretic. NODES:  None palpable at the neck PSYCH: alert, oriented x3.  Does not appear anxious nor depressed.    Lab Results  Component Value Date   TSH 0.66 05/10/2011  thyroid ultrasound: A primarily solid nodule occupies much of the right lobe measuring  2.7 x 4.4 x 1.7 cm.  Bx: benign    Assessment & Plan:  Thyroid mass.  Benign adenoma. Palpitations.  Not thyroid-related. Anxiety.  This limits eval of sxs.

## 2011-05-25 NOTE — Progress Notes (Signed)
Quick Note:  Pt is aware and states she is not coughing like she was. Pt states she is gradually getting better. Pt advised if symptoms worsen to call back and ask about referral. ______

## 2011-08-09 ENCOUNTER — Encounter (HOSPITAL_COMMUNITY): Payer: Self-pay | Admitting: Emergency Medicine

## 2011-08-09 ENCOUNTER — Emergency Department (HOSPITAL_COMMUNITY)
Admission: EM | Admit: 2011-08-09 | Discharge: 2011-08-09 | Disposition: A | Discharge status: Home / Self Care | Payer: Commercial Managed Care - PPO | Attending: Emergency Medicine | Admitting: Emergency Medicine

## 2011-08-09 DIAGNOSIS — E785 Hyperlipidemia, unspecified: Secondary | ICD-10-CM | POA: Insufficient documentation

## 2011-08-09 DIAGNOSIS — J329 Chronic sinusitis, unspecified: Secondary | ICD-10-CM

## 2011-08-09 DIAGNOSIS — Z79899 Other long term (current) drug therapy: Secondary | ICD-10-CM | POA: Insufficient documentation

## 2011-08-09 DIAGNOSIS — K219 Gastro-esophageal reflux disease without esophagitis: Secondary | ICD-10-CM | POA: Insufficient documentation

## 2011-08-09 MED ORDER — AZITHROMYCIN 250 MG PO TABS: 250.0000 mg | ORAL_TABLET | Freq: Every day | ORAL | Status: AC

## 2011-08-09 NOTE — ED Provider Notes (Signed)
History     CSN: [id number]  Arrival date & time 08/09/11  3875   First MD Initiated Contact with Patient 08/09/11 1004      No chief complaint on file.   (Consider location/radiation/quality/duration/timing/severity/associated sxs/prior treatment) Patient is a 41 y.o. female presenting with URI. The history is provided by the patient.  URI The primary symptoms include ear pain, sore throat, swollen glands, cough and myalgias. Primary symptoms do not include fever, nausea or vomiting. The current episode started 6 to 7 days ago. This is a new problem.  The sore throat is not accompanied by trouble swallowing.  The swelling is not associated with trouble swallowing.  Symptoms associated with the illness include sinus pressure and congestion. Associated symptoms comments: Recurrent symptoms like previous multiple sinus infections. She has been treating with Flonase and Allegra without improvement and feels she continues to worsen, now with fatigue and mild generalized myalgia..    Past Medical History  Diagnosis Date  . Anxiety   . Chest pain     neg stress test summer 2010  . Migraines   . Endometriosis   . Sinusitis   . Ovarian torsion   . Allergic rhinitis     hx of testing and allergic to spring and fall f= pollens  . Anemia     nos  . GERD (gastroesophageal reflux disease)   . Endometriosis   . Hyperlipidemia   . Headache     Past Surgical History  Procedure Date  . Abdominal surgery   . Oophrectomy   . Cesarean section   . Tubal ligation     Family History  Problem Relation Age of Onset  . Anxiety disorder Mother     mother is on xanax and apparently applying for  social security diabilty for her anxiety. t  . Coronary artery disease    . Heart attack Father   . Diabetes    . Hyperlipidemia    . Other      history of child death in family     History  Substance Use Topics  . Smoking status: Never Smoker   . Smokeless tobacco: Not on file  . Alcohol  Use: No    OB History    Grav Para Term Preterm Abortions TAB SAB Ect Mult Living   7 2             Obstetric Comments   3 ectopic      Review of Systems  Constitutional: Negative for fever.  HENT: Positive for ear pain, congestion, sore throat and sinus pressure. Negative for trouble swallowing.   Respiratory: Positive for cough.   Gastrointestinal: Negative for nausea and vomiting.  Musculoskeletal: Positive for myalgias.    Allergies  Codeine phosphate; Moxifloxacin; and Sulfamethoxazole  Home Medications   Current Outpatient Rx  Name Route Sig Dispense Refill  . ASPIRIN-CAFFEINE 845-65 MG PO PACK Oral Take 65 mg by mouth every 8 (eight) hours as needed. Headache    . FLUTICASONE PROPIONATE 50 MCG/ACT NA SUSP Nasal Place 2 sprays into the nose daily as needed.     . TOPIRAMATE 100 MG PO TABS Oral Take 100 mg by mouth 2 (two) times daily.    . TRIAMTERENE-HCTZ 37.5-25 MG PO TABS Oral Take 1 each (1 tablet total) by mouth daily. 30 tablet 3    BP 131/79  Pulse 77  Temp 98.5 F (36.9 C) (Oral)  Resp 18  SpO2 100%  Physical Exam  Constitutional: She appears well-developed  and well-nourished.  HENT:  Head: Normocephalic.  Right Ear: External ear normal.  Left Ear: External ear normal.  Nose: Mucosal edema present. Right sinus exhibits frontal sinus tenderness. Left sinus exhibits frontal sinus tenderness.  Mouth/Throat: Oropharynx is clear and moist.  Neck: Normal range of motion. Neck supple.  Cardiovascular: Normal rate and normal heart sounds.   No murmur heard. Pulmonary/Chest: Effort normal and breath sounds normal. She has no wheezes. She has no rales.  Abdominal: Soft. Bowel sounds are normal. She exhibits no distension. There is no tenderness.  Musculoskeletal: Normal range of motion.  Lymphadenopathy:    She has no cervical adenopathy.  Skin: Skin is warm and dry. No rash noted.    ED Course  Procedures (including critical care time)  Labs Reviewed  - No data to display No results found.   No diagnosis found. 1. Sinusitis    MDM  Extended symptoms of sinusitis with history of same. Will do abx and encourage symptomatic treatment.        Rodena Medin, PA-C 08/09/11 1029

## 2011-08-09 NOTE — ED Notes (Signed)
Pt reports left ear pain 7/10. Pt has had reoccurring sinus/ear infections for several years now. No drainage noted.

## 2011-08-09 NOTE — ED Provider Notes (Signed)
Medical screening examination/treatment/procedure(s) were performed by non-physician practitioner and as supervising physician I was immediately available for consultation/collaboration.   Lyanne Co, MD 08/09/11 1039

## 2011-08-17 ENCOUNTER — Encounter: Payer: Self-pay | Admitting: Internal Medicine

## 2011-08-17 ENCOUNTER — Telehealth: Payer: Self-pay | Admitting: Internal Medicine

## 2011-08-17 ENCOUNTER — Ambulatory Visit (INDEPENDENT_AMBULATORY_CARE_PROVIDER_SITE_OTHER): Payer: Commercial Managed Care - PPO | Admitting: Internal Medicine

## 2011-08-17 VITALS — BP 100/70 | HR 74 | Temp 98.4°F | Wt 222.0 lb

## 2011-08-17 DIAGNOSIS — E041 Nontoxic single thyroid nodule: Secondary | ICD-10-CM

## 2011-08-17 DIAGNOSIS — N809 Endometriosis, unspecified: Secondary | ICD-10-CM

## 2011-08-17 DIAGNOSIS — N926 Irregular menstruation, unspecified: Secondary | ICD-10-CM

## 2011-08-17 DIAGNOSIS — R030 Elevated blood-pressure reading, without diagnosis of hypertension: Secondary | ICD-10-CM

## 2011-08-17 LAB — BASIC METABOLIC PANEL
BUN: 13 mg/dL (ref 6–23)
Chloride: 105 mEq/L (ref 96–112)
Creatinine, Ser: 0.8 mg/dL (ref 0.4–1.2)
Glucose, Bld: 76 mg/dL (ref 70–99)
Potassium: 3.4 mEq/L — ABNORMAL LOW (ref 3.5–5.1)

## 2011-08-17 LAB — POCT URINALYSIS DIP (MANUAL ENTRY)
Bilirubin, UA: NEGATIVE
Blood, UA: NEGATIVE
Glucose, UA: NEGATIVE
Nitrite, UA: NEGATIVE
Spec Grav, UA: 1.015
Urobilinogen, UA: 0.2
pH, UA: 7.5

## 2011-08-17 LAB — CBC WITH DIFFERENTIAL/PLATELET
Basophils Relative: 1.4 % (ref 0.0–3.0)
Eosinophils Relative: 2.1 % (ref 0.0–5.0)
HCT: 39.3 % (ref 36.0–46.0)
Hemoglobin: 12.8 g/dL (ref 12.0–15.0)
Lymphs Abs: 3.4 10*3/uL (ref 0.7–4.0)
MCV: 92.9 fl (ref 78.0–100.0)
Monocytes Absolute: 0.5 10*3/uL (ref 0.1–1.0)
Neutro Abs: 2.4 10*3/uL (ref 1.4–7.7)
Platelets: 359 10*3/uL (ref 150.0–400.0)
WBC: 6.5 10*3/uL (ref 4.5–10.5)

## 2011-08-17 LAB — SEDIMENTATION RATE: Sed Rate: 8 mm/hr (ref 0–22)

## 2011-08-17 LAB — HCG, QUANTITATIVE, PREGNANCY: hCG, Beta Chain, Quant, S: 0.34 m[IU]/mL

## 2011-08-17 NOTE — Patient Instructions (Signed)
Your irregular bleeding could be from endometriosis or just hormonal imbalance that is temporary.  Laboratory studies today will let you know these results.   We need to help you arrange a new gynecologist in the meantime.  Your ears look normal today. You can decrease the Maxzide to half a pill a day for fluid.

## 2011-08-17 NOTE — Progress Notes (Signed)
Subjective:    Patient ID: [name], female    DOB: [birth year], 41 y.o.   MRN: [id number]  HPI Patient comes in today for SDA for  new problem evaluation.  Concerned about irregular periods her last period was July 3 through the ninth and then on July 21. She states it was due 8/1. Bleeding as read She has had a long-standing problem with endometriosis and has a remote history of 7 laparoscopic surgeries she was treated with an IUD for while with Dr. Edward Jolly and his had it out for about a year. Since that time her periods have been pretty regular monthly normal days until this past month.  She has had a tubal ligation in the past and a history of ectopic pregnancy on the right unsure if she had a salpingectomy.  She has some breast tenderness in the nausea some headache and low back cramping that she usually doesn't get with her periods because of this she was worried about an ectopic pregnancy or other concerns. Her regular OB/GYN has left the country and she has not established with a new one yet. Patient is just getting over a sinus infection where she was seen by urgent care and given a Z-Pak. She is day 6 into this. Still has some sinus congestion but no associated fever. She has some nausea   Review of Systems No fever chest pain shortness of breath unusual bleeding elsewhere she is under surveillance for thyroid nodular with Dr. Everardo All. She is taking a diuretic for fluid and blood pressure but since her blood pressure is low asks for other sideration she has headaches and is on Topamax with some help.  Past history family history social history reviewed in the electronic medical record. mom with dm and ht  Outpatient Encounter Prescriptions as of 08/17/2011  Medication Sig Dispense Refill  . Aspirin-Caffeine (BC FAST PAIN RELIEF) 845-65 MG PACK Take 65 mg by mouth every 8 (eight) hours as needed. Headache      . fluticasone (FLONASE) 50 MCG/ACT nasal spray Place 2 sprays into the  nose daily as needed.       . topiramate (TOPAMAX) 100 MG tablet Take 100 mg by mouth 2 (two) times daily.      Marland Kitchen triamterene-hydrochlorothiazide (MAXZIDE-25) 37.5-25 MG per tablet Take 1 each (1 tablet total) by mouth daily.  30 tablet  3       Objective:   Physical Exam BP 100/70  Pulse 74  Temp 98.4 F (36.9 C) (Oral)  Wt 222 lb (100.699 kg)  SpO2 98% HEENT: Normocephalic ;atraumatic , Eyes;  PERRL, EOMs  Full, lids and conjunctiva clear,,Ears: no deformities, canals nl, TM landmarks normal, Nose: no deformity or discharge  Mouth : OP clear without lesion or edema . Neck thyroid enlarged non tender Chest:  Clear to Awithout wheezes rales or rhonchi CV:  S1-S2 no gallops or murmurs peripheral perfusion is normal Abdomen:  Sof,t normal bowel sounds without hepatosplenomegaly, no guarding rebound or masses no CVA tenderness area lower mid of concern no g or r     Assessment & Plan:   Irregular period abnormal. With different back cramps  Although patient has had a BTL she is concerned about abnormal pregnancy with her past history we'll go ahead and do a beta hCG but this may be secondary to endometriosis or other some hormonal aberrations that could even be temporary.  On review of her record her last pelvic ultrasound was about a year ago and  did show a complex ovarian cyst that needed followup unclear what followup it had.  We'll do laboratory testing today advise she establish with gynecology as her previous Dr. Edward Jolly has left the area.   In regard to her Maxzide she can decrease it to a half a day wants to stay on it because of swelling blood pressure appears to be controlled or on the low side  Upper respiratory infection treated with azithromycin appears to be improving.

## 2011-08-17 NOTE — Telephone Encounter (Signed)
Caller: Ciaira/Patient; PCP: Madelin Headings.; CB#: [phone]; Call regarding Abnormal Period; LMP (normal) 07/28/11; Bleeding onset 08/15/11.  Sexually active and does not use birth control.    Cramping in back rated at 7 of 10 and nausea.  Relates hx of no tube on either side due to tubal ligation on left and 3 ectopic pregnancies on right.  Nagging headache reported.  Other emergent sx ruled out.  Home care for the interim and parameters for callback.  Appt with Dr. Fabian Sharp at 11:15. Scheduled for 30 in to allow time for pelvic exam.

## 2011-08-18 NOTE — Telephone Encounter (Signed)
Pt was given results of her labs.  She has been seen in the office.

## 2011-08-18 NOTE — Telephone Encounter (Signed)
Pt called req to get lab results. Pls call.  °

## 2011-11-19 ENCOUNTER — Telehealth: Payer: Self-pay | Admitting: Internal Medicine

## 2011-11-19 NOTE — Telephone Encounter (Signed)
Pt is sch to come in for Mammogram today at 5:15pm. Need to get pts insurance info. Hospital has tried contacting pt, but was unsuccessful.  Fax # [phone].

## 2011-11-19 NOTE — Telephone Encounter (Signed)
Please fax info in the chart or send to medical records.  Thanks!!!

## 2011-11-26 ENCOUNTER — Encounter: Payer: Self-pay | Admitting: Internal Medicine

## 2011-11-30 ENCOUNTER — Encounter: Payer: Self-pay | Admitting: Internal Medicine

## 2011-12-06 ENCOUNTER — Encounter: Payer: Self-pay | Admitting: Internal Medicine

## 2011-12-06 ENCOUNTER — Ambulatory Visit (INDEPENDENT_AMBULATORY_CARE_PROVIDER_SITE_OTHER): Payer: Commercial Managed Care - PPO | Admitting: Internal Medicine

## 2011-12-06 VITALS — BP 110/82 | HR 63 | Temp 98.4°F | Wt 222.0 lb

## 2011-12-06 DIAGNOSIS — Z733 Stress, not elsewhere classified: Secondary | ICD-10-CM

## 2011-12-06 DIAGNOSIS — F411 Generalized anxiety disorder: Secondary | ICD-10-CM

## 2011-12-06 DIAGNOSIS — F419 Anxiety disorder, unspecified: Secondary | ICD-10-CM

## 2011-12-06 DIAGNOSIS — G43009 Migraine without aura, not intractable, without status migrainosus: Secondary | ICD-10-CM

## 2011-12-06 DIAGNOSIS — N809 Endometriosis, unspecified: Secondary | ICD-10-CM

## 2011-12-06 DIAGNOSIS — J329 Chronic sinusitis, unspecified: Secondary | ICD-10-CM

## 2011-12-06 DIAGNOSIS — F439 Reaction to severe stress, unspecified: Secondary | ICD-10-CM

## 2011-12-06 DIAGNOSIS — R51 Headache: Secondary | ICD-10-CM

## 2011-12-06 DIAGNOSIS — N760 Acute vaginitis: Secondary | ICD-10-CM

## 2011-12-06 DIAGNOSIS — R030 Elevated blood-pressure reading, without diagnosis of hypertension: Secondary | ICD-10-CM

## 2011-12-06 LAB — BASIC METABOLIC PANEL
BUN: 10 mg/dL (ref 6–23)
CO2: 24 mEq/L (ref 19–32)
Calcium: 8.8 mg/dL (ref 8.4–10.5)
Chloride: 108 mEq/L (ref 96–112)
Creatinine, Ser: 0.9 mg/dL (ref 0.4–1.2)

## 2011-12-06 MED ORDER — METRONIDAZOLE 500 MG PO TABS: 500.0000 mg | ORAL_TABLET | Freq: Two times a day (BID) | ORAL | Status: DC

## 2011-12-06 MED ORDER — CITALOPRAM HYDROBROMIDE 20 MG PO TABS: ORAL_TABLET | ORAL | Status: DC

## 2011-12-06 MED ORDER — TRIAMTERENE-HCTZ 37.5-25 MG PO TABS: 0.5000 | ORAL_TABLET | Freq: Every day | ORAL | Status: DC

## 2011-12-06 NOTE — Progress Notes (Signed)
Chief Complaint  Patient presents with  . Stress    Can't sleep.  Believes she has a sinus infection and bacterial vaginosis.  Migraines for four weeks.  . Otalgia  . Sore Throat  . Vaginal Discharge  . Migraine    HPI:  Pt comes in today for a number of complaints but the most concerning to her is Anxiety  Triggering  Migtaines.  Topamax.   Taking  And helping but  Thinks mood is aggravating it. Irritable and stressed mom was helping caretaker and she moved back to [facility]. Has less social support now.   HA s seen by neuro Dr Sandria Manly  Increasing  Using otc migraine med? With caffiene  Vaginal irritation odor and week. No itching some irritation and dc  Has seen gyne following . Hx of bv   No sti risk   Has allergy sinus congestion but no fever some sore throat and ear discomfort ears pop.  BP  Needs refill of med today taking 1/2 pill  ROS: See pertinent positives and negatives per HPI. No uti sx vision changes syncope new rashes   Past Medical History  Diagnosis Date  . Anxiety   . Chest pain     neg stress test summer 2010  . Migraines   . Endometriosis   . Sinusitis   . Ovarian torsion   . Allergic rhinitis     hx of testing and allergic to spring and fall f= pollens  . Anemia     nos  . GERD (gastroesophageal reflux disease)   . Endometriosis   . Hyperlipidemia   . Headache     Family History  Problem Relation Age of Onset  . Anxiety disorder Mother     mother is on xanax and apparently applying for  social security diabilty for her anxiety. t  . Coronary artery disease    . Heart attack Father   . Diabetes    . Hyperlipidemia    . Other      history of child death in family     History   Social History  . Marital Status: Single    Spouse Name: N/A    Number of Children: N/A  . Years of Education: N/A   Social History Main Topics  . Smoking status: Never Smoker   . Smokeless tobacco: None  . Alcohol Use: No  . Drug Use: No  . Sexually Active:     Other Topics Concern  . None   Social History Narrative   Occupation: lost job prev with [employer]Both parents smoked32 oz of caffeine per [place] with friend after losing houseMoved to 2 year place with help back to school for SW2 boys children visit for 2 days at a time alternate with dadWorks  [employer]  [employer] [employer].   And graduated as SW.   Cant find job at this time in [facility] moved back home to [place] and has stress with chid care    Works  [employer]  Can[employer].   And graduated as SW.  Sleep.    EXAM:  BP 110/82  Pulse 63  Temp 98.4 F (36.9 C) (Oral)  Wt 222 lb (100.699 kg)  SpO2 98%  LMP 11/22/2011  There is no height on file to calculate BMI.  GENERAL: vitals reviewed and listed above, alert, oriented, appears well hydrated and in no acute distress  HEENT: atraumatic, conjunctiva  clear, no obvious abnormalities on inspection of external nose and ears OP :  no lesion edema or exudate  Nasal congestion no sig tenderness  Tm not bulging  Or red  NECK: no obvious masses on inspection palpation   LUNGS: clear to auscultation bilaterally, no wheezes, rales or rhonchi, good air movement  CV: HRRR, no clubbing cyanosis or  peripheral edema nl cap refill   MS: moves all extremities without noticeable focal  Abnormality EXT GU no rash no adenopathy  cx clear homogeneous grey dc no ulcers or lesions   PSYCH: pleasant and cooperative, stressed  Good eye contact  A bit anxious  ASSESSMENT AND PLAN:  Discussed the following assessment and plan:  1. Anxiety  Basic metabolic panel, Magnesium, TSH   add ssri for now and fu  as discussed   2. SINUSITIS, RECURRENT  Basic metabolic panel, Magnesium, TSH   poss allergic  trigger  3. MIGRAINE, COMMON  Basic metabolic panel, Magnesium, TSH   hx of some seen by neuro other triggers   4. Vaginitis  Basic metabolic panel, Magnesium, TSH   poss bv by exam and context can rx for such if not better see gyne  5. Stress  Basic  metabolic panel, Magnesium, TSH  6. Headache  Basic metabolic panel, Magnesium, TSH  7. Elevated blood pressure reading  Basic metabolic panel, Magnesium, TSH   medication and fu as appropriate   Should see her gyne if needed and Neuro about has if needed advised FMLA form should go to  Dr Sandria Manly or ones managing her HAs  -Patient advised to return or notify health care team  immediately if symptoms worsen or persist or new concerns arise.  Patient Instructions  Take med for vaginitis increase flonase to twice a day for now. Track the use of the caffiene med as this can add to anxiety .   Begin 10 mg celexa 10 mg and increase to 20 mg for anxiety.   ROV  in about 3 weeks or so  Or as needed.   Total visit > 50% spent counseling and coordinating care    [place] K. Angie Hogg M.D.

## 2011-12-06 NOTE — Patient Instructions (Addendum)
Take med for vaginitis increase flonase to twice a day for now. Track the use of the caffiene med as this can add to anxiety .   Begin 10 mg celexa 10 mg and increase to 20 mg for anxiety.   ROV  in about 3 weeks or so  Or as needed.

## 2011-12-11 ENCOUNTER — Encounter: Payer: Self-pay | Admitting: Internal Medicine

## 2011-12-11 DIAGNOSIS — F419 Anxiety disorder, unspecified: Secondary | ICD-10-CM | POA: Insufficient documentation

## 2011-12-11 DIAGNOSIS — F439 Reaction to severe stress, unspecified: Secondary | ICD-10-CM | POA: Insufficient documentation

## 2011-12-29 ENCOUNTER — Other Ambulatory Visit: Payer: Self-pay | Admitting: *Deleted

## 2011-12-29 MED ORDER — FLUTICASONE PROPIONATE 50 MCG/ACT NA SUSP: 2.0000 | Freq: Every day | NASAL | Status: DC | PRN

## 2012-01-07 ENCOUNTER — Emergency Department (HOSPITAL_COMMUNITY)
Admission: EM | Admit: 2012-01-07 | Discharge: 2012-01-07 | Disposition: A | Discharge status: Home / Self Care | Payer: Commercial Managed Care - PPO | Attending: Emergency Medicine | Admitting: Emergency Medicine

## 2012-01-07 ENCOUNTER — Encounter (HOSPITAL_COMMUNITY): Payer: Self-pay | Admitting: Emergency Medicine

## 2012-01-07 DIAGNOSIS — Z8719 Personal history of other diseases of the digestive system: Secondary | ICD-10-CM | POA: Insufficient documentation

## 2012-01-07 DIAGNOSIS — E785 Hyperlipidemia, unspecified: Secondary | ICD-10-CM | POA: Insufficient documentation

## 2012-01-07 DIAGNOSIS — R209 Unspecified disturbances of skin sensation: Secondary | ICD-10-CM | POA: Insufficient documentation

## 2012-01-07 DIAGNOSIS — Z79899 Other long term (current) drug therapy: Secondary | ICD-10-CM | POA: Insufficient documentation

## 2012-01-07 DIAGNOSIS — Z87448 Personal history of other diseases of urinary system: Secondary | ICD-10-CM | POA: Insufficient documentation

## 2012-01-07 DIAGNOSIS — F411 Generalized anxiety disorder: Secondary | ICD-10-CM | POA: Insufficient documentation

## 2012-01-07 DIAGNOSIS — J329 Chronic sinusitis, unspecified: Secondary | ICD-10-CM

## 2012-01-07 DIAGNOSIS — G43909 Migraine, unspecified, not intractable, without status migrainosus: Secondary | ICD-10-CM | POA: Insufficient documentation

## 2012-01-07 DIAGNOSIS — Z862 Personal history of diseases of the blood and blood-forming organs and certain disorders involving the immune mechanism: Secondary | ICD-10-CM | POA: Insufficient documentation

## 2012-01-07 DIAGNOSIS — Z8742 Personal history of other diseases of the female genital tract: Secondary | ICD-10-CM | POA: Insufficient documentation

## 2012-01-07 DIAGNOSIS — R51 Headache: Secondary | ICD-10-CM | POA: Insufficient documentation

## 2012-01-07 DIAGNOSIS — G43109 Migraine with aura, not intractable, without status migrainosus: Secondary | ICD-10-CM

## 2012-01-07 MED ORDER — VALPROATE SODIUM 500 MG/5ML IV SOLN: 500.0000 mg | Freq: Once | INTRAVENOUS | Status: DC

## 2012-01-07 MED ORDER — SODIUM CHLORIDE 0.9 % IV BOLUS (SEPSIS)
1000.0000 mL | Freq: Once | INTRAVENOUS | Status: AC
  Administered 2012-01-07: 1000 mL via INTRAVENOUS

## 2012-01-07 MED ORDER — ONDANSETRON HCL 4 MG/2ML IJ SOLN
4.0000 mg | Freq: Once | INTRAMUSCULAR | Status: AC
  Administered 2012-01-07: 4 mg via INTRAVENOUS
  Filled 2012-01-07: qty 2

## 2012-01-07 MED ORDER — AMOXICILLIN 500 MG PO CAPS: 1000.0000 mg | ORAL_CAPSULE | Freq: Two times a day (BID) | ORAL | Status: AC

## 2012-01-07 MED ORDER — NON FORMULARY: 500.0000 mg | Freq: Once | Status: DC

## 2012-01-07 MED ORDER — VALPROATE SODIUM 500 MG/5ML IV SOLN
Freq: Once | INTRAVENOUS | Status: AC
  Administered 2012-01-07: 20:00:00 via INTRAVENOUS
  Filled 2012-01-07: qty 5

## 2012-01-07 MED ORDER — VALPROATE SODIUM 500 MG/5ML IV SOLN
500.0000 mg | Freq: Once | INTRAVENOUS | Status: DC
  Filled 2012-01-07: qty 5

## 2012-01-07 MED ORDER — VALPROATE SODIUM 500 MG/5ML IV SOLN
500.0000 mg | Freq: Once | INTRAVENOUS | Status: DC
  Administered 2012-01-07: 500 mg via INTRAVENOUS
  Filled 2012-01-07: qty 5

## 2012-01-07 NOTE — ED Notes (Signed)
Reports migraine headache for 3 weeks. Onset around 1200pm numbness to left side of face, left arm, with the 4th & middle finger the worse. Today  woke up with nausea. Last time seen neurologist 6mon. Ago.  Stated called office no response.

## 2012-01-07 NOTE — ED Notes (Signed)
Pt c/o migraine and possible sinus infection.  Pt reports cyclic migraine headache for the past three weeks. Pt reports the migraine has worsened over the past 48 hours.  Pt reports nausea and numbness on the left side.  Pt denies vomiting and fever.

## 2012-01-07 NOTE — ED Provider Notes (Signed)
History     CSN: [id number]  Arrival date & time 01/07/12  1438   First MD Initiated Contact with Patient 01/07/12 1706      Chief Complaint  Patient presents with  . Migraine  . Recurrent Sinusitis    (Consider location/radiation/quality/duration/timing/severity/associated sxs/prior treatment) HPI  [name] is a 41 y.o. female complaining of migraine exacerbation typical of her prior episodes. She states she presents today because it has been going on for 3 weeks and knows that at this point she is going to need intervention to break the headache. She states that normally I be Depakote works well for her. She also says that she has some sinus pressure and feels that she may have a sinus infection. She describes the headache as 8/10 pressure-like, located on the left frontal, associated with numbness to the left side of face and left arm with nausea.  Past Medical History  Diagnosis Date  . Anxiety   . Chest pain     neg stress test summer 2010  . Migraines   . Endometriosis   . Sinusitis   . Ovarian torsion   . Allergic rhinitis     hx of testing and allergic to spring and fall f= pollens  . Anemia     nos  . GERD (gastroesophageal reflux disease)   . Endometriosis   . Hyperlipidemia   . Headache   . Labial cyst 04/08/2011    early infection use hotpcompresses      Past Surgical History  Procedure Date  . Abdominal surgery   . Oophrectomy   . Cesarean section   . Tubal ligation   . Unilateral salpingectomy     right  age 50 ectopics.    Family History  Problem Relation Age of Onset  . Anxiety disorder Mother     mother is on xanax and apparently applying for  social security diabilty for her anxiety. t  . Coronary artery disease    . Heart attack Father   . Diabetes    . Hyperlipidemia    . Other      history of child death in family     History  Substance Use Topics  . Smoking status: Never Smoker   . Smokeless tobacco: Not on file  .  Alcohol Use: No    OB History    Grav Para Term Preterm Abortions TAB SAB Ect Mult Living   7 2             Obstetric Comments   3 ectopic      Review of Systems  Constitutional: Negative for fever.  HENT: Positive for ear pain and sinus pressure.   Respiratory: Negative for shortness of breath.   Cardiovascular: Negative for chest pain.  Gastrointestinal: Negative for nausea, vomiting, abdominal pain and diarrhea.  Neurological: Positive for numbness and headaches.  All other systems reviewed and are negative.    Allergies  Codeine phosphate; Moxifloxacin; and Sulfamethoxazole  Home Medications   Current Outpatient Rx  Name  Route  Sig  Dispense  Refill  . ASPIRIN-CAFFEINE 845-65 MG PO PACK   Oral   Take 65 mg by mouth every 8 (eight) hours as needed. Headache         . CITALOPRAM HYDROBROMIDE 20 MG PO TABS   Oral   Take 20 mg by mouth daily.         Marland Kitchen FLUTICASONE PROPIONATE 50 MCG/ACT NA SUSP   Nasal   Place 2  sprays into the nose daily as needed.   16 g   2   . TOPIRAMATE 100 MG PO TABS   Oral   Take 100 mg by mouth 2 (two) times daily.         . TRIAMTERENE-HCTZ 37.5-25 MG PO TABS   Oral   Take 0.5 tablets by mouth daily.           BP 119/76  Pulse 76  Temp 98.1 F (36.7 C) (Oral)  Ht 5\' 5"  (1.651 m)  Wt 222 lb (100.699 kg)  BMI 36.94 kg/m2  SpO2 97%  Physical Exam  Nursing note and vitals reviewed. Constitutional: She is oriented to person, place, and time. She appears well-developed and well-nourished. No distress.  HENT:  Head: Normocephalic.       Tenderness to palpation of bilateral maxillary sinuses. Bilateral nasal mucosa edematous and erythematous  Eyes: Conjunctivae normal and EOM are normal. Pupils are equal, round, and reactive to light.  Neck: Normal range of motion.  Cardiovascular: Normal rate, regular rhythm and intact distal pulses.  Exam reveals no gallop and no friction rub.   No murmur heard. Pulmonary/Chest:  Effort normal. No stridor. No respiratory distress. She has no wheezes. She has no rales. She exhibits no tenderness.  Abdominal: Soft. Bowel sounds are normal. She exhibits no distension and no mass. There is no tenderness. There is no rebound and no guarding.  Musculoskeletal: Normal range of motion.  Lymphadenopathy:    She has cervical adenopathy.  Neurological: She is alert and oriented to person, place, and time.       Strength is 5 out of 5x4 extremities, patient and relates with a coordinated gait, distal sensation is grossly intact.  Psychiatric: She has a normal mood and affect.    ED Course  Procedures (including critical care time)  Labs Reviewed - No data to display No results found.  7:24 PM patient's head is now 5/10 down from 8/10. I will give her one more run of Depakote.   1. Complicated migraine   2. Sinusitis       MDM  Patient with typical headache exacerbation complicated by sinusitis.  Consult from neurologist Dr. love stay she has a history of complicated migraines and recommends giving her Depakote IV push.   Patient feels significantly better after second dose of Depakote.   Discussed case with attending who agrees with plan and stability to d/c to home.    Pt verbalized understanding and agrees with care plan. Outpatient follow-up and return precautions given.    New Prescriptions   AMOXICILLIN (AMOXIL) 500 MG CAPSULE    Take 2 capsules (1,000 mg total) by mouth 2 (two) times daily.        Wynetta Emery, PA-C 01/07/12 2249

## 2012-01-09 NOTE — ED Provider Notes (Signed)
Medical screening examination/treatment/procedure(s) were performed by non-physician practitioner and as supervising physician I was immediately available for consultation/collaboration.  Shacarra Choe T Hoa Deriso, MD 01/09/12 1521 

## 2012-03-11 ENCOUNTER — Other Ambulatory Visit: Payer: Self-pay

## 2012-04-25 ENCOUNTER — Telehealth: Payer: Self-pay | Admitting: Internal Medicine

## 2012-04-25 MED ORDER — CITALOPRAM HYDROBROMIDE 20 MG PO TABS: 20.0000 mg | ORAL_TABLET | Freq: Every day | ORAL | Status: DC

## 2012-04-25 NOTE — Telephone Encounter (Signed)
Pt needs new rx citalopram 20 mg call into new pharm [facility]

## 2012-04-25 NOTE — Telephone Encounter (Signed)
I refilled this for her.  She should have returned in 3 weeks from her last appt for a med check.  Please call the patient and schedule an appt.  I gave her a 30 day supply.

## 2012-04-25 NOTE — Telephone Encounter (Signed)
PT IS [facility] FOR 05-04-12

## 2012-05-04 ENCOUNTER — Encounter: Payer: Self-pay | Admitting: Internal Medicine

## 2012-05-04 ENCOUNTER — Ambulatory Visit (INDEPENDENT_AMBULATORY_CARE_PROVIDER_SITE_OTHER): Payer: Self-pay | Admitting: Internal Medicine

## 2012-05-04 VITALS — BP 134/82 | HR 100 | Temp 98.5°F | Wt 231.0 lb

## 2012-05-04 DIAGNOSIS — F419 Anxiety disorder, unspecified: Secondary | ICD-10-CM

## 2012-05-04 DIAGNOSIS — J329 Chronic sinusitis, unspecified: Secondary | ICD-10-CM

## 2012-05-04 DIAGNOSIS — J069 Acute upper respiratory infection, unspecified: Secondary | ICD-10-CM

## 2012-05-04 DIAGNOSIS — F411 Generalized anxiety disorder: Secondary | ICD-10-CM

## 2012-05-04 DIAGNOSIS — J309 Allergic rhinitis, unspecified: Secondary | ICD-10-CM

## 2012-05-04 MED ORDER — CITALOPRAM HYDROBROMIDE 20 MG PO TABS: 20.0000 mg | ORAL_TABLET | Freq: Every day | ORAL | Status: DC

## 2012-05-04 NOTE — Patient Instructions (Signed)
Agree stay on the celexa    Plan visit preventive with labs in about 6 months or so .  This appears to be a viral respiratory infection expect cough the last couple weeks but the sinus pressure should be getting better in the next 5 days. You can take a daytime decongestant saline nose spray clear fluids.   If you're fever isn't gone next week or U. getting severe pain contact our office. Consider treatment for bacterial sinusitis.

## 2012-05-04 NOTE — Progress Notes (Signed)
Chief Complaint  Patient presents with  . Follow-up    Meds.  Is not feeling well today.  She has a cough, fever, sore throat, ear pain and she has coughed so much her chest hurt.  The drainage she has is clear.  Sx started on Saturday.  Missed work on Monday and would like a note if possible.    HPI: Patient comes in today for  problem evaluation. Last ov with me was 11/13  For  anxety  celxa has been very helpful has better   Dec cramps and feels well on this medication Has a new job navigator day hours .  Feels much better headaches are better  recnet uri sx with fever 100.7 achy congetsion  Some sinus pressure onset 4 days ago cry cough no sob hemoptysis  Missed work 3 days ago and here today . No chest pain shortness of breath  Takes 1./2 pull diuretic  For edema   . Las t labs fall December .  flonase burns nose with this illness  ROS: See pertinent positives and negatives per HPI.  Past Medical History  Diagnosis Date  . Anxiety   . Chest pain     neg stress test summer 2010  . Migraines   . Endometriosis   . Sinusitis   . Ovarian torsion   . Allergic rhinitis     hx of testing and allergic to spring and fall f= pollens  . Anemia     nos  . GERD (gastroesophageal reflux disease)   . Endometriosis   . Hyperlipidemia   . Headache   . Labial cyst 04/08/2011    early infection use hotpcompresses      Family History  Problem Relation Age of Onset  . Anxiety disorder Mother     mother is on xanax and apparently applying for  social security diabilty for her anxiety. t  . Coronary artery disease    . Heart attack Father   . Diabetes    . Hyperlipidemia    . Other      history of child death in family     History   Social History  . Marital Status: Single    Spouse Name: N/A    Number of Children: N/A  . Years of Education: N/A   Social History Main Topics  . Smoking status: Never Smoker   . Smokeless tobacco: None  . Alcohol Use: No  . Drug Use: No  .  Sexually Active:    Other Topics Concern  . 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   And graduated as SW.   Cant find job at this time in SW   Mom moved back home to [place] and has stress with chid care       Now working as a Therapist, occupational care daytime doing well    Outpatient Encounter Prescriptions as of 05/04/2012  Medication Sig Dispense Refill  . Aspirin-Caffeine (BC FAST PAIN RELIEF) 845-65 MG PACK Take 65 mg by mouth every 8 (eight) hours as needed. Headache      . citalopram (CELEXA) 20  MG tablet Take 1 tablet (20 mg total) by mouth daily.  90 tablet  2  . fluticasone (FLONASE) 50 MCG/ACT nasal spray Place 2 sprays into the nose daily as needed.  16 g  2  . triamterene-hydrochlorothiazide (MAXZIDE-25) 37.5-25 MG per tablet Take 0.5 tablets by mouth daily.      . [DISCONTINUED] citalopram (CELEXA) 20 MG tablet Take 1 tablet (20 mg total) by mouth daily.  30 tablet  0  . [DISCONTINUED] topiramate (TOPAMAX) 100 MG tablet Take 100 mg by mouth 2 (two) times daily.       No facility-administered encounter medications on file as of 05/04/2012.    EXAM:  BP 134/82  Pulse 100  Temp(Src) 98.5 F (36.9 C) (Oral)  Wt 231 lb (104.781 kg)  BMI 38.44 kg/m2  SpO2 98%  LMP 05/02/2012  Body mass index is 38.44 kg/(m^2).  WDWN in NAD  quiet respirations; mildly congested  somewhat hoarse. Non toxic . HEENT: Normocephalic ;atraumatic , Eyes;  PERRL, EOMs  Full, lids and conjunctiva clear,,Ears: no deformities, canals nl, TM landmarks normal, Nose: no deformity or discharge but congested;face minimally tender Mouth : OP clear without lesion or edema . Neck: Supple without adenopathy or masses or  bruits Chest:  Clear to A&P without wheezes rales or rhonchi CV:  S1-S2 no gallops or murmurs peripheral perfusion is normal Skin :nl perfusion and no acute rashes   MS: moves all extremities without noticeable focal  abnormality  PSYCH: pleasant and cooperative, no obvious depression or anxiety  ASSESSMENT AND PLAN:  Discussed the following assessment and plan:  Viral upper respiratory tract infection with cough - sinusits involved sx rx for now and expectant management   note for work feer should resolve soon alarm sx reviewed   SINUSITIS, RECURRENT  ALLERGIC RHINITIS  Anxiety Doing well on medication continue the Celexa plan followup in 6 months or so with full blood panel and checkup. -Patient advised to return or notify health care team  if symptoms worsen or persist or new concerns arise.  Patient Instructions  Agree stay on the celexa    Plan visit preventive with labs in about 6 months or so .  This appears to be a viral respiratory infection expect cough the last couple weeks but the sinus pressure should be getting better in the next 5 days. You can take a daytime decongestant saline nose spray clear fluids.   If you're fever isn't gone next week or U. getting severe pain contact our office. Consider treatment for bacterial sinusitis.     Neta Mends. Frances Joynt M.D.

## 2012-05-22 ENCOUNTER — Other Ambulatory Visit: Payer: Self-pay | Admitting: Internal Medicine

## 2012-08-29 ENCOUNTER — Telehealth: Payer: Self-pay | Admitting: Internal Medicine

## 2012-08-29 NOTE — Telephone Encounter (Signed)
Can  work her in around 11 45 tomorrow but only for the sore throat may need to have another ov  if needs evaluation for multiple issues.

## 2012-08-29 NOTE — Telephone Encounter (Signed)
Patient Information:  Caller Name: [facility] Cell [phone])  Phone: [phone]  Patient: [name], [name]  Gender: Female  DOB: [birth year]  Age: 42 Years  PCP: Berniece Andreas [facility])  Pregnant: No  Office Follow Up:  Does the office need to follow up with this patient?: Yes  Instructions For The Office: PLS READ RN NOTE  RN Note:  Goiter history, weight gain 18 lbs, onset 1 month, chills, sore throat, fatigue x1 week. Pt takes no thyroid meds.  Pt denies breathing, swallowing problems or rapid heart rate. No same day or next appts available w/ Dr Fabian Sharp, Pt doesn't wish to see another MD.  PLEASE REVIEW W/ MD IF PT CAN BE FIT IN ON 8-6 AND F/U W/ PT ON CELL [phone].  Symptoms  Reason For Call & Symptoms: Goiter history, weight gain 18 lbs, onset 1 month, chills, sore throat, fatigue x1 week  Reviewed Health History In EMR: Yes  Reviewed Medications In EMR: Yes  Reviewed Allergies In EMR: Yes  Reviewed Surgeries / Procedures: Yes  Date of Onset of Symptoms: 07/29/2012 OB / GYN:  LMP: 08/21/2012  Guideline(s) Used:  Sore Throat  Disposition Per Guideline:   Strep Test Only Visit Today or Tomorrow  Reason For Disposition Reached:   Sore throat is the main symptom and persists > 48 hours  Advice Given:  N/A  Patient Will Follow Care Advice:  YES

## 2012-08-30 NOTE — Telephone Encounter (Signed)
Left message on cell and work number informing her that she has been placed on the scheduled to see [facility] on 08/31/12 @ 9:15.  Other slot authorized by [facility] was taken.

## 2012-08-30 NOTE — Telephone Encounter (Signed)
Instructed to call back if this time did not work for her.

## 2012-08-31 ENCOUNTER — Ambulatory Visit: Payer: Self-pay | Admitting: Internal Medicine

## 2012-10-17 ENCOUNTER — Emergency Department (HOSPITAL_COMMUNITY)
Admission: EM | Admit: 2012-10-17 | Discharge: 2012-10-17 | Disposition: A | Discharge status: Home / Self Care | Payer: BC Managed Care – PPO | Attending: Emergency Medicine | Admitting: Emergency Medicine

## 2012-10-17 ENCOUNTER — Encounter (HOSPITAL_COMMUNITY): Payer: Self-pay | Admitting: Emergency Medicine

## 2012-10-17 ENCOUNTER — Emergency Department (HOSPITAL_BASED_OUTPATIENT_CLINIC_OR_DEPARTMENT_OTHER)
Admission: EM | Admit: 2012-10-17 | Discharge: 2012-10-17 | Disposition: A | Discharge status: Home / Self Care | Payer: BC Managed Care – PPO | Attending: Emergency Medicine | Admitting: Emergency Medicine

## 2012-10-17 ENCOUNTER — Telehealth: Payer: Self-pay | Admitting: Neurology

## 2012-10-17 ENCOUNTER — Telehealth: Payer: Self-pay | Admitting: Internal Medicine

## 2012-10-17 ENCOUNTER — Encounter (HOSPITAL_BASED_OUTPATIENT_CLINIC_OR_DEPARTMENT_OTHER): Payer: Self-pay | Admitting: Emergency Medicine

## 2012-10-17 DIAGNOSIS — R29810 Facial weakness: Secondary | ICD-10-CM | POA: Insufficient documentation

## 2012-10-17 DIAGNOSIS — R209 Unspecified disturbances of skin sensation: Secondary | ICD-10-CM | POA: Insufficient documentation

## 2012-10-17 DIAGNOSIS — Z8742 Personal history of other diseases of the female genital tract: Secondary | ICD-10-CM | POA: Insufficient documentation

## 2012-10-17 DIAGNOSIS — Z8659 Personal history of other mental and behavioral disorders: Secondary | ICD-10-CM | POA: Insufficient documentation

## 2012-10-17 DIAGNOSIS — Z8639 Personal history of other endocrine, nutritional and metabolic disease: Secondary | ICD-10-CM | POA: Insufficient documentation

## 2012-10-17 DIAGNOSIS — E669 Obesity, unspecified: Secondary | ICD-10-CM | POA: Insufficient documentation

## 2012-10-17 DIAGNOSIS — Z8709 Personal history of other diseases of the respiratory system: Secondary | ICD-10-CM | POA: Insufficient documentation

## 2012-10-17 DIAGNOSIS — Z79899 Other long term (current) drug therapy: Secondary | ICD-10-CM | POA: Insufficient documentation

## 2012-10-17 DIAGNOSIS — G43909 Migraine, unspecified, not intractable, without status migrainosus: Secondary | ICD-10-CM

## 2012-10-17 DIAGNOSIS — Z8719 Personal history of other diseases of the digestive system: Secondary | ICD-10-CM | POA: Insufficient documentation

## 2012-10-17 DIAGNOSIS — Z8669 Personal history of other diseases of the nervous system and sense organs: Secondary | ICD-10-CM | POA: Insufficient documentation

## 2012-10-17 DIAGNOSIS — J329 Chronic sinusitis, unspecified: Secondary | ICD-10-CM | POA: Insufficient documentation

## 2012-10-17 DIAGNOSIS — Z862 Personal history of diseases of the blood and blood-forming organs and certain disorders involving the immune mechanism: Secondary | ICD-10-CM | POA: Insufficient documentation

## 2012-10-17 MED ORDER — SODIUM CHLORIDE 0.9 % IV BOLUS (SEPSIS)
1000.0000 mL | Freq: Once | INTRAVENOUS | Status: AC
  Administered 2012-10-17: 1000 mL via INTRAVENOUS

## 2012-10-17 MED ORDER — KETOROLAC TROMETHAMINE 30 MG/ML IJ SOLN
30.0000 mg | Freq: Once | INTRAMUSCULAR | Status: AC
  Administered 2012-10-17: 30 mg via INTRAVENOUS
  Filled 2012-10-17: qty 1

## 2012-10-17 MED ORDER — DIPHENHYDRAMINE HCL 50 MG/ML IJ SOLN
25.0000 mg | Freq: Once | INTRAMUSCULAR | Status: AC
  Administered 2012-10-17: 25 mg via INTRAVENOUS

## 2012-10-17 MED ORDER — METOCLOPRAMIDE HCL 5 MG/ML IJ SOLN
10.0000 mg | Freq: Once | INTRAMUSCULAR | Status: AC
  Administered 2012-10-17: 10 mg via INTRAVENOUS

## 2012-10-17 MED ORDER — VALPROATE SODIUM 500 MG/5ML IV SOLN
500.0000 mg | Freq: Once | INTRAVENOUS | Status: DC
  Filled 2012-10-17: qty 5

## 2012-10-17 MED ORDER — DIPHENHYDRAMINE HCL 50 MG/ML IJ SOLN
50.0000 mg | Freq: Once | INTRAMUSCULAR | Status: DC
  Filled 2012-10-17: qty 1

## 2012-10-17 NOTE — ED Notes (Signed)
Pt states that this migraine started around 3 weeks ago and now she doesn't know if she has a sinus infection bc she cant differentiate between the pain.  Pt states that she gets confused and the pain has gotten worse so she felt she needed to be seen.  Pt states that she has been taking 1000mg  ibuprofen but not working like normal.

## 2012-10-17 NOTE — ED Provider Notes (Signed)
CSN: [id number]     Arrival date & time 10/17/12  1641 History   First MD Initiated Contact with Patient 10/17/12 1701     Chief Complaint  Patient presents with  . Migraine   (Consider location/radiation/quality/duration/timing/severity/associated sxs/prior Treatment) HPI Comments: 42 year old female with a recurrent migraine. She states the pain is a pressure around her entire head as a 9/10. She gets "cycles" of pain every 3 weeks and then when he gets bad like it did last night she takes ibuprofen and the powder which usually makes the pain bearable. This time the pain did not respond in situ presents to the ED for treatment. She was seen was a long earlier today asking for IV Depakote "push". She states that her previous neurologist Dr. love treat her this way. However the ED physician was unable to do this as pharmacy would recommend against a pusher. Patient states she is also having left-sided weakness in her hand and face. She states this is how her migraines always happen for several years and is not new. Denies any fevers or neck pain. States she also feels like her knee and middle finger have increased sensitivity but do not hurt to touch.  The history is provided by the patient.    Past Medical History  Diagnosis Date  . Anxiety   . Chest pain     neg stress test summer 2010  . Migraines   . Endometriosis   . Sinusitis   . Ovarian torsion   . Allergic rhinitis     hx of testing and allergic to spring and fall f= pollens  . Anemia     nos  . GERD (gastroesophageal reflux disease)   . Endometriosis   . Hyperlipidemia   . Headache(784.0)   . Labial cyst 04/08/2011    early infection use hotpcompresses     Past Surgical History  Procedure Laterality Date  . Abdominal surgery    . Oophrectomy    . Cesarean section    . Tubal ligation    . Unilateral salpingectomy      right  age 85 ectopics.   Family History  Problem Relation Age of Onset  . Anxiety disorder Mother      mother is on xanax and apparently applying for  social security diabilty for her anxiety. t  . Coronary artery disease    . Heart attack Father   . Diabetes    . Hyperlipidemia    . Other      history of child death in family    History  Substance Use Topics  . Smoking status: Never Smoker   . Smokeless tobacco: Not on file  . Alcohol Use: No   OB History   Grav Para Term Preterm Abortions TAB SAB Ect Mult Living   7 2             Obstetric Comments   3 ectopic     Review of Systems  Constitutional: Negative for fever and chills.  HENT: Negative for neck pain.   Eyes: Negative for photophobia and visual disturbance.  Gastrointestinal: Negative for vomiting.  Musculoskeletal: Negative for gait problem.  Neurological: Positive for headaches. Negative for weakness and numbness.  All other systems reviewed and are negative.    Allergies  Codeine phosphate; Moxifloxacin; and Sulfamethoxazole  Home Medications   Current Outpatient Rx  Name  Route  Sig  Dispense  Refill  . Aspirin-Caffeine (BC FAST PAIN RELIEF) 845-65 MG PACK   Oral  Take 65 mg by mouth every 8 (eight) hours as needed. Headache         . ibuprofen (ADVIL,MOTRIN) 200 MG tablet   Oral   Take 1,000 mg by mouth every 6 (six) hours as needed for pain (headache).         . triamterene-hydrochlorothiazide (MAXZIDE-25) 37.5-25 MG per tablet      TAKE ONE-HALF TABLET BY MOUTH ONCE DAILY   60 tablet   5    BP 132/86  Pulse 83  Temp(Src) 98.1 F (36.7 C) (Oral)  Resp 20  Ht 5\' 5"  (1.651 m)  Wt 239 lb (108.41 kg)  BMI 39.77 kg/m2  SpO2 99%  LMP 10/03/2012 Physical Exam  Nursing note and vitals reviewed. Constitutional: She is oriented to person, place, and time. She appears well-developed and well-nourished. No distress.  HENT:  Head: Normocephalic and atraumatic.  Right Ear: External ear normal.  Left Ear: External ear normal.  Nose: Nose normal.  Mouth/Throat: Oropharynx is clear and  moist.  Eyes: EOM are normal. Pupils are equal, round, and reactive to light. Right eye exhibits no discharge. Left eye exhibits no discharge.  Cardiovascular: Normal rate, regular rhythm and normal heart sounds.   Pulmonary/Chest: Effort normal and breath sounds normal.  Abdominal: Soft. There is no tenderness.  Neurological: She is alert and oriented to person, place, and time. She has normal strength. No cranial nerve deficit or sensory deficit. She exhibits normal muscle tone. GCS eye subscore is 4. GCS verbal subscore is 5. GCS motor subscore is 6.  5/5 strength in all 4 extremities. CN 2-12 grossly intact  Skin: Skin is warm and dry.    ED Course  Procedures (including critical care time) Labs Review Labs Reviewed - No data to display Imaging Review No results found.  MDM   1. Migraine    Patient with chronic and recurrent headache. She's not had any weakness on my exam though she states she feels weaker than normal on the left side. This is not successful with a stroke as she has this every time she has been migraines is more consistent with a complex migraine. She was given Toradol, Reglan, Benadryl, and a 1 L IV fluids and her pain went from a 9 to a 4. Patient states he feels well enough to go home and would like a neurologic referral as her neurologist is retired. I feel that another cause of headache such as subarachnoid hemorrhage, meningitis, extra-axial bleed, mass, or stroke is unlikely as this is a recurrent problem and exactly the same as her other migraines.    Audree Camel, MD 10/17/12 [phone]

## 2012-10-17 NOTE — ED Provider Notes (Signed)
CSN: [id number]     Arrival date & time 10/17/12  1216 History   First MD Initiated Contact with Patient 10/17/12 1259     Chief Complaint  Patient presents with  . Migraine  . Sinusitis   (Consider location/radiation/quality/duration/timing/severity/associated sxs/prior Treatment) HPI Patient reports that she has a migraine for the past 3 weeks. She denies headache however she complains of paresthesias of her entire left side of her body and uncontrollable movements of the left side of her body. She's been treated in the past with add Depakote however cannot tolerate due to nausea. She's been treated successfully with Depakote IV push by Dr.Love. Nothing makes symptoms better or worse. She also complains of facial congestion and sinus drainage for 2 days. No fever no other associated symptoms. Past Medical History  Diagnosis Date  . Anxiety   . Chest pain     neg stress test summer 2010  . Migraines   . Endometriosis   . Sinusitis   . Ovarian torsion   . Allergic rhinitis     hx of testing and allergic to spring and fall f= pollens  . Anemia     nos  . GERD (gastroesophageal reflux disease)   . Endometriosis   . Hyperlipidemia   . Headache(784.0)   . Labial cyst 04/08/2011    early infection use hotpcompresses     Past Surgical History  Procedure Laterality Date  . Abdominal surgery    . Oophrectomy    . Cesarean section    . Tubal ligation    . Unilateral salpingectomy      right  age 42 ectopics.   Family History  Problem Relation Age of Onset  . Anxiety disorder Mother     mother is on xanax and apparently applying for  social security diabilty for her anxiety. t  . Coronary artery disease    . Heart attack Father   . Diabetes    . Hyperlipidemia    . Other      history of child death in family    History  Substance Use Topics  . Smoking status: Never Smoker   . Smokeless tobacco: Not on file  . Alcohol Use: No   OB History   Grav Para Term Preterm  Abortions TAB SAB Ect Mult Living   7 2             Obstetric Comments   3 ectopic     Review of Systems  Constitutional: Negative.   HENT: Positive for congestion.   Respiratory: Negative.   Cardiovascular: Negative.   Gastrointestinal: Negative.   Musculoskeletal: Negative.   Skin: Negative.   Neurological: Negative.        Paresthesias  Psychiatric/Behavioral: Negative.   All other systems reviewed and are negative.    Allergies  Codeine phosphate; Moxifloxacin; and Sulfamethoxazole  Home Medications   Current Outpatient Rx  Name  Route  Sig  Dispense  Refill  . Aspirin-Caffeine (BC FAST PAIN RELIEF) 845-65 MG PACK   Oral   Take 65 mg by mouth every 8 (eight) hours as needed. Headache         . ibuprofen (ADVIL,MOTRIN) 200 MG tablet   Oral   Take 1,000 mg by mouth every 6 (six) hours as needed for pain (headache).         . triamterene-hydrochlorothiazide (MAXZIDE-25) 37.5-25 MG per tablet      TAKE ONE-HALF TABLET BY MOUTH ONCE DAILY   60 tablet   5  BP 113/75  Pulse 85  Temp(Src) 98.8 F (37.1 C) (Oral)  Resp 18  SpO2 100%  LMP 10/03/2012 Physical Exam  Nursing note and vitals reviewed. Constitutional: She appears well-developed and well-nourished.  HENT:  Head: Normocephalic and atraumatic.  Eyes: Conjunctivae are normal. Pupils are equal, round, and reactive to light.  Neck: Neck supple. No tracheal deviation present. No thyromegaly present.  Cardiovascular: Normal rate and regular rhythm.   No murmur heard. Pulmonary/Chest: Effort normal and breath sounds normal.  Abdominal: Soft. Bowel sounds are normal. She exhibits no distension. There is no tenderness.  obese  Musculoskeletal: Normal range of motion. She exhibits no edema and no tenderness.  Neurological: She is alert. Coordination normal.  Skin: Skin is warm and dry. No rash noted.  Psychiatric: She has a normal mood and affect.    ED Course  Procedures (including critical care  time) Labs Review Labs Reviewed - No data to display Imaging Review No results found.  MDM  No diagnosis found. I offered to order Depakote intravenous however patient insisted on Depakote intravenous push. After checking with the hospital pharmacist, suggested maximum dose is 20 mg per minute infusion. Patient declined. Declined further treatment. Possible early sinusitis. Suggests symptomatic treatment. Saline nasal spray. Plan referral to for [facility] Diagnosis migraine equivalent    Doug Sou, MD 10/17/12 1419

## 2012-10-17 NOTE — Telephone Encounter (Signed)
Patient Information:  Caller Name: [place]  Phone: [phone]  Patient: [name], [name]  Gender: Female  DOB: [birth year]  Age: 42 Years  PCP: Berniece Andreas [facility])  Pregnant: No  Office Follow Up:  Does the office need to follow up with this patient?: No  Instructions For The Office: N/A  RN Note:  She is going to go to [facility] since it is near her house. Advised to have someone else drive her if not going to call 911.  Symptoms  Reason For Call & Symptoms: Calling about starting with Migraine Cycle - started 3 weeks ago. Chills on and off  onset past 3 days. Today and last night H/A worse and having L side weakness and fingers on L hand achying and start loosing coordination of hand. Dx with Complex Migraines by Neurologist, who is now retired. In the past she had gotton Depakote IV push and went to [facility] ER today and MD would not do it.  Reviewed Health History In EMR: Yes  Reviewed Medications In EMR: Yes  Reviewed Allergies In EMR: Yes  Reviewed Surgeries / Procedures: Yes  Date of Onset of Symptoms: 10/14/2012  Treatments Tried: BC pwd and Ibuprofen  Treatments Tried Worked: No OB / GYN:  LMP: 10/03/2012  Guideline(s) Used:  Headache  Disposition Per Guideline:   Call EMS 911 Now  Reason For Disposition Reached:   Numbness of the face, arm or leg on one side of the body and new onset  Advice Given:  Reassurance - Migraine Headache:  You have told me that this headache is similar to previous migraine headaches that you have had. If the pattern or severity of your headache changes, you will need to see your physician.  Migraine headaches are also called vascular headaches. A migraine can be anywhere from mild to severely painful. Sufferers often describe it as throbbing or pulsing. It is often just on one side. Associated symptoms include nausea and vomiting. Some individuals will have visual or other neurological warning symptoms (aura) that a  migraine is coming.  This sounds like a painful headache that you are having, but there are pain medications you can take and other instructions I can give you to reduce the pain.  Migraine Medication:   If your doctor has prescribed specific medication for your migraine, take it as directed as soon as the migraine starts.  Rest:   Lie down in a dark, quiet place and try to relax. Close your eyes and imagine your entire body relaxing.  Apply Cold to the Area:   Apply a cold wet washcloth or cold pack to the forehead for 20 minutes.  Call Back If:  Headache lasts longer than 24 hours  You become worse.  Patient Will Follow Care Advice:  YES

## 2012-10-17 NOTE — ED Notes (Signed)
Pt reports "migraine cycle" that started 3 weeks ago.  Sts she can usually "break it but about once a year I can't break it."  Generally uses Ibuprofen and BC but it has not worked. Pt reports also some chills over past few days.  Was seen at [facility] ED today and wanted IV depakote "push".  Sts the MD was not comfortable with that and would not give it to her.  Sts a depakote drip won't work.  Only the push works. Pt left [facility] without tx.

## 2012-10-19 NOTE — Telephone Encounter (Signed)
I called pt and told her that I will place her name on the Love MD rotation list for upcoming appt reassignment.  For now she is ok.  I told her that if migraine and needs IV infusion, she should call the office and ask to speak to nurse about IV depacon and we can check with WID or her new MD.  She verbalized understanding.

## 2012-11-30 ENCOUNTER — Other Ambulatory Visit: Payer: Self-pay

## 2012-12-20 ENCOUNTER — Telehealth: Payer: Self-pay | Admitting: Neurology

## 2012-12-20 NOTE — Telephone Encounter (Signed)
Called former Dr love pt to schedule appt, couldn't reach ,left message to call back and schedule.

## 2013-02-07 ENCOUNTER — Encounter: Payer: Self-pay | Admitting: Internal Medicine

## 2013-02-07 ENCOUNTER — Ambulatory Visit (INDEPENDENT_AMBULATORY_CARE_PROVIDER_SITE_OTHER): Payer: BC Managed Care – PPO | Admitting: Internal Medicine

## 2013-02-07 VITALS — BP 114/72 | HR 96 | Temp 98.0°F | Ht 65.5 in | Wt 248.0 lb

## 2013-02-07 DIAGNOSIS — J019 Acute sinusitis, unspecified: Secondary | ICD-10-CM

## 2013-02-07 DIAGNOSIS — E049 Nontoxic goiter, unspecified: Secondary | ICD-10-CM

## 2013-02-07 DIAGNOSIS — D34 Benign neoplasm of thyroid gland: Secondary | ICD-10-CM

## 2013-02-07 DIAGNOSIS — R635 Abnormal weight gain: Secondary | ICD-10-CM

## 2013-02-07 DIAGNOSIS — J329 Chronic sinusitis, unspecified: Secondary | ICD-10-CM

## 2013-02-07 DIAGNOSIS — M2550 Pain in unspecified joint: Secondary | ICD-10-CM

## 2013-02-07 LAB — CBC WITH DIFFERENTIAL/PLATELET
BASOS PCT: 0.4 % (ref 0.0–3.0)
Basophils Absolute: 0 10*3/uL (ref 0.0–0.1)
EOS PCT: 1.8 % (ref 0.0–5.0)
Eosinophils Absolute: 0.2 10*3/uL (ref 0.0–0.7)
HCT: 39.9 % (ref 36.0–46.0)
HEMOGLOBIN: 13.4 g/dL (ref 12.0–15.0)
LYMPHS PCT: 39.8 % (ref 12.0–46.0)
Lymphs Abs: 4.4 10*3/uL — ABNORMAL HIGH (ref 0.7–4.0)
MCHC: 33.4 g/dL (ref 30.0–36.0)
MCV: 89.6 fl (ref 78.0–100.0)
MONOS PCT: 7.7 % (ref 3.0–12.0)
Monocytes Absolute: 0.9 10*3/uL (ref 0.1–1.0)
NEUTROS ABS: 5.6 10*3/uL (ref 1.4–7.7)
NEUTROS PCT: 50.3 % (ref 43.0–77.0)
Platelets: 449 10*3/uL — ABNORMAL HIGH (ref 150.0–400.0)
RBC: 4.46 Mil/uL (ref 3.87–5.11)
RDW: 13.6 % (ref 11.5–14.6)
WBC: 11.1 10*3/uL — AB (ref 4.5–10.5)

## 2013-02-07 LAB — BASIC METABOLIC PANEL
BUN: 9 mg/dL (ref 6–23)
CALCIUM: 9.6 mg/dL (ref 8.4–10.5)
CO2: 26 mEq/L (ref 19–32)
CREATININE: 0.9 mg/dL (ref 0.4–1.2)
Chloride: 104 mEq/L (ref 96–112)
GFR: 88.35 mL/min (ref >60.00)
Glucose, Bld: 79 mg/dL (ref 70–99)
Potassium: 3.8 mEq/L (ref 3.5–5.1)
SODIUM: 137 meq/L (ref 135–145)

## 2013-02-07 LAB — TSH: TSH: 1.18 u[IU]/mL (ref 0.35–5.50)

## 2013-02-07 LAB — C-REACTIVE PROTEIN: CRP: 0.6 mg/dL (ref 0.5–20.0)

## 2013-02-07 LAB — SEDIMENTATION RATE: SED RATE: 11 mm/h (ref 0–22)

## 2013-02-07 LAB — T4, FREE: FREE T4: 0.69 ng/dL (ref 0.60–1.60)

## 2013-02-07 LAB — FERRITIN: Ferritin: 38.6 ng/mL (ref 10.0–291.0)

## 2013-02-07 LAB — T3, FREE: T3 FREE: 2.9 pg/mL (ref 2.3–4.2)

## 2013-02-07 MED ORDER — AMOXICILLIN-POT CLAVULANATE 875-125 MG PO TABS: 1.0000 | ORAL_TABLET | Freq: Two times a day (BID) | ORAL | Status: DC

## 2013-02-07 NOTE — Progress Notes (Signed)
Chief Complaint  Patient presents with  . Fatigue    Also complains of green/clear nasal discharge that started before Christmas.  . Weight Gain  . Joint Pain    HPI: Patient comes in today for SDA for  new problem evaluation. Last ov was 4 14 for uri Over the last few months increasing problem with joint pains that at times hard to get out of bed hips  And wrists.  Elbows a lot and knee ankles  Family hx of ra GM  Has thyroid nodule and was supposed to  See dr e in a year but they havent called for an appt.  weight gain  Please check thryoid  Has job in [place]   [place]  Sits all day eats in car no reg exercise at present HAs not too bad in bertween  Neurologist   Uses caffiene sometimes  b vitamins have helped her migraines  No injury no acute illness but had cold a month ago and still has stuffiness and green  ROS: See pertinent positives and negatives per HPI.  Past Medical History  Diagnosis Date  . Anxiety   . Chest pain     neg stress test summer 2010  . Migraines   . Endometriosis   . Sinusitis   . Ovarian torsion   . Allergic rhinitis     hx of testing and allergic to spring and fall f= pollens  . Anemia     nos  . GERD (gastroesophageal reflux disease)   . Endometriosis   . Hyperlipidemia   . Headache(784.0)   . Labial cyst 04/08/2011    early infection use hotpcompresses      Family History  Problem Relation Age of Onset  . Anxiety disorder Mother     mother is on xanax and apparently applying for  social security diabilty for her anxiety. t  . Coronary artery disease    . Heart attack Father   . Diabetes    . Hyperlipidemia    . Other      history of child death in family     History   Social History  . Marital Status: Single    Spouse Name: N/A    Number of Children: N/A  . Years of Education: N/A   Social History Main Topics  . Smoking status: Never Smoker   . Smokeless tobacco: None  . Alcohol Use: No  . Drug Use: No  . Sexual Activity:     Other Topics Concern  . 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   And graduated as SW.   Cant find job at this time in [place] moved back home to [place] and has stress with chid care       Now working as a Dance movement psychotherapist care daytime doing well    Outpatient Encounter Prescriptions as of 02/07/2013  Medication Sig  . Aspirin-Caffeine (BC FAST PAIN RELIEF) 845-65 MG PACK Take 65 mg by mouth every 8 (eight) hours as needed. Headache  . ibuprofen (ADVIL,MOTRIN) 200 MG tablet Take 1,000 mg by mouth every  6 (six) hours as needed for pain (headache).  . Multiple Vitamins-Minerals (MULTIVITAMIN PO) Take by mouth.  . Pyridoxine-Kelp-Lect-Vinegar (KLB6 PO) Take by mouth.  . triamterene-hydrochlorothiazide (MAXZIDE-25) 37.5-25 MG per tablet TAKE ONE-HALF TABLET BY MOUTH ONCE DAILY  . vitamin B-12 (CYANOCOBALAMIN) 50 MCG tablet Taking 4-10 tablets daily  . amoxicillin-clavulanate (AUGMENTIN) 875-125 MG per tablet Take 1 tablet by mouth every 12 (twelve) hours.    EXAM:  BP 114/72  Pulse 96  Temp(Src) 98 F (36.7 C) (Oral)  Ht 5' 5.5" (1.664 m)  Wt 248 lb (112.492 kg)  BMI 40.63 kg/m2  SpO2 98%  Body mass index is 40.63 kg/(m^2).  GENERAL: vitals reviewed and listed above, alert, oriented, appears well hydrated and in no acute distress HEENT: atraumatic, conjunctiva  clear, no obvious abnormalities on inspection of external nose and ears  Nose congested face tender ( chornic)OP : no lesion edema or exudate  NECK: no obvious masses on inspection  Goiter and large nodule right non tender  LUNGS: clear to auscultation bilaterally, no wheezes, rales or rhonchi, good air movement CV: HRRR, no clubbing  cyanosis or  peripheral edema nl cap refill  Abdomen:  Sof,t normal bowel sounds without hepatosplenomegaly, no guarding rebound or masses no CVA tenderness MS: moves all extremities without noticeable focal  Abnormality but puffy le no redness or warmth or deformity  PSYCH: pleasant and cooperative, no obvious depression or anxiety  ASSESSMENT AND PLAN:  Discussed the following assessment and plan:  Multiple joint pain - Plan: vitamin B-12 (CYANOCOBALAMIN) 50 MCG tablet, Multiple Vitamins-Minerals (MULTIVITAMIN PO), Pyridoxine-Kelp-Lect-Vinegar (KLB6 PO), Basic metabolic panel, CBC with Differential, TSH, T4, free, T3, free, C-reactive protein, Sedimentation rate, ANA, Cyclic Citrul Peptide Antibody, IGG, Ferritin  Weight gain - Plan: vitamin B-12 (CYANOCOBALAMIN) 50 MCG tablet, Multiple Vitamins-Minerals (MULTIVITAMIN PO), Pyridoxine-Kelp-Lect-Vinegar (KLB6 PO), Basic metabolic panel, CBC with Differential, TSH, T4, free, T3, free, C-reactive protein, Sedimentation rate, ANA, Cyclic Citrul Peptide Antibody, IGG, Ferritin  Goiter - Plan: vitamin B-12 (CYANOCOBALAMIN) 50 MCG tablet, Multiple Vitamins-Minerals (MULTIVITAMIN PO), Pyridoxine-Kelp-Lect-Vinegar (KLB6 PO), Basic metabolic panel, CBC with Differential, TSH, T4, free, T3, free, C-reactive protein, Sedimentation rate, ANA, Cyclic Citrul Peptide Antibody, IGG, Ferritin  Thyroid adenoma - Plan: Basic metabolic panel, CBC with Differential, TSH, T4, free, T3, free, C-reactive protein, Sedimentation rate, ANA, Cyclic Citrul Peptide Antibody, IGG, Ferritin  Acute sinusitis with symptoms greater than 10 days - acute on chronic  can add antibiotic empiric  - Plan: Basic metabolic panel, CBC with Differential, TSH, T4, free, T3, free, C-reactive protein, Sedimentation rate, ANA, Cyclic Citrul Peptide Antibody, IGG, Ferritin  SINUSITIS, RECURRENT Acute on chronic sinusitis reasonable to rx with antibiotic   -Patient advised to return or notify  health care team  if symptoms worsen or persist or new concerns arise.  Patient Instructions   Will notify you  of labs when available.  Rheum appt  Probable .  Wt Readings from Last 3 Encounters:  02/07/13 248 lb (112.492 kg)  10/17/12 239 lb (108.41 kg)  05/04/12 231 lb (104.781 kg)  Will notify you  of labs when available. Monitoring and checking   Eating  And activity can help correct.  Avoid mindless eating   No calories in beverages.   Set up with neuro and dr Loanne Drilling  Get a pedometer.  Antibiotic for possible residual sinus infection.  Set a goal of 8 pounds in 4 months       Neyland Pettengill K. Vennela Jutte M.D.  Pre visit review using our  clinic review tool, if applicable. No additional management support is needed unless otherwise documented below in the visit note.

## 2013-02-07 NOTE — Patient Instructions (Addendum)
Will notify you  of labs when available.  Rheum appt  Probable .  Wt Readings from Last 3 Encounters:  02/07/13 248 lb (112.492 kg)  10/17/12 239 lb (108.41 kg)  05/04/12 231 lb (104.781 kg)  Will notify you  of labs when available. Monitoring and checking   Eating  And activity can help correct.  Avoid mindless eating   No calories in beverages.   Set up with neuro and dr Loanne Drilling  Get a pedometer.  Antibiotic for possible residual sinus infection.  Set a goal of 8 pounds in 4 months

## 2013-02-08 LAB — ANA: ANA: NEGATIVE

## 2013-02-08 LAB — CYCLIC CITRUL PEPTIDE ANTIBODY, IGG: Cyclic Citrullin Peptide Ab: <2 U/mL (ref 0.0–5.0)

## 2013-02-14 ENCOUNTER — Telehealth: Payer: Self-pay | Admitting: Internal Medicine

## 2013-02-14 NOTE — Telephone Encounter (Signed)
Patient Information:  Caller Name: Margarete  Phone: [phone]  Patient: [name], [name]  Gender: Female  DOB: 11-Mar-1970  Age: 43 Years  PCP: [name] [facility])  Pregnant: No  Office Follow Up:  Does the office need to follow up with this patient?: No  Instructions For The Office: N/A  RN Note:  Is taking Motrin 600mg  every 6hrs ans helps pain but pain is severe if not taken. Wants Augmentin prescription extended for 10 days instead of 9.  Symptoms  Reason For Call & Symptoms: Was seen in office 1-14 and given Augmentin for sinus infection. Began medicine 1-16. Developed fever 102.6 1-20 but oone 1-21. Has had sore throat since 1-19. Is drinking fluids but unable to eat due to pain.  Reviewed Health History In EMR: Yes  Reviewed Medications In EMR: Yes  Reviewed Allergies In EMR: Yes  Reviewed Surgeries / Procedures: Yes  Date of Onset of Symptoms: 02/12/2013  Treatments Tried: Motrin  Treatments Tried Worked: Yes OB / GYN:  LMP: 02/14/2013  Guideline(s) Used:  Sore Throat  Disposition Per Guideline:   See Today in Office  Reason For Disposition Reached:   Severe sore throat pain  Advice Given: Advised urgent care 1-21 due to severe pain. Patient wanted appointment 1-22 and scheduled for 0915.  Patient Will Follow Care Advice:  YES  Appointment Scheduled:  02/15/2013 09:15:00 Appointment Scheduled Provider:  [name] [facility])

## 2013-02-15 ENCOUNTER — Other Ambulatory Visit (HOSPITAL_COMMUNITY)
Admission: RE | Admit: 2013-02-15 | Discharge: 2013-02-15 | Disposition: A | Discharge status: Home / Self Care | Payer: BC Managed Care – PPO | Source: Ambulatory Visit | Attending: Internal Medicine | Admitting: Internal Medicine

## 2013-02-15 ENCOUNTER — Ambulatory Visit (INDEPENDENT_AMBULATORY_CARE_PROVIDER_SITE_OTHER): Payer: BC Managed Care – PPO | Admitting: Internal Medicine

## 2013-02-15 ENCOUNTER — Encounter: Payer: Self-pay | Admitting: Internal Medicine

## 2013-02-15 VITALS — BP 124/84 | HR 100 | Temp 99.6°F | Ht 66.0 in | Wt 244.0 lb

## 2013-02-15 DIAGNOSIS — R509 Fever, unspecified: Secondary | ICD-10-CM | POA: Insufficient documentation

## 2013-02-15 DIAGNOSIS — J039 Acute tonsillitis, unspecified: Secondary | ICD-10-CM | POA: Insufficient documentation

## 2013-02-15 DIAGNOSIS — J329 Chronic sinusitis, unspecified: Secondary | ICD-10-CM

## 2013-02-15 DIAGNOSIS — J020 Streptococcal pharyngitis: Secondary | ICD-10-CM | POA: Insufficient documentation

## 2013-02-15 DIAGNOSIS — R093 Abnormal sputum: Secondary | ICD-10-CM

## 2013-02-15 LAB — POCT RAPID STREP A (OFFICE): Rapid Strep A Screen: NEGATIVE

## 2013-02-15 MED ORDER — CEFTRIAXONE SODIUM 1 G IJ SOLR
1.0000 g | Freq: Once | INTRAMUSCULAR | Status: AC
  Administered 2013-02-15: 1 g via INTRAMUSCULAR

## 2013-02-15 MED ORDER — CLINDAMYCIN HCL 300 MG PO CAPS: 300.0000 mg | ORAL_CAPSULE | Freq: Three times a day (TID) | ORAL | Status: DC

## 2013-02-15 NOTE — Patient Instructions (Addendum)
Will send the tissue off to pathology possibly a necrotic  Tissue that is infected . Ref to ENT  rocephin antibiotic today . Change antibiotic to clindamycin  Until see ent.  If not getting better in the next 24 hours

## 2013-02-15 NOTE — Progress Notes (Signed)
Chief Complaint  Patient presents with  . Sore Throat    Started Monday.  Continues with sinusitis from 02/07/2013.    HPI: Patient comes in today for SDA for  new problem evaluation. See [facility]  rx for sinsutis with augmentin and then develop ear pain and fever 102  99+ today  No rash  Didn't start antibiotic until day before. Got fever ( ? 4 days ago )  Face pain   And tsore throat  .Very   Hard to swallow.  Cant eat can take oral liquids On 4th day of augmentin throat on fire and spit out large piece of substance this am   brough tin to view  No hemoptysis  Minor cough has fever and chills . Ears hurt and neck near glands . ROS: See pertinent positives and negatives per HPI.  Past Medical History  Diagnosis Date  . Anxiety   . Chest pain     neg stress test summer 2010  . Migraines   . Endometriosis   . Sinusitis   . Ovarian torsion   . Allergic rhinitis     hx of testing and allergic to spring and fall f= pollens  . Anemia     nos  . GERD (gastroesophageal reflux disease)   . Endometriosis   . Hyperlipidemia   . Headache(784.0)   . Labial cyst 04/08/2011    early infection use hotpcompresses      Family History  Problem Relation Age of Onset  . Anxiety disorder Mother     mother is on xanax and apparently applying for  social security diabilty for her anxiety. t  . Coronary artery disease    . Heart attack Father   . Diabetes    . Hyperlipidemia    . Other      history of child death in family     History   Social History  . Marital Status: Single    Spouse Name: N/A    Number of Children: N/A  . Years of Education: N/A   Social History Main Topics  . Smoking status: Never Smoker   . Smokeless tobacco: None  . Alcohol Use: No  . Drug Use: No  . Sexual Activity:    Other Topics Concern  . 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   And graduated as SW.   Cant find job at this time in [place] moved back home to [place] and has stress with chid care       Now working as a Dance movement psychotherapist care daytime doing well    Outpatient Encounter Prescriptions as of 02/15/2013  Medication Sig  . amoxicillin-clavulanate (AUGMENTIN) 875-125 MG per tablet Take 1 tablet by mouth every 12 (twelve) hours.  . Aspirin-Caffeine (BC FAST PAIN RELIEF) 845-65 MG PACK Take 65 mg by mouth every 8 (eight) hours as needed. Headache  . ibuprofen (ADVIL,MOTRIN) 200 MG tablet Take 1,000 mg by mouth every 6 (six) hours as needed for pain (headache).  . Multiple Vitamins-Minerals (MULTIVITAMIN PO) Take by mouth.  . Pyridoxine-Kelp-Lect-Vinegar (KLB6 PO) Take by mouth.  Marland Kitchen  triamterene-hydrochlorothiazide (MAXZIDE-25) 37.5-25 MG per tablet TAKE ONE-HALF TABLET BY MOUTH ONCE DAILY  . vitamin B-12 (CYANOCOBALAMIN) 50 MCG tablet Taking 4-10 tablets daily  . clindamycin (CLEOCIN) 300 MG capsule Take 1 capsule (300 mg total) by mouth 3 (three) times daily.  . [EXPIRED] cefTRIAXone (ROCEPHIN) injection 1 g     EXAM:  BP 124/84  Pulse 100  Temp(Src) 99.6 F (37.6 C) (Oral)  Ht 5\' 6"  (1.676 m)  Wt 244 lb (110.678 kg)  BMI 39.40 kg/m2  SpO2 98%  Body mass index is 39.4 kg/(m^2).  GENERAL: vitals reviewed and listed above, alert, oriented, appears well hydrated and in no acute distresssick non toxic  HEENT: atraumatic, conjunctiva  clear, no obvious abnormalities on inspection of external nose and ears OP :  taonils 2-3 + airway ok  Face mon tender no lesion edema or exudate  NECK: no obvious masses on inspection palpation very tender ac nodes  1+  LUNGS: clear to auscultation bilaterally, no wheezes, rales or rhonchi, good air movement CV: HRRR, no clubbing cyanosis or  peripheral edema nl cap  refill  PSYCH: pleasant and cooperative, no obvious depression or anxiety  ASSESSMENT AND PLAN:  Discussed the following assessment and plan:  Acute tonsillitis - Plan: Ambulatory referral to ENT, Pathology, cefTRIAXone (ROCEPHIN) injection 1 g   sore throat - r/o strep  - Plan: POC Rapid Strep A, Throat culture Randell Loop), Pathology, cefTRIAXone (ROCEPHIN) injection 1 g  Sinusitis - under rx  but continueing  i thnk tonsillar infecgtion is more symptomatic  - Plan: Ambulatory referral to ENT, Pathology, cefTRIAXone (ROCEPHIN) injection 1 g  Fever, unspecified - Plan: Pathology, cefTRIAXone (ROCEPHIN) injection 1 g  Abnormal sputum - Plan: Pathology Delayed antibiotic .  Fever this week although perhaps down today   Suspect bacterial cause ( althoguh flu in the diff dx)  bbost with rocephin today and if not a lot better change to clindamycin  . (augmentin started late and may be taking  A while to work. ) Tissue substance sent to pathology  In the meantime.  Gargles  Fluids  ent consult soon.  -Patient advised to return or notify health care team  if symptoms worsen or persist or new concerns arise. Can go back to work when fever gone .   Patient Instructions  Will send the tissue off to pathology possibly a necrotic  Tissue that is infected . Ref to ENT  rocephin antibiotic today . Change antibiotic to clindamycin  Until see ent.  If not getting better in the next 24 hours    Wanda K. Panosh M.D.  Pre visit review using our clinic review tool, if applicable. No additional management support is needed unless otherwise documented below in the visit note.

## 2013-02-16 ENCOUNTER — Encounter: Payer: Self-pay | Admitting: Family Medicine

## 2013-02-16 ENCOUNTER — Other Ambulatory Visit: Payer: Self-pay | Admitting: Family

## 2013-02-16 ENCOUNTER — Ambulatory Visit (INDEPENDENT_AMBULATORY_CARE_PROVIDER_SITE_OTHER): Payer: BC Managed Care – PPO | Admitting: Endocrinology

## 2013-02-16 ENCOUNTER — Encounter: Payer: Self-pay | Admitting: Endocrinology

## 2013-02-16 VITALS — BP 120/64 | HR 77 | Temp 97.7°F | Ht 66.0 in | Wt 247.0 lb

## 2013-02-16 DIAGNOSIS — E079 Disorder of thyroid, unspecified: Secondary | ICD-10-CM

## 2013-02-16 NOTE — Progress Notes (Signed)
Subjective:    Patient ID: [name], female    DOB: Jul 15, 1970, 43 y.o.   MRN: [id number]  HPI In 2013, a goiter was noted in the eval of uri. bx in 2013 showed non-neoplastic goiter.  She can notice the mass, but can't tell if it is enlarging.  she takes kelp for fatigue.   Past Medical History  Diagnosis Date  . Anxiety   . Chest pain     neg stress test summer 2010  . Migraines   . Endometriosis   . Sinusitis   . Ovarian torsion   . Allergic rhinitis     hx of testing and allergic to spring and fall f= pollens  . Anemia     nos  . GERD (gastroesophageal reflux disease)   . Endometriosis   . Hyperlipidemia   . Headache(784.0)   . Labial cyst 04/08/2011    early infection use hotpcompresses      Past Surgical History  Procedure Laterality Date  . Abdominal surgery    . Oophrectomy    . Cesarean section    . Tubal ligation    . Unilateral salpingectomy      right  age 89 ectopics.    History   Social History  . Marital Status: Single    Spouse Name: N/A    Number of Children: N/A  . Years of Education: N/A   Occupational History  . Not on file.   Social History Main Topics  . Smoking status: Never Smoker   . Smokeless tobacco: Not on file  . Alcohol Use: No  . Drug Use: No  . Sexual Activity:    Other Topics Concern  . 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   And graduated as SW.   Cant find job at this time in [place] moved back home to [place] and has stress with chid care       Now working as a Dance movement psychotherapist care daytime doing well    Current Outpatient Prescriptions on File Prior to Visit  Medication Sig Dispense Refill  . amoxicillin-clavulanate  (AUGMENTIN) 875-125 MG per tablet Take 1 tablet by mouth every 12 (twelve) hours.  14 tablet  0  . Aspirin-Caffeine (BC FAST PAIN RELIEF) 845-65 MG PACK Take 65 mg by mouth every 8 (eight) hours as needed. Headache      . clindamycin (CLEOCIN) 300 MG capsule Take 1 capsule (300 mg total) by mouth 3 (three) times daily.  21 capsule  0  . ibuprofen (ADVIL,MOTRIN) 200 MG tablet Take 1,000 mg by mouth every 6 (six) hours as needed for pain (headache).      . Multiple Vitamins-Minerals (MULTIVITAMIN PO) Take by mouth.      . Pyridoxine-Kelp-Lect-Vinegar (KLB6 PO) Take by mouth.      . triamterene-hydrochlorothiazide (MAXZIDE-25) 37.5-25 MG per tablet TAKE ONE-HALF TABLET BY MOUTH ONCE DAILY  60 tablet  5  . vitamin B-12 (CYANOCOBALAMIN) 50 MCG tablet Taking 4-10 tablets daily       No current facility-administered medications on file prior to visit.  Allergies  Allergen Reactions  . Codeine Phosphate     REACTION: unspecified  . Moxifloxacin     REACTION: orickly sensation, joint pain  . Sulfamethoxazole     REACTION: unspecified    Family History  Problem Relation Age of Onset  . Anxiety disorder Mother     mother is on xanax and apparently applying for  social security diabilty for her anxiety. t  . Coronary artery disease    . Heart attack Father   . Diabetes    . Hyperlipidemia    . Other      history of child death in family     BP 120/64  Pulse 77  Temp(Src) 97.7 F (36.5 C) (Oral)  Ht 5\' 6"  (1.676 m)  Wt 247 lb (112.038 kg)  BMI 39.89 kg/m2  SpO2 97%   Review of Systems Denies neck pain    Objective:   Physical Exam VITAL SIGNS:  See vs page GENERAL: no distress Neck: the right thyroid nodule is palpable.         Assessment & Plan:  Right thyroid mass, benign on bx.  She is due for f/u US.

## 2013-02-16 NOTE — Telephone Encounter (Signed)
Pt needs a work note to return to work on 02-19-13. Pt was out of work from 1-19- thru 02-16-13 for flu etc

## 2013-02-16 NOTE — Telephone Encounter (Signed)
Patient notified to pick up note at the front desk.

## 2013-02-16 NOTE — Patient Instructions (Addendum)
You should avoid kelp, as it can worsen the goiter. Let's recheck the ultrasound.  you will receive a phone call, about a day and time for an appointment.   If the ultrasound is not significantly changed, please return in 1-2 years.

## 2013-02-17 LAB — CULTURE, GROUP A STREP: ORGANISM ID, BACTERIA: NORMAL

## 2013-02-19 ENCOUNTER — Encounter: Payer: Self-pay | Admitting: Internal Medicine

## 2013-02-28 ENCOUNTER — Telehealth: Payer: Self-pay | Admitting: Family Medicine

## 2013-02-28 ENCOUNTER — Other Ambulatory Visit: Payer: Self-pay | Admitting: Family Medicine

## 2013-02-28 MED ORDER — CLINDAMYCIN HCL 300 MG PO CAPS: 300.0000 mg | ORAL_CAPSULE | Freq: Three times a day (TID) | ORAL | Status: DC

## 2013-02-28 NOTE — Telephone Encounter (Signed)
I spoke to the pt to give the results of her throat culture and pathology.  Pt mentioned that she continues to have a sore throat, ear pain, sinus pain and a yellow drainage and an over all feeling of not feeling well.  Denies fever.  She is going to call Dr. Orland Penman office for an appointment.  She would like to know if she should pick up the clindamycin.  If so she wants it to be sent to [facility] on [address].  Please advise.  Thanks!

## 2013-02-28 NOTE — Telephone Encounter (Signed)
Left message for the pt to return my call. 

## 2013-02-28 NOTE — Telephone Encounter (Signed)
Pt did not call Dr. Orland Penman office today.  She was waiting to hear back from me.  She will call them in the morning.  Advised her to go ahead and start antibiotic since no appointment has been made with ENT.  Resent the prescription to a different pharmacy ([facility] on [address]).  She will pick up rx.

## 2013-02-28 NOTE — Telephone Encounter (Signed)
i f her appt is today or tomorrow can wait and see what he says  Otherwise  Would start it.

## 2013-03-12 ENCOUNTER — Other Ambulatory Visit: Payer: BC Managed Care – PPO

## 2013-03-20 ENCOUNTER — Inpatient Hospital Stay: Admission: RE | Admit: 2013-03-20 | Payer: BC Managed Care – PPO | Source: Ambulatory Visit

## 2013-03-26 ENCOUNTER — Other Ambulatory Visit: Payer: BC Managed Care – PPO

## 2013-04-09 ENCOUNTER — Other Ambulatory Visit: Payer: BC Managed Care – PPO

## 2013-06-07 ENCOUNTER — Ambulatory Visit: Payer: BC Managed Care – PPO | Admitting: Internal Medicine

## 2013-06-26 ENCOUNTER — Other Ambulatory Visit: Payer: Self-pay | Admitting: Internal Medicine

## 2013-06-28 NOTE — Telephone Encounter (Signed)
Ok to refill for 6 months 

## 2013-06-29 NOTE — Telephone Encounter (Signed)
Sent to the pharmacy by e-scribe. 

## 2013-08-21 ENCOUNTER — Emergency Department (HOSPITAL_BASED_OUTPATIENT_CLINIC_OR_DEPARTMENT_OTHER)
Admission: EM | Admit: 2013-08-21 | Discharge: 2013-08-21 | Disposition: A | Discharge status: Home / Self Care | Payer: No Typology Code available for payment source | Attending: Emergency Medicine | Admitting: Emergency Medicine

## 2013-08-21 ENCOUNTER — Emergency Department (HOSPITAL_BASED_OUTPATIENT_CLINIC_OR_DEPARTMENT_OTHER): Payer: No Typology Code available for payment source

## 2013-08-21 ENCOUNTER — Encounter (HOSPITAL_BASED_OUTPATIENT_CLINIC_OR_DEPARTMENT_OTHER): Payer: Self-pay | Admitting: Emergency Medicine

## 2013-08-21 DIAGNOSIS — R509 Fever, unspecified: Secondary | ICD-10-CM | POA: Insufficient documentation

## 2013-08-21 DIAGNOSIS — R61 Generalized hyperhidrosis: Secondary | ICD-10-CM | POA: Insufficient documentation

## 2013-08-21 DIAGNOSIS — Z8709 Personal history of other diseases of the respiratory system: Secondary | ICD-10-CM | POA: Insufficient documentation

## 2013-08-21 DIAGNOSIS — R110 Nausea: Secondary | ICD-10-CM | POA: Insufficient documentation

## 2013-08-21 DIAGNOSIS — Z8659 Personal history of other mental and behavioral disorders: Secondary | ICD-10-CM | POA: Insufficient documentation

## 2013-08-21 DIAGNOSIS — R6883 Chills (without fever): Secondary | ICD-10-CM | POA: Insufficient documentation

## 2013-08-21 DIAGNOSIS — R0789 Other chest pain: Secondary | ICD-10-CM | POA: Insufficient documentation

## 2013-08-21 DIAGNOSIS — Z862 Personal history of diseases of the blood and blood-forming organs and certain disorders involving the immune mechanism: Secondary | ICD-10-CM | POA: Insufficient documentation

## 2013-08-21 DIAGNOSIS — Z8639 Personal history of other endocrine, nutritional and metabolic disease: Secondary | ICD-10-CM | POA: Insufficient documentation

## 2013-08-21 DIAGNOSIS — Z792 Long term (current) use of antibiotics: Secondary | ICD-10-CM | POA: Insufficient documentation

## 2013-08-21 DIAGNOSIS — Z8679 Personal history of other diseases of the circulatory system: Secondary | ICD-10-CM | POA: Insufficient documentation

## 2013-08-21 DIAGNOSIS — R079 Chest pain, unspecified: Secondary | ICD-10-CM | POA: Insufficient documentation

## 2013-08-21 DIAGNOSIS — Z3202 Encounter for pregnancy test, result negative: Secondary | ICD-10-CM | POA: Insufficient documentation

## 2013-08-21 DIAGNOSIS — Z79899 Other long term (current) drug therapy: Secondary | ICD-10-CM | POA: Insufficient documentation

## 2013-08-21 DIAGNOSIS — Z8719 Personal history of other diseases of the digestive system: Secondary | ICD-10-CM | POA: Insufficient documentation

## 2013-08-21 DIAGNOSIS — Z8742 Personal history of other diseases of the female genital tract: Secondary | ICD-10-CM | POA: Insufficient documentation

## 2013-08-21 LAB — COMPREHENSIVE METABOLIC PANEL
ALT: 16 U/L (ref 0–35)
AST: 18 U/L (ref 0–37)
Albumin: 4 g/dL (ref 3.5–5.2)
Alkaline Phosphatase: 58 U/L (ref 39–117)
Anion gap: 15 (ref 5–15)
BUN: 19 mg/dL (ref 6–23)
CALCIUM: 9.3 mg/dL (ref 8.4–10.5)
CO2: 25 mEq/L (ref 19–32)
Chloride: 99 mEq/L (ref 96–112)
Creatinine, Ser: 1.1 mg/dL (ref 0.50–1.10)
GFR calc non Af Amer: 61 mL/min — ABNORMAL LOW (ref >90)
GFR, EST AFRICAN AMERICAN: 71 mL/min — AB (ref >90)
Glucose, Bld: 115 mg/dL — ABNORMAL HIGH (ref 70–99)
Potassium: 3.6 mEq/L — ABNORMAL LOW (ref 3.7–5.3)
Sodium: 139 mEq/L (ref 137–147)
TOTAL PROTEIN: 7.6 g/dL (ref 6.0–8.3)
Total Bilirubin: <0.2 mg/dL — ABNORMAL LOW (ref 0.3–1.2)

## 2013-08-21 LAB — URINALYSIS, ROUTINE W REFLEX MICROSCOPIC
Bilirubin Urine: NEGATIVE
GLUCOSE, UA: NEGATIVE mg/dL
Ketones, ur: NEGATIVE mg/dL
Leukocytes, UA: NEGATIVE
Nitrite: NEGATIVE
PH: 6.5 (ref 5.0–8.0)
Protein, ur: NEGATIVE mg/dL
SPECIFIC GRAVITY, URINE: 1.01 (ref 1.005–1.030)
UROBILINOGEN UA: 0.2 mg/dL (ref 0.0–1.0)

## 2013-08-21 LAB — CBC WITH DIFFERENTIAL/PLATELET
Basophils Absolute: 0 10*3/uL (ref 0.0–0.1)
Basophils Relative: 0 % (ref 0–1)
EOS ABS: 0.3 10*3/uL (ref 0.0–0.7)
EOS PCT: 3 % (ref 0–5)
HCT: 38.9 % (ref 36.0–46.0)
HEMOGLOBIN: 13 g/dL (ref 12.0–15.0)
Lymphocytes Relative: 46 % (ref 12–46)
Lymphs Abs: 5.2 10*3/uL — ABNORMAL HIGH (ref 0.7–4.0)
MCH: 30 pg (ref 26.0–34.0)
MCHC: 33.4 g/dL (ref 30.0–36.0)
MCV: 89.6 fL (ref 78.0–100.0)
MONOS PCT: 6 % (ref 3–12)
Monocytes Absolute: 0.7 10*3/uL (ref 0.1–1.0)
Neutro Abs: 5.1 10*3/uL (ref 1.7–7.7)
Neutrophils Relative %: 45 % (ref 43–77)
Platelets: 384 10*3/uL (ref 150–400)
RBC: 4.34 MIL/uL (ref 3.87–5.11)
RDW: 13.3 % (ref 11.5–15.5)
WBC: 11.3 10*3/uL — ABNORMAL HIGH (ref 4.0–10.5)

## 2013-08-21 LAB — URINE MICROSCOPIC-ADD ON

## 2013-08-21 LAB — TROPONIN I: Troponin I: <0.3 ng/mL (ref <0.30)

## 2013-08-21 LAB — PREGNANCY, URINE: PREG TEST UR: NEGATIVE

## 2013-08-21 MED ORDER — GI COCKTAIL ~~LOC~~
30.0000 mL | Freq: Once | ORAL | Status: AC
  Administered 2013-08-21: 30 mL via ORAL
  Filled 2013-08-21: qty 30

## 2013-08-21 MED ORDER — SODIUM CHLORIDE 0.9 % IV BOLUS (SEPSIS)
500.0000 mL | Freq: Once | INTRAVENOUS | Status: AC
  Administered 2013-08-21: 500 mL via INTRAVENOUS

## 2013-08-21 MED ORDER — ONDANSETRON HCL 4 MG/2ML IJ SOLN
4.0000 mg | Freq: Once | INTRAMUSCULAR | Status: AC
  Administered 2013-08-21: 4 mg via INTRAVENOUS
  Filled 2013-08-21: qty 2

## 2013-08-21 NOTE — ED Provider Notes (Signed)
CSN: [id number]     Arrival date & time 08/21/13  2007 History  This chart was scribed for Blanchard Kelch, MD by Cathie Hoops, ED Scribe. The patient was seen in [facility]. The patient's care was started at 9:19 PM.    Chief Complaint  Patient presents with  . Chest Pain   Patient is a 43 y.o. female presenting with chest pain. The history is provided by the patient. No language interpreter was used.  Chest Pain Pain location:  L chest and R chest Pain quality comment:  Squeezing Pain radiates to:  L shoulder Pain radiates to the back: no   Pain severity:  Moderate Onset quality:  Sudden Duration:  1 hour Timing:  Constant Progression:  Worsening Chronicity:  New Context: at rest   Relieved by:  None tried Worsened by:  Nothing tried Ineffective treatments:  None tried Associated symptoms: diaphoresis, fever (subjective) and nausea   Associated symptoms: no abdominal pain, no cough, no dizziness, no fatigue, no headache, no shortness of breath and not vomiting    HPI Comments: [name] is a 43 y.o. female who presents to the Emergency Department complaining of chest pain onset immediately prior to arrival. Pt states her symptoms began while she was driving her car. Pt reports she took a Zantac when she got home without relief. Pt reports associated belching, gas, abdominal bloating, nausea, subjective fever, chills, and constant, radiating left shoulder pain. Pt reports her left shoulder pain feels like "squeezing" and lasts for 3-5 seconds. Pt rates pain as 7/10. Pt denies vomiting, diaphoresis, bdominal pain or SOB.  Pt denies smoking. Pt has a past medical history of hyperlipidemia, migraines and endometriosis. Pt has a family history of heart attacks. Pt's father died of heart attack at 63. Pt reports her paternal grandmother has had 8 heart attacks but is currently alive.   Past Medical History  Diagnosis Date  . Anxiety   . Chest pain     neg stress test summer  2010  . Migraines   . Endometriosis   . Sinusitis   . Ovarian torsion   . Allergic rhinitis     hx of testing and allergic to spring and fall f= pollens  . Anemia     nos  . GERD (gastroesophageal reflux disease)   . Endometriosis   . Hyperlipidemia   . Headache(784.0)   . Labial cyst 04/08/2011    early infection use hotpcompresses     Past Surgical History  Procedure Laterality Date  . Abdominal surgery    . Oophrectomy    . Cesarean section    . Tubal ligation    . Unilateral salpingectomy      right  age 60 ectopics.   Family History  Problem Relation Age of Onset  . Anxiety disorder Mother     mother is on xanax and apparently applying for  social security diabilty for her anxiety. t  . Coronary artery disease    . Heart attack Father   . Diabetes    . Hyperlipidemia    . Other      history of child death in family    History  Substance Use Topics  . Smoking status: Never Smoker   . Smokeless tobacco: Not on file  . Alcohol Use: Yes     Comment: occ   OB History   Grav Para Term Preterm Abortions TAB SAB Ect Mult Living   7 2  Obstetric Comments   3 ectopic     Review of Systems  Constitutional: Positive for fever (subjective), chills and diaphoresis. Negative for fatigue.  HENT: Negative for congestion and drooling.   Eyes: Negative for pain.  Respiratory: Negative for cough and shortness of breath.   Cardiovascular: Positive for chest pain.  Gastrointestinal: Positive for nausea. Negative for vomiting, abdominal pain and diarrhea.  Genitourinary: Negative for dysuria and hematuria.  Musculoskeletal: Negative for neck pain.  Skin: Negative for color change.  Neurological: Negative for dizziness and headaches.  Hematological: Negative for adenopathy.  Psychiatric/Behavioral: Negative for behavioral problems.  All other systems reviewed and are negative.     Allergies  Codeine phosphate; Moxifloxacin; and Sulfamethoxazole  Home  Medications   Prior to Admission medications   Medication Sig Start Date End Date Taking? Authorizing Provider  loratadine (CLARITIN) 10 MG tablet Take 10 mg by mouth daily.   Yes Historical Provider, MD  amoxicillin-clavulanate (AUGMENTIN) 875-125 MG per tablet Take 1 tablet by mouth every 12 (twelve) hours. 02/07/13   Burnis Medin, MD  Aspirin-Caffeine (BC FAST PAIN RELIEF) 845-65 MG PACK Take 65 mg by mouth every 8 (eight) hours as needed. Headache    Historical Provider, MD  clindamycin (CLEOCIN) 300 MG capsule Take 1 capsule (300 mg total) by mouth 3 (three) times daily. 02/28/13   Burnis Medin, MD  ibuprofen (ADVIL,MOTRIN) 200 MG tablet Take 1,000 mg by mouth every 6 (six) hours as needed for pain (headache).    Historical Provider, MD  Multiple Vitamins-Minerals (MULTIVITAMIN PO) Take by mouth.    Historical Provider, MD  Pyridoxine-Kelp-Lect-Vinegar (KLB6 PO) Take by mouth.    Historical Provider, MD  triamterene-hydrochlorothiazide (MAXZIDE-25) 37.5-25 MG per tablet TAKE ONE-HALF TABLET BY MOUTH ONCE DAILY    Burnis Medin, MD  vitamin B-12 (CYANOCOBALAMIN) 50 MCG tablet Taking 4-10 tablets daily    Historical Provider, MD   Triage Vitals: BP 145/77  Pulse 98  Temp(Src) 98.8 F (37.1 C) (Oral)  Resp 18  Ht 5\' 6"  (1.676 m)  Wt 236 lb (107.049 kg)  BMI 38.11 kg/m2  SpO2 99%  LMP 08/10/2013 Physical Exam  Nursing note and vitals reviewed. Constitutional: She is oriented to person, place, and time. She appears well-developed and well-nourished. No distress.  HENT:  Head: Normocephalic and atraumatic.  Mouth/Throat: Oropharynx is clear and moist. No oropharyngeal exudate.  Eyes: Conjunctivae and EOM are normal. Pupils are equal, round, and reactive to light.  Neck: Normal range of motion. Neck supple.  Cardiovascular: Normal rate, regular rhythm, normal heart sounds and intact distal pulses.   No murmur heard. Pulmonary/Chest: Effort normal and breath sounds normal. No  respiratory distress.  Abdominal: Soft. There is no tenderness. There is no rebound and no guarding.  Musculoskeletal: Normal range of motion. She exhibits no edema and no tenderness.  Neurological: She is alert and oriented to person, place, and time. No cranial nerve deficit. She exhibits normal muscle tone. Coordination normal.  Skin: Skin is warm.  Psychiatric: She has a normal mood and affect. Her behavior is normal.    ED Course  Procedures (including critical care time) DIAGNOSTIC STUDIES: Oxygen Saturation is 99% on RA, normal by my interpretation.    COORDINATION OF CARE: 9:27 PM- Will order 4 mG Zofran and GI Cocktail/ Patient informed of current plan for treatment and evaluation and agrees with plan at this time.  Labs Review Labs Reviewed  URINALYSIS, ROUTINE W REFLEX MICROSCOPIC - Abnormal; Notable for the  following:    Hgb urine dipstick TRACE (*)    All other components within normal limits  CBC WITH DIFFERENTIAL - Abnormal; Notable for the following:    WBC 11.3 (*)    Lymphs Abs 5.2 (*)    All other components within normal limits  COMPREHENSIVE METABOLIC PANEL - Abnormal; Notable for the following:    Potassium 3.6 (*)    Glucose, Bld 115 (*)    Total Bilirubin <0.2 (*)    GFR calc non Af Amer 61 (*)    GFR calc Af Amer 71 (*)    All other components within normal limits  PREGNANCY, URINE  URINE MICROSCOPIC-ADD ON  TROPONIN I    Imaging Review Dg Chest 2 View  08/21/2013   CLINICAL DATA:  Chest pain.  EXAM: CHEST  2 VIEW  COMPARISON:  05/10/2011  FINDINGS: Heart size and pulmonary vascularity are normal. There is minimal atelectasis at the left lung base. Lungs are otherwise clear. No effusions. No osseous abnormality.  IMPRESSION: Minimal atelectasis at the left lung base.   Electronically Signed   By: Rozetta Nunnery M.D.   On: 08/21/2013 21:22     EKG Interpretation Date/Time:  Tuesday August 21 2013 20:17:50 EDT Ventricular Rate:  100 PR Interval:   166 QRS Duration: 72 QT Interval:  330 QTC Calculation: 425 R Axis:   8 Text Interpretation:  Normal sinus rhythm Possible Left atrial enlargement  No significant change since last tracing Confirmed by Joffre Lucks  MD,  Paxton ([id number]) on 08/21/2013 8:30:35 PM      MDM   Final diagnoses:  Other chest pain    10:36 PM 43 y.o. female pw cp. Initially started as bloating/belching. RF's include HLP and FH. Pt doesn't smoke, no hx of heart problems. Atypical for cardiac cause. Labs/imaging unremarkable. Pt denies sob. Low risk for MACE per HEART score. Pt comfortable following up w/ pcp.   10:36 PM:  I have discussed the diagnosis/risks/treatment options with the patient and believe the pt to be eligible for discharge home to follow-up with her pcp in 1-2 days. We also discussed returning to the ED immediately if new or worsening sx occur. We discussed the sx which are most concerning (e.g., worsening pain, sob, fever) that necessitate immediate return. Medications administered to the patient during their visit and any new prescriptions provided to the patient are listed below.  Medications given during this visit Medications  ondansetron (ZOFRAN) injection 4 mg (4 mg Intravenous Given 08/21/13 2131)  gi cocktail (Maalox,Lidocaine,Donnatal) (30 mLs Oral Given 08/21/13 2131)  sodium chloride 0.9 % bolus 500 mL (500 mLs Intravenous New Bag/Given 08/21/13 2131)    New Prescriptions   No medications on file      I personally performed the services described in this documentation, which was scribed in my presence. The recorded information has been reviewed and is accurate.    Blanchard Kelch, MD 08/22/13 1011

## 2013-08-21 NOTE — ED Notes (Addendum)
Pt checked in for fever and shoulder pain.  When in triage, reports L chest wall pain today with radiation to L shoulder area.  Reports thought it was reflux, took zantac without relief.  States also with numbness in right thumb (acute on chronic).  Denies sob, sweating, nausea.  Also reports started having "chills" and subjective fever when she got home.

## 2013-08-21 NOTE — Discharge Instructions (Signed)

## 2013-08-27 ENCOUNTER — Encounter (HOSPITAL_COMMUNITY): Payer: Self-pay | Admitting: Emergency Medicine

## 2013-08-27 ENCOUNTER — Emergency Department (HOSPITAL_COMMUNITY)
Admission: EM | Admit: 2013-08-27 | Discharge: 2013-08-27 | Disposition: A | Discharge status: Home / Self Care | Payer: No Typology Code available for payment source | Attending: Emergency Medicine | Admitting: Emergency Medicine

## 2013-08-27 ENCOUNTER — Emergency Department (HOSPITAL_COMMUNITY): Payer: No Typology Code available for payment source

## 2013-08-27 ENCOUNTER — Telehealth: Payer: Self-pay | Admitting: Internal Medicine

## 2013-08-27 DIAGNOSIS — Z8679 Personal history of other diseases of the circulatory system: Secondary | ICD-10-CM | POA: Insufficient documentation

## 2013-08-27 DIAGNOSIS — Z8639 Personal history of other endocrine, nutritional and metabolic disease: Secondary | ICD-10-CM | POA: Insufficient documentation

## 2013-08-27 DIAGNOSIS — Z8659 Personal history of other mental and behavioral disorders: Secondary | ICD-10-CM | POA: Insufficient documentation

## 2013-08-27 DIAGNOSIS — Z79899 Other long term (current) drug therapy: Secondary | ICD-10-CM | POA: Insufficient documentation

## 2013-08-27 DIAGNOSIS — D649 Anemia, unspecified: Secondary | ICD-10-CM | POA: Insufficient documentation

## 2013-08-27 DIAGNOSIS — Z792 Long term (current) use of antibiotics: Secondary | ICD-10-CM | POA: Insufficient documentation

## 2013-08-27 DIAGNOSIS — Z8742 Personal history of other diseases of the female genital tract: Secondary | ICD-10-CM | POA: Insufficient documentation

## 2013-08-27 DIAGNOSIS — R0789 Other chest pain: Secondary | ICD-10-CM | POA: Insufficient documentation

## 2013-08-27 DIAGNOSIS — Z791 Long term (current) use of non-steroidal anti-inflammatories (NSAID): Secondary | ICD-10-CM | POA: Insufficient documentation

## 2013-08-27 DIAGNOSIS — Z8709 Personal history of other diseases of the respiratory system: Secondary | ICD-10-CM | POA: Insufficient documentation

## 2013-08-27 DIAGNOSIS — Z862 Personal history of diseases of the blood and blood-forming organs and certain disorders involving the immune mechanism: Secondary | ICD-10-CM | POA: Insufficient documentation

## 2013-08-27 DIAGNOSIS — R0602 Shortness of breath: Secondary | ICD-10-CM | POA: Insufficient documentation

## 2013-08-27 DIAGNOSIS — R079 Chest pain, unspecified: Secondary | ICD-10-CM | POA: Insufficient documentation

## 2013-08-27 DIAGNOSIS — Z8719 Personal history of other diseases of the digestive system: Secondary | ICD-10-CM | POA: Insufficient documentation

## 2013-08-27 LAB — BASIC METABOLIC PANEL
ANION GAP: 15 (ref 5–15)
BUN: 11 mg/dL (ref 6–23)
CALCIUM: 8.9 mg/dL (ref 8.4–10.5)
CO2: 21 meq/L (ref 19–32)
Chloride: 103 mEq/L (ref 96–112)
Creatinine, Ser: 0.86 mg/dL (ref 0.50–1.10)
GFR calc Af Amer: >90 mL/min (ref >90)
GFR calc non Af Amer: 82 mL/min — ABNORMAL LOW (ref >90)
Glucose, Bld: 93 mg/dL (ref 70–99)
Potassium: 4 mEq/L (ref 3.7–5.3)
Sodium: 139 mEq/L (ref 137–147)

## 2013-08-27 LAB — CBC
HCT: 37.9 % (ref 36.0–46.0)
Hemoglobin: 12.8 g/dL (ref 12.0–15.0)
MCH: 30 pg (ref 26.0–34.0)
MCHC: 33.8 g/dL (ref 30.0–36.0)
MCV: 89 fL (ref 78.0–100.0)
PLATELETS: 427 10*3/uL — AB (ref 150–400)
RBC: 4.26 MIL/uL (ref 3.87–5.11)
RDW: 13.6 % (ref 11.5–15.5)
WBC: 10.1 10*3/uL (ref 4.0–10.5)

## 2013-08-27 LAB — I-STAT TROPONIN, ED: Troponin i, poc: 0.01 ng/mL (ref 0.00–0.08)

## 2013-08-27 LAB — TROPONIN I

## 2013-08-27 LAB — D-DIMER, QUANTITATIVE: D-Dimer, Quant: 0.35 ug/mL-FEU (ref 0.00–0.48)

## 2013-08-27 LAB — PRO B NATRIURETIC PEPTIDE: PRO B NATRI PEPTIDE: 27.6 pg/mL (ref 0–125)

## 2013-08-27 NOTE — Discharge Instructions (Signed)
Chest Pain (Nonspecific)   there is no evidence of heart attack or blood clot in the lung. You should have a stress test because of your family history. Follow up with your doctor for this. Return to the ED if you develop new or worsening symptoms.   It is often hard to give a specific diagnosis for the cause of chest pain. There is always a chance that your pain could be related to something serious, such as a heart attack or a blood clot in the lungs. You need to follow up with your health care provider for further evaluation. CAUSES   Heartburn.  Pneumonia or bronchitis.  Anxiety or stress.  Inflammation around your heart (pericarditis) or lung (pleuritis or pleurisy).  A blood clot in the lung.  A collapsed lung (pneumothorax). It can develop suddenly on its own (spontaneous pneumothorax) or from trauma to the chest.  Shingles infection (herpes zoster virus). The chest wall is composed of bones, muscles, and cartilage. Any of these can be the source of the pain.  The bones can be bruised by injury.  The muscles or cartilage can be strained by coughing or overwork.  The cartilage can be affected by inflammation and become sore (costochondritis). DIAGNOSIS  Lab tests or other studies may be needed to find the cause of your pain. Your health care provider may have you take a test called an ambulatory electrocardiogram (ECG). An ECG records your heartbeat patterns over a 24-hour period. You may also have other tests, such as:  Transthoracic echocardiogram (TTE). During echocardiography, sound waves are used to evaluate how blood flows through your heart.  Transesophageal echocardiogram (TEE).  Cardiac monitoring. This allows your health care provider to monitor your heart rate and rhythm in real time.  Holter monitor. This is a portable device that records your heartbeat and can help diagnose heart arrhythmias. It allows your health care provider to track your heart activity for  several days, if needed.  Stress tests by exercise or by giving medicine that makes the heart beat faster. TREATMENT   Treatment depends on what may be causing your chest pain. Treatment may include:  Acid blockers for heartburn.  Anti-inflammatory medicine.  Pain medicine for inflammatory conditions.  Antibiotics if an infection is present.  You may be advised to change lifestyle habits. This includes stopping smoking and avoiding alcohol, caffeine, and chocolate.  You may be advised to keep your head raised (elevated) when sleeping. This reduces the chance of acid going backward from your stomach into your esophagus. Most of the time, nonspecific chest pain will improve within 2-3 days with rest and mild pain medicine.  HOME CARE INSTRUCTIONS   If antibiotics were prescribed, take them as directed. Finish them even if you start to feel better.  For the next few days, avoid physical activities that bring on chest pain. Continue physical activities as directed.  Do not use any tobacco products, including cigarettes, chewing tobacco, or electronic cigarettes.  Avoid drinking alcohol.  Only take medicine as directed by your health care provider.  Follow your health care provider's suggestions for further testing if your chest pain does not go away.  Keep any follow-up appointments you made. If you do not go to an appointment, you could develop lasting (chronic) problems with pain. If there is any problem keeping an appointment, call to reschedule. SEEK MEDICAL CARE IF:   Your chest pain does not go away, even after treatment.  You have a rash with blisters on  your chest.  You have a fever. SEEK IMMEDIATE MEDICAL CARE IF:   You have increased chest pain or pain that spreads to your arm, neck, jaw, back, or abdomen.  You have shortness of breath.  You have an increasing cough, or you cough up blood.  You have severe back or abdominal pain.  You feel nauseous or  vomit.  You have severe weakness.  You faint.  You have chills. This is an emergency. Do not wait to see if the pain will go away. Get medical help at once. Call your local emergency services (911 in [place] Do not drive yourself to the hospital. MAKE SURE YOU:   Understand these instructions.  Will watch your condition.  Will get help right away if you are not doing well or get worse. Document Released: 10/21/2004 Document Revised: 01/16/2013 Document Reviewed: 08/17/2007 [facility] Patient Information 2015 [place], [place]. This information is not intended to replace advice given to you by your health care provider. Make sure you discuss any questions you have with your health care provider.

## 2013-08-27 NOTE — ED Provider Notes (Signed)
CSN: [id number]     Arrival date & time 08/27/13  1721 History   First MD Initiated Contact with Patient 08/27/13 1932     Chief Complaint  Patient presents with  . Chest Pain     (Consider location/radiation/quality/duration/timing/severity/associated sxs/prior Treatment) HPI Comments: Patient presents with ongoing back and chest pain since July 28. She was seen in the ED and told she had atypical chest pain and sent home. She says the pain has been constant since but gradually improving. It is better with lying down worse with sitting forward. She denies any shortness of breath, nausea, vomiting, cough or fever. She denies any leg pain or leg swelling. Her father died of a heart attack at age 69. She had a stress test in 2000 and that was negative. The pain is never gone away since July 28. She called the nurse line today and was referred back to the ED.  The history is provided by the patient.    Past Medical History  Diagnosis Date  . Anxiety   . Chest pain     neg stress test summer 2010  . Migraines   . Endometriosis   . Sinusitis   . Ovarian torsion   . Allergic rhinitis     hx of testing and allergic to spring and fall f= pollens  . Anemia     nos  . GERD (gastroesophageal reflux disease)   . Endometriosis   . Hyperlipidemia   . Headache(784.0)   . Labial cyst 04/08/2011    early infection use hotpcompresses     Past Surgical History  Procedure Laterality Date  . Abdominal surgery    . Oophrectomy    . Cesarean section    . Tubal ligation    . Unilateral salpingectomy      right  age 29 ectopics.   Family History  Problem Relation Age of Onset  . Anxiety disorder Mother     mother is on xanax and apparently applying for  social security diabilty for her anxiety. t  . Coronary artery disease    . Heart attack Father   . Diabetes    . Hyperlipidemia    . Other      history of child death in family    History  Substance Use Topics  . Smoking status: Never  Smoker   . Smokeless tobacco: Not on file  . Alcohol Use: Yes     Comment: occ   OB History   Grav Para Term Preterm Abortions TAB SAB Ect Mult Living   7 2             Obstetric Comments   3 ectopic     Review of Systems  Constitutional: Negative for fever, activity change and appetite change.  Respiratory: Positive for shortness of breath. Negative for chest tightness.   Cardiovascular: Positive for chest pain.  Gastrointestinal: Negative for nausea, vomiting and abdominal pain.  Genitourinary: Negative for dysuria and hematuria.  Musculoskeletal: Negative for arthralgias, back pain and myalgias.  Skin: Negative for rash.  Neurological: Negative for dizziness, weakness and headaches.  A complete 10 system review of systems was obtained and all systems are negative except as noted in the HPI and PMH.      Allergies  Codeine phosphate; Moxifloxacin; and Sulfamethoxazole  Home Medications   Prior to Admission medications   Medication Sig Start Date End Date Taking? Authorizing Provider  Aspirin-Caffeine (BC FAST PAIN RELIEF) 845-65 MG PACK Take 65 mg by  mouth every 8 (eight) hours as needed. Headache   Yes Historical Provider, MD  ibuprofen (ADVIL,MOTRIN) 200 MG tablet Take 1,000 mg by mouth every 6 (six) hours as needed for moderate pain (headache).    Yes Historical Provider, MD  loratadine (CLARITIN) 10 MG tablet Take 10 mg by mouth daily.   Yes Historical Provider, MD  Multiple Vitamins-Minerals (MULTIVITAMIN PO) Take 1 tablet by mouth daily.    Yes Historical Provider, MD  Pyridoxine-Kelp-Lect-Vinegar (KLB6 PO) Take 1 tablet by mouth daily.    Yes Historical Provider, MD  triamterene-hydrochlorothiazide (MAXZIDE-25) 37.5-25 MG per tablet TAKE ONE-HALF TABLET BY MOUTH ONCE DAILY   Yes Burnis Medin, MD  vitamin B-12 (CYANOCOBALAMIN) 50 MCG tablet Take 50 mcg by mouth daily.    Yes Historical Provider, MD  amoxicillin-clavulanate (AUGMENTIN) 875-125 MG per tablet Take 1  tablet by mouth every 12 (twelve) hours. Patient cannot remember when she completed course of medication 02/07/13   Burnis Medin, MD  clindamycin (CLEOCIN) 300 MG capsule Take 300 mg by mouth 3 (three) times daily. Patient cannot remember when she completed course of medication 02/28/13   Burnis Medin, MD   BP 120/74  Pulse 68  Temp(Src) 97.8 F (36.6 C) (Oral)  Resp 23  Ht 5\' 6"  (1.676 m)  Wt 236 lb (107.049 kg)  BMI 38.11 kg/m2  SpO2 100%  LMP 08/10/2013 Physical Exam  Nursing note and vitals reviewed. Constitutional: She is oriented to person, place, and time. She appears well-developed and well-nourished. No distress.  HENT:  Head: Normocephalic and atraumatic.  Mouth/Throat: Oropharynx is clear and moist. No oropharyngeal exudate.  Eyes: Conjunctivae and EOM are normal. Pupils are equal, round, and reactive to light.  Neck: Normal range of motion. Neck supple.  No meningismus.  Cardiovascular: Normal rate, regular rhythm, normal heart sounds and intact distal pulses.   No murmur heard. Pulmonary/Chest: Effort normal and breath sounds normal. No respiratory distress. She exhibits tenderness.  TTP L sternal border  Abdominal: Soft. There is no tenderness. There is no rebound and no guarding.  Musculoskeletal: Normal range of motion. She exhibits tenderness. She exhibits no edema.  TTP L paraspinal muscles Pain not worse with arm movement  Neurological: She is alert and oriented to person, place, and time. No cranial nerve deficit. She exhibits normal muscle tone. Coordination normal.  No ataxia on finger to nose bilaterally. No pronator drift. 5/5 strength throughout. CN 2-12 intact. Negative Romberg. Equal grip strength. Sensation intact. Gait is normal.   Skin: Skin is warm.  Psychiatric: She has a normal mood and affect. Her behavior is normal.    ED Course  Procedures (including critical care time) Labs Review Labs Reviewed  CBC - Abnormal; Notable for the following:     Platelets 427 (*)    All other components within normal limits  BASIC METABOLIC PANEL - Abnormal; Notable for the following:    GFR calc non Af Amer 82 (*)    All other components within normal limits  PRO B NATRIURETIC PEPTIDE  TROPONIN I  D-DIMER, QUANTITATIVE  I-STAT TROPOININ, ED    Imaging Review Dg Chest 2 View  08/27/2013   CLINICAL DATA:  Chest pain  EXAM: CHEST  2 VIEW  COMPARISON:  August 21, 2013  FINDINGS: There is no edema or consolidation. Heart size and pulmonary vascularity are normal. No adenopathy. No pneumothorax. No bone lesions.  IMPRESSION: No abnormality noted.   Electronically Signed   By: Lowella Grip M.D.  On: 08/27/2013 18:54     EKG Interpretation   Date/Time:  Monday August 27 2013 17:32:50 EDT Ventricular Rate:  88 PR Interval:  172 QRS Duration: 72 QT Interval:  368 QTC Calculation: 445 R Axis:   29 Text Interpretation:  Normal sinus rhythm Normal ECG No significant change  was found Confirmed by Wyvonnia Dusky  MD, Jonel Sick [phone]) on 08/27/2013 7:47:35 PM      MDM   Final diagnoses:  Atypical chest pain   Patient with ongoing upper back and central chest pain since July 28 that is improving. It is better with lying down worse with leaning forward. It is worse with palpation. It is atypical for ACS. Her EKG is normal. Chest x-ray is normal.  First troponin negative. We'll check d-dimer. Patient does have a strong family history of heart disease. Her last stress test was in 2010. Her heart score is 2.  D-dimer negative. Troponin negative x2. Chest x-ray negative. Patient in no distress. Low suspicion for ACS, pulmonary embolism, aortic dissection. Her presentation today is not consistent with ACS. However given her family history of heart disease she would benefit from repeat stress test. She can followup with her doctor for this. Return precautions discussed. Patient expressed understanding with plan.  Ezequiel Essex, MD 08/27/13 2126

## 2013-08-27 NOTE — ED Notes (Signed)
Pt c/o pain in chest; pt seen at [facility] on 7/28 for same and told had small pneumo; pt sts still having pain and sent here for further eval; pt sts some SOB with exertion

## 2013-08-27 NOTE — Telephone Encounter (Signed)
Patient Information:  Caller Name: Jadzia  Phone: [phone]  Patient: [name], [name]  Gender: Female  DOB: 1971-01-10  Age: 43 Years  PCP: [name] [facility])  Pregnant: No  Office Follow Up:  Does the office need to follow up with this patient?: No  Instructions For The Office: N/A  RN Note:  Current chest pain rated 4/10. Feels mildly short of breath when talking or sitting.  Reported wil not call 911; will go to [facility] ED now since it is close.  Symptoms  Reason For Call & Symptoms: Moderate chest pain since 08/21/13.  Was seen at  [facility] 08/21/13.  Diagnosed with mild left atlectasis after EKG, CXR and labs. Was instructed to follow up with PCP.  Called today for return to work note.  Reviewed Health History In EMR: Yes  Reviewed Medications In EMR: Yes  Reviewed Allergies In EMR: Yes  Reviewed Surgeries / Procedures: Yes  Date of Onset of Symptoms: 08/21/2013  Treatments Tried: Zantac, ED evaluation with  CXR, EKG and lab  Treatments Tried Worked: No OB / GYN:  LMP: 08/06/2013  Guideline(s) Used:  Chest Pain  Disposition Per Guideline:   Call EMS 911 Now  Reason For Disposition Reached:   Chest pain lasting longer than 5 minutes and ANY of the following:  Over 86 years old Over 4 years old and at least one cardiac risk factor (i.e., high blood pressure, diabetes, high cholesterol, obesity, smoker or strong family history of heart disease) Pain is crushing, pressure-like, or heavy  Took nitroglycerin and chest pain was not relieved History of heart disease (i.e., angina, heart attack, bypass surgery, angioplasty, CHF)  Advice Given:  N/A  Patient Refused Recommendation:  Patient Will Go To ED  Refused 911; Agreed to go to ED now

## 2013-08-28 NOTE — Telephone Encounter (Signed)
Pt seen in the ED.

## 2013-09-10 ENCOUNTER — Encounter: Payer: Self-pay | Admitting: Obstetrics and Gynecology

## 2013-09-10 ENCOUNTER — Ambulatory Visit (INDEPENDENT_AMBULATORY_CARE_PROVIDER_SITE_OTHER): Payer: No Typology Code available for payment source | Admitting: Obstetrics and Gynecology

## 2013-09-10 VITALS — BP 118/64 | HR 76 | Resp 18 | Ht 65.25 in | Wt 239.6 lb

## 2013-09-10 DIAGNOSIS — N946 Dysmenorrhea, unspecified: Secondary | ICD-10-CM

## 2013-09-10 DIAGNOSIS — Z01419 Encounter for gynecological examination (general) (routine) without abnormal findings: Secondary | ICD-10-CM

## 2013-09-10 DIAGNOSIS — Z0000 Encounter for general adult medical examination without abnormal findings: Secondary | ICD-10-CM

## 2013-09-10 DIAGNOSIS — N921 Excessive and frequent menstruation with irregular cycle: Secondary | ICD-10-CM

## 2013-09-10 LAB — POCT URINALYSIS DIPSTICK
BILIRUBIN UA: NEGATIVE
GLUCOSE UA: NEGATIVE
KETONES UA: NEGATIVE
Leukocytes, UA: NEGATIVE
Nitrite, UA: NEGATIVE
Protein, UA: NEGATIVE
RBC UA: NEGATIVE
UROBILINOGEN UA: NEGATIVE
pH, UA: 5

## 2013-09-10 NOTE — Patient Instructions (Signed)
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
## 2013-09-12 LAB — IPS PAP TEST WITH HPV

## 2013-09-17 ENCOUNTER — Telehealth: Payer: Self-pay | Admitting: Obstetrics and Gynecology

## 2013-09-17 NOTE — Telephone Encounter (Signed)
Attempted to reach patient at number provided [phone]. There was no answer and recording states that the voicemail box is currently full. Will try to reach patient again later.

## 2013-09-17 NOTE — Telephone Encounter (Signed)
Please contact patient regarding her abnormal bleeding pattern.  I do not recommend that she waits until the end of the year to proceed with evaluation.  I do not know the etiology of her bleeding abnormality.

## 2013-09-17 NOTE — Telephone Encounter (Signed)
Spoke with patient. Advised that per benefit quote received, she will be responsible for $60 copay when she comes in for PUS. Patient declines scheduling stating "I would like to put this off until the end of December".

## 2013-09-18 NOTE — Telephone Encounter (Signed)
Spoke with patient. Advised of message as seen below from [place]. Patient states "I do no have bleeding she was just going to follow up with my endometriosis. I could schedule in the middle of October." Appointment scheduled for PUS on Oct 22 at 11:30am with 12pm consult with Dr.Silva. Patient agreeable to date and time.  Routing to provider for final review. Patient agreeable to disposition. Will close encounter

## 2013-09-20 ENCOUNTER — Other Ambulatory Visit: Payer: Self-pay

## 2013-09-20 DIAGNOSIS — R234 Changes in skin texture: Secondary | ICD-10-CM

## 2013-10-02 ENCOUNTER — Ambulatory Visit
Admission: RE | Admit: 2013-10-02 | Discharge: 2013-10-02 | Disposition: A | Discharge status: Home / Self Care | Payer: No Typology Code available for payment source | Source: Ambulatory Visit | Attending: Endocrinology | Admitting: Endocrinology

## 2013-10-02 DIAGNOSIS — E079 Disorder of thyroid, unspecified: Secondary | ICD-10-CM

## 2013-11-12 ENCOUNTER — Encounter: Payer: Self-pay | Admitting: Internal Medicine

## 2013-11-13 ENCOUNTER — Telehealth: Payer: Self-pay | Admitting: Obstetrics and Gynecology

## 2013-11-13 NOTE — Telephone Encounter (Signed)
Patient called to reschedule PUS. Rescheduled for 11.12.2015. Advised patient to bring $60 copay. Patient agreeable. Advised patient of 72 hour cancellation policy and $144 cancellation fee. Patient agreeable.

## 2013-11-13 NOTE — Telephone Encounter (Signed)
Patient would like to reschedule her upcoming PUS appointment 11/15/13. Patient's canceled via auto reminder call 11/12/13 @ 8:33am .

## 2013-11-13 NOTE — Telephone Encounter (Signed)
Closed encounter by accident

## 2013-11-13 NOTE — Telephone Encounter (Signed)
[name] rescheduled patient's ultrasound appointment. Patient is now scheduled for 11/12 at 10:30am with 11am consult with Dr.Silva. Patient is to be seen for PUS for abnormal bleeding. Please see previous telephone encounter from 8/24.  Dr.Silva, okay for patient to wait until 11/12 for PUS?

## 2013-11-14 MED ORDER — CITALOPRAM HYDROBROMIDE 20 MG PO TABS: ORAL_TABLET | ORAL | Status: DC

## 2013-11-14 MED ORDER — TRIAMTERENE-HCTZ 37.5-25 MG PO TABS: ORAL_TABLET | ORAL | Status: DC

## 2013-11-14 NOTE — Telephone Encounter (Signed)
OK to wait until 11/12 for appointment, but needs to keep appointment.  She may also need an endometrial biopsy.

## 2013-11-14 NOTE — Telephone Encounter (Signed)
We can increase to once a day  tmp/hctz disp 90 no refills   but take same dose every day    dont recommend going up and down on the diuretic as it can cause rebound swelling and make things worse.   Can restart celexa  1/2 po qd   Then can increase to 1 po qd disp 30  No refill.  return office visit  In 3-4 weeks  To assess labs and effect on both meds

## 2013-11-15 ENCOUNTER — Other Ambulatory Visit: Payer: No Typology Code available for payment source

## 2013-11-15 ENCOUNTER — Other Ambulatory Visit: Payer: No Typology Code available for payment source | Admitting: Obstetrics and Gynecology

## 2013-11-16 NOTE — Telephone Encounter (Signed)
Noted. Patient remains scheduled for appointment on 11/12 at 10:30am with 11am consult.  Routing to provider for final review. Patient agreeable to disposition. Will close encounter

## 2013-11-26 ENCOUNTER — Encounter: Payer: Self-pay | Admitting: Obstetrics and Gynecology

## 2013-12-03 ENCOUNTER — Ambulatory Visit
Admission: RE | Admit: 2013-12-03 | Discharge: 2013-12-03 | Disposition: A | Discharge status: Home / Self Care | Payer: No Typology Code available for payment source | Source: Ambulatory Visit | Attending: Obstetrics and Gynecology | Admitting: Obstetrics and Gynecology

## 2013-12-03 ENCOUNTER — Other Ambulatory Visit: Payer: Self-pay | Admitting: Obstetrics and Gynecology

## 2013-12-03 ENCOUNTER — Ambulatory Visit: Payer: No Typology Code available for payment source

## 2013-12-03 DIAGNOSIS — Z1231 Encounter for screening mammogram for malignant neoplasm of breast: Secondary | ICD-10-CM

## 2013-12-06 ENCOUNTER — Other Ambulatory Visit: Payer: No Typology Code available for payment source

## 2013-12-06 ENCOUNTER — Other Ambulatory Visit: Payer: No Typology Code available for payment source | Admitting: Obstetrics and Gynecology

## 2013-12-07 ENCOUNTER — Encounter: Payer: Self-pay | Admitting: Obstetrics and Gynecology

## 2013-12-13 ENCOUNTER — Ambulatory Visit: Payer: Self-pay | Admitting: Internal Medicine

## 2013-12-14 ENCOUNTER — Telehealth: Payer: Self-pay | Admitting: Obstetrics and Gynecology

## 2013-12-14 NOTE — Telephone Encounter (Signed)
Left message upcoming appointment 08/2014 time has changed.

## 2013-12-24 ENCOUNTER — Encounter: Payer: No Typology Code available for payment source | Admitting: Internal Medicine

## 2013-12-24 ENCOUNTER — Other Ambulatory Visit: Payer: Self-pay | Admitting: Internal Medicine

## 2013-12-24 NOTE — Progress Notes (Signed)
Document opened and reviewed for OV but appt  canceled same day .  

## 2013-12-24 NOTE — Telephone Encounter (Signed)
Sent to the pharmacy by e-scribe.  Pt has upcoming appt on 01/17/14

## 2014-01-17 ENCOUNTER — Ambulatory Visit (INDEPENDENT_AMBULATORY_CARE_PROVIDER_SITE_OTHER): Payer: No Typology Code available for payment source | Admitting: Internal Medicine

## 2014-01-17 ENCOUNTER — Encounter: Payer: Self-pay | Admitting: Internal Medicine

## 2014-01-17 VITALS — BP 120/76 | HR 68 | Temp 98.0°F | Wt 237.8 lb

## 2014-01-17 DIAGNOSIS — J329 Chronic sinusitis, unspecified: Secondary | ICD-10-CM

## 2014-01-17 DIAGNOSIS — R635 Abnormal weight gain: Secondary | ICD-10-CM

## 2014-01-17 DIAGNOSIS — Z79899 Other long term (current) drug therapy: Secondary | ICD-10-CM

## 2014-01-17 DIAGNOSIS — F4322 Adjustment disorder with anxiety: Secondary | ICD-10-CM

## 2014-01-17 MED ORDER — CITALOPRAM HYDROBROMIDE 20 MG PO TABS: 20.0000 mg | ORAL_TABLET | Freq: Every day | ORAL | Status: DC

## 2014-01-17 MED ORDER — FLUTICASONE PROPIONATE 50 MCG/ACT NA SUSP: 2.0000 | Freq: Every day | NASAL | Status: DC

## 2014-01-17 NOTE — Patient Instructions (Signed)
Try  Otc nasacort  First  And if tolerated better can do rx for that instead of the flonase with burns the nose.   consider taking  Antihistamine before  Home visits to avoid exposure..  Separate the sugar and caffiene .  To get rid of the sugar drinks and sodas.      Why follow it? Research shows. . Those who follow the Mediterranean diet have a reduced risk of heart disease  . The diet is associated with a reduced incidence of Parkinson's and Alzheimer's diseases . People following the diet may have longer life expectancies and lower rates of chronic diseases  . The Dietary Guidelines for Americans recommends the Mediterranean diet as an eating plan to promote health and prevent disease  What Is the Mediterranean Diet?  . Healthy eating plan based on typical foods and recipes of Mediterranean-style cooking . The diet is primarily a plant based diet; these foods should make up a majority of meals   Starches - Plant based foods should make up a majority of meals - They are an important sources of vitamins, minerals, energy, antioxidants, and fiber - Choose whole grains, foods high in fiber and minimally processed items  - Typical grain sources include wheat, oats, barley, corn, brown rice, bulgar, farro, millet, polenta, couscous  - Various types of beans include chickpeas, lentils, fava beans, black beans, white beans   Fruits  Veggies - Large quantities of antioxidant rich fruits & veggies; 6 or more servings  - Vegetables can be eaten raw or lightly drizzled with oil and cooked  - Vegetables common to the traditional Mediterranean Diet include: artichokes, arugula, beets, broccoli, brussel sprouts, cabbage, carrots, celery, collard greens, cucumbers, eggplant, kale, leeks, lemons, lettuce, mushrooms, okra, onions, peas, peppers, potatoes, pumpkin, radishes, rutabaga, shallots, spinach, sweet potatoes, turnips, zucchini - Fruits common to the Mediterranean Diet include: apples, apricots,  avocados, cherries, clementines, dates, figs, grapefruits, grapes, melons, nectarines, oranges, peaches, pears, pomegranates, strawberries, tangerines  Fats - Replace butter and margarine with healthy oils, such as olive oil, canola oil, and tahini  - Limit nuts to no more than a handful a day  - Nuts include walnuts, almonds, pecans, pistachios, pine nuts  - Limit or avoid candied, honey roasted or heavily salted nuts - Olives are central to the [employer] - can be eaten whole or used in a variety of dishes   Meats Protein - Limiting red meat: no more than a few times a month - When eating red meat: choose lean cuts and keep the portion to the size of deck of cards - Eggs: approx. 0 to 4 times a week  - Fish and lean poultry: at least 2 a week  - Healthy protein sources include, chicken, [place], lean beef, lamb - Increase intake of seafood such as tuna, salmon, trout, mackerel, shrimp, scallops - Avoid or limit high fat processed meats such as sausage and bacon  Dairy - Include moderate amounts of low fat dairy products  - Focus on healthy dairy such as fat free yogurt, skim milk, low or reduced fat cheese - Limit dairy products higher in fat such as whole or 2% milk, cheese, ice cream  Alcohol - Moderate amounts of red wine is ok  - No more than 5 oz daily for women (all ages) and men older than age 77  - No more than 10 oz of wine daily for men younger than 11  Other - Limit sweets and other desserts  -  Use herbs and spices instead of salt to flavor foods  - Herbs and spices common to the traditional Mediterranean Diet include: basil, bay leaves, chives, cloves, cumin, fennel, garlic, lavender, marjoram, mint, oregano, parsley, pepper, rosemary, sage, savory, sumac, tarragon, thyme   It's not just a diet, it's a lifestyle:  . The Mediterranean diet includes lifestyle factors typical of those in the region  . Foods, drinks and meals are best eaten with others and savored . Daily  physical activity is important for overall good health . This could be strenuous exercise like running and aerobics . This could also be more leisurely activities such as walking, housework, yard-work, or taking the stairs . Moderation is the key; a balanced and healthy diet accommodates most foods and drinks . Consider portion sizes and frequency of consumption of certain foods   Meal Ideas & Options:  . Breakfast:  o Whole wheat toast or whole wheat English muffins with peanut butter & hard boiled egg o Steel cut oats topped with apples & cinnamon and skim milk  o Fresh fruit: banana, strawberries, melon, berries, peaches  o Smoothies: strawberries, bananas, greek yogurt, peanut butter o Low fat greek yogurt with blueberries and granola  o Egg white omelet with spinach and mushrooms o Breakfast couscous: whole wheat couscous, apricots, skim milk, cranberries  . Sandwiches:  o Hummus and grilled vegetables (peppers, zucchini, squash) on whole wheat bread   o Grilled chicken on whole wheat pita with lettuce, tomatoes, cucumbers or tzatziki  o Tuna salad on whole wheat bread: tuna salad made with greek yogurt, olives, red peppers, capers, green onions o Garlic rosemary lamb pita: lamb sauted with garlic, rosemary, salt & pepper; add lettuce, cucumber, greek yogurt to pita - flavor with lemon juice and black pepper  . Seafood:  o Mediterranean grilled salmon, seasoned with garlic, basil, parsley, lemon juice and black pepper o Shrimp, lemon, and spinach whole-grain pasta salad made with low fat greek yogurt  o Seared scallops with lemon orzo  o Seared tuna steaks seasoned salt, pepper, coriander topped with tomato mixture of olives, tomatoes, olive oil, minced garlic, parsley, green onions and cappers  . Meats:  o Herbed greek chicken salad with kalamata olives, cucumber, feta  o Red bell peppers stuffed with spinach, bulgur, lean ground beef (or lentils) & topped with feta   o Kebabs:  skewers of chicken, tomatoes, onions, zucchini, squash  o [place] burgers: made with red onions, mint, dill, lemon juice, feta cheese topped with roasted red peppers . Vegetarian o Cucumber salad: cucumbers, artichoke hearts, celery, red onion, feta cheese, tossed in olive oil & lemon juice  o Hummus and whole grain pita points with a greek salad (lettuce, tomato, feta, olives, cucumbers, red onion) o Lentil soup with celery, carrots made with vegetable broth, garlic, salt and pepper  o Tabouli salad: parsley, bulgur, mint, scallions, cucumbers, tomato, radishes, lemon juice, olive oil, salt and pepper.

## 2014-01-17 NOTE — Progress Notes (Signed)
Chief Complaint  Patient presents with  . follow up medication  . Sinusitis    HPI: [name] 43 y.o.  Last seen  Jan 15 for tonsillitis    celexa restarted after ed visits for cp and noted anxiety  Has been on celexa 20 for about 7 weeks and feels much better like herself and wonders why she went off.     Also having chronic  since face pain  Off and on and sort congestion  No fever  Cough   Congestion.  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  Past Medical History  Diagnosis Date  . Anxiety   . Chest pain     neg stress test summer 2010  . Migraines   . Endometriosis   . Sinusitis   . Ovarian torsion   . Allergic rhinitis     hx of testing and allergic to spring and fall f= pollens  . Anemia     nos  . GERD (gastroesophageal reflux disease)   . Endometriosis   . Hyperlipidemia   . Headache(784.0)   . Labial cyst 04/08/2011    early infection use hotpcompresses    . Thyroid disease     goiter  . STD (sexually transmitted disease)     Pos. Chlamydia--in her 26's    Family History  Problem Relation Age of Onset  . Anxiety disorder Mother     mother is on xanax and apparently applying for  social security diabilty for her anxiety. t  . Depression Mother   . Diabetes Mother   . Hypertension Mother   . Coronary artery disease    . Heart attack Father   . Seizures Father   . Diabetes    . Hyperlipidemia    . Other      history of child death in family   . Stroke Maternal Grandmother   . Diabetes Paternal Grandmother   . Hypertension Paternal Grandmother     History   Social History  . Marital Status: Single     Spouse Name: N/A    Number of Children: N/A  . Years of Education: N/A   Social History Main Topics  . Smoking status: Never Smoker   . Smokeless tobacco: None  . Alcohol Use: 0.5 oz/week    1 drink(s) per week     Comment: occ  . Drug Use: No  . Sexual Activity:    Partners: Male    Birth Control/ Protection: Surgical     Comment: Tubal   Other Topics Concern  . 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   And graduated as SW.  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 Aspirin-Caffeine (BC FAST PAIN RELIEF) 845-65 MG PACK Take 65 mg by mouth every 8 (eight) hours as needed. Headache  . citalopram (CELEXA) 20 MG tablet Take 1 tablet (20 mg total) by mouth daily.  Marland Kitchen ibuprofen (ADVIL,MOTRIN) 200 MG tablet Take 1,000 mg by mouth every 6 (six) hours as needed for moderate pain (headache).   . loratadine (CLARITIN) 10 MG tablet Take 10 mg by mouth daily.  . Multiple Vitamins-Minerals (MULTIVITAMIN PO) Take 1 tablet by mouth daily.   Marland Kitchen triamterene-hydrochlorothiazide (MAXZIDE-25) 37.5-25 MG per tablet TAKE 1 TABLET BY MOUTH EVERY DAY  . [DISCONTINUED] citalopram (CELEXA) 20 MG tablet TAKE 1/2 TAB DAILY X1 WEEK THEN 1 TAB DAILY  . fluticasone (FLONASE) 50 MCG/ACT nasal spray Place 2 sprays into both nostrils daily.  . [DISCONTINUED] vitamin B-12 (CYANOCOBALAMIN) 50 MCG tablet Take 50 mcg by mouth daily.     EXAM:  BP 120/76 mmHg  Pulse 68  Temp(Src) 98 F (36.7 C) (Oral)  Wt 237  lb 12.8 oz (107.865 kg)  SpO2 98%  Body mass index is 39.29 kg/(m^2).  GENERAL: vitals reviewed and listed above, alert, oriented, appears well hydrated and in no acute distress HEENT: atraumatic, conjunctiva  clear, no obvious abnormalities on inspection of external nose and ears congested face right slight tenderness  OP : no lesion edema or exudate  Lymphoid  tracts  NECK: no obvious masses on inspection   Thyroid pal  LUNGS: clear to auscultation bilaterally, no wheezes, rales or rhonchi, good air movement CV: HRRR, no clubbing cyanosis or  peripheral edema nl cap refill  MS: moves all extremities without noticeable focal  abnormality PSYCH: pleasant and cooperative, no obvious depression or anxiety Lab Results  Component Value Date   WBC 10.1 08/27/2013   HGB 12.8 08/27/2013   HCT 37.9 08/27/2013   PLT 427* 08/27/2013   GLUCOSE 93 08/27/2013   CHOL 209* 04/08/2011   TRIG 192.0* 04/08/2011   HDL 45.80 04/08/2011   LDLDIRECT 129.7 04/08/2011   LDLCALC 147* 08/24/2006   ALT 16 08/21/2013   AST 18 08/21/2013   NA 139 08/27/2013   K 4.0 08/27/2013   CL 103 08/27/2013   CREATININE 0.86 08/27/2013   BUN 11 08/27/2013   CO2 21 08/27/2013   TSH 1.18 02/07/2013   HGBA1C 5.8 08/24/2006   Wt Readings from Last 3 Encounters:  01/17/14 237 lb 12.8 oz (107.865 kg)  09/10/13 239 lb 9.6 oz (108.682 kg)  08/27/13 236 lb (107.049 kg)     ASSESSMENT AND PLAN:  Discussed the following assessment and plan:  Adjustment disorder with anxious mood - doing great on celexcan handle stress better   no more cp    Chronic recurrent sinusitis - try nasacort if helps and not burning will rx  also anthistamine  before   home visits   Medication management  Weight gain - doing much better strategies for dc sodas etc meiterranean  LS Ok to continue med and refill  -Patient advised to return or notify health care team  if symptoms worsen ,persist or new concerns arise.  Patient Instructions     Try  Otc nasacort  First  And if  tolerated better can do rx for that instead of the flonase with burns the nose.   consider taking  Antihistamine before  Home visits to avoid exposure..  Separate the sugar and caffiene .  To get rid of the sugar drinks and sodas.      Why follow it? Research shows. . Those who follow the Mediterranean diet have a reduced risk of heart disease  . The diet is associated with a reduced incidence of Parkinson's and Alzheimer's diseases . People following the diet may have longer life expectancies and lower rates of chronic diseases  . The Dietary Guidelines for Americans recommends the Mediterranean diet as an eating plan to promote health and prevent disease  What Is the Mediterranean Diet?  . Healthy eating plan based on typical foods and recipes of Mediterranean-style cooking . The diet is primarily a plant based diet; these foods should make up a majority of meals   Starches - Plant based foods should make up a majority of meals - They are an important sources of vitamins, minerals, energy, antioxidants, and fiber - Choose whole grains, foods high in fiber and minimally processed items  - Typical grain sources include wheat, oats, barley, corn, brown rice, bulgar, farro, millet, polenta, couscous  - Various types of beans include chickpeas, lentils, fava beans, black beans, white beans   Fruits  Veggies - Large quantities of antioxidant rich fruits & veggies; 6 or more servings  - Vegetables can be eaten raw or lightly drizzled with oil and cooked  - Vegetables common to the traditional Mediterranean Diet include: artichokes, arugula, beets, broccoli, brussel sprouts, cabbage, carrots, celery, collard greens, cucumbers, eggplant, kale, leeks, lemons, lettuce, mushrooms, okra, onions, peas, peppers, potatoes, pumpkin, radishes, rutabaga, shallots, spinach, sweet potatoes, turnips, zucchini - Fruits common to the Mediterranean Diet include: apples,  apricots, avocados, cherries, clementines, dates, figs, grapefruits, grapes, melons, nectarines, oranges, peaches, pears, pomegranates, strawberries, tangerines  Fats - Replace butter and margarine with healthy oils, such as olive oil, canola oil, and tahini  - Limit nuts to no more than a handful a day  - Nuts include walnuts, almonds, pecans, pistachios, pine nuts  - Limit or avoid candied, honey roasted or heavily salted nuts - Olives are central to the [employer] - can be eaten whole or used in a variety of dishes   Meats Protein - Limiting red meat: no more than a few times a month - When eating red meat: choose lean cuts and keep the portion to the size of deck of cards - Eggs: approx. 0 to 4 times a week  - Fish and lean poultry: at least 2 a week  - Healthy protein sources include, chicken, [place], lean beef, lamb - Increase intake of seafood such as tuna, salmon, trout, mackerel, shrimp, scallops - Avoid or limit high fat processed meats such as sausage and bacon  Dairy - Include moderate amounts of low fat dairy products  - Focus on healthy dairy such as fat free yogurt, skim milk, low or reduced fat cheese - Limit dairy products higher in fat such as whole or 2% milk, cheese, ice cream  Alcohol - Moderate amounts of red wine is ok  - No more than 5 oz daily for women (all ages) and men older than age 86  - No more than 10 oz of wine daily for men younger than 49  Other - Limit sweets and other desserts  - Use herbs and spices instead of salt to  flavor foods  - Herbs and spices common to the traditional Mediterranean Diet include: basil, bay leaves, chives, cloves, cumin, fennel, garlic, lavender, marjoram, mint, oregano, parsley, pepper, rosemary, sage, savory, sumac, tarragon, thyme   It's not just a diet, it's a lifestyle:  . The Mediterranean diet includes lifestyle factors typical of those in the region  . Foods, drinks and meals are best eaten with others and  savored . Daily physical activity is important for overall good health . This could be strenuous exercise like running and aerobics . This could also be more leisurely activities such as walking, housework, yard-work, or taking the stairs . Moderation is the key; a balanced and healthy diet accommodates most foods and drinks . Consider portion sizes and frequency of consumption of certain foods   Meal Ideas & Options:  . Breakfast:  o Whole wheat toast or whole wheat English muffins with peanut butter & hard boiled egg o Steel cut oats topped with apples & cinnamon and skim milk  o Fresh fruit: banana, strawberries, melon, berries, peaches  o Smoothies: strawberries, bananas, greek yogurt, peanut butter o Low fat greek yogurt with blueberries and granola  o Egg white omelet with spinach and mushrooms o Breakfast couscous: whole wheat couscous, apricots, skim milk, cranberries  . Sandwiches:  o Hummus and grilled vegetables (peppers, zucchini, squash) on whole wheat bread   o Grilled chicken on whole wheat pita with lettuce, tomatoes, cucumbers or tzatziki  o Tuna salad on whole wheat bread: tuna salad made with greek yogurt, olives, red peppers, capers, green onions o Garlic rosemary lamb pita: lamb sauted with garlic, rosemary, salt & pepper; add lettuce, cucumber, greek yogurt to pita - flavor with lemon juice and black pepper  . Seafood:  o Mediterranean grilled salmon, seasoned with garlic, basil, parsley, lemon juice and black pepper o Shrimp, lemon, and spinach whole-grain pasta salad made with low fat greek yogurt  o Seared scallops with lemon orzo  o Seared tuna steaks seasoned salt, pepper, coriander topped with tomato mixture of olives, tomatoes, olive oil, minced garlic, parsley, green onions and cappers  . Meats:  o Herbed greek chicken salad with kalamata olives, cucumber, feta  o Red bell peppers stuffed with spinach, bulgur, lean ground beef (or lentils) & topped with feta    o Kebabs: skewers of chicken, tomatoes, onions, zucchini, squash  o [place] burgers: made with red onions, mint, dill, lemon juice, feta cheese topped with roasted red peppers . Vegetarian o Cucumber salad: cucumbers, artichoke hearts, celery, red onion, feta cheese, tossed in olive oil & lemon juice  o Hummus and whole grain pita points with a greek salad (lettuce, tomato, feta, olives, cucumbers, red onion) o Lentil soup with celery, carrots made with vegetable broth, garlic, salt and pepper  o Tabouli salad: parsley, bulgur, mint, scallions, cucumbers, tomato, radishes, lemon juice, olive oil, salt and pepper.            Standley Brooking. Tieler Cournoyer M.D.  Pre visit review using our clinic review tool, if applicable. No additional management support is needed unless otherwise documented below in the visit note.\ Total visit 39mins > 50% spent counseling and coordinating care

## 2014-01-21 ENCOUNTER — Telehealth: Payer: Self-pay | Admitting: Obstetrics & Gynecology

## 2014-01-21 NOTE — Telephone Encounter (Signed)
See note where documentation was made

## 2014-01-21 NOTE — Telephone Encounter (Signed)
43 yo G7P2 called stating she thinks she has an ovarian cyst that ruptured and is in a lot of pelvic pain.  No fever.  No nausea.  H/O ovarian torsion s/p LSO 2010 so reports this pain is "nothing like that" but still bad enough to call.  She has taken motrin twice and wants to know what else she can do.  Heat and alternating tylenol/motrin discussed as well as local ER or urgent care if pain requires narcotics for treatment.  Pt aware if starts to have nausea or emesis, needs to be seen as well.  Reports she is not having these issues, just pain.  Pt in [place], [place], for the holiday and isn't planning on being back in [place] until at least tomorrow.  Advised is wants appointment tomorrow, to call and we will work her into the schedule.  Pt voiced clear understanding of above.

## 2014-01-24 ENCOUNTER — Telehealth: Payer: Self-pay | Admitting: Gynecology

## 2014-01-24 ENCOUNTER — Telehealth: Payer: Self-pay | Admitting: Internal Medicine

## 2014-01-24 NOTE — Telephone Encounter (Signed)
PLEASE NOTE: All timestamps contained within this report are represented as [place] Standard Time. CONFIDENTIALTY NOTICE: This fax transmission is intended only for the addressee. It contains information that is legally privileged, confidential or otherwise protected from use or disclosure. If you are not the intended recipient, you are strictly prohibited from reviewing, disclosing, copying using or disseminating any of this information or taking any action in reliance on or regarding this information. If you have received this fax in error, please notify us immediately by telephone so that we can arrange for its return to [place]. Phone: [phone], Toll-Free: [phone], Fax: [phone] Page: 1 of 2 Call Id: [id number] [facility] - Client [place] Patient Name: [name] Gender: Female DOB: Aug 22, 1970 Age: 43 Y 10 M 7 D Return Phone Number: [phone] (Primary) Address: City/State/Zip: [place] Client [facility] - Client Client Site [place] - Day Physician [name] Contact Type Call Call Type Triage / Clinical Relationship To Patient Son Return Phone Number [phone] (Primary) Chief Complaint ABDOMINAL PAIN - Severe and only in abdomen Initial Comment Caller states she is feeling pain in her right abdominal side. Only in ab area PreDisposition Did not know what to do Nurse Assessment Nurse: Renee Specter, RN, [name] Date/Time [name] Time): 01/24/2014 1:18:32 PM Confirm and document reason for call. If symptomatic, describe symptoms. ---Caller states she is feeling pain in her right abdominal side. Went to ED monday and no diagnosis. Is lower right quadrant. Has the patient traveled out of the country within the last 30 days? ---Not Applicable Does the patient require triage? ---Yes Related visit to physician within the last 2 weeks? ---No Does the PT  have any chronic conditions? (i.e. diabetes, asthma, etc.) ---No Did the patient indicate they were pregnant? ---No Guidelines Guideline Title Affirmed Question Affirmed Notes Nurse Date/Time [name] Time) Abdominal Pain - Female [1] SEVERE pain (e.g., excruciating) AND [2] present > 1 hour Renee Specter, RN, [name] 01/24/2014 1:19:27 PM Disp. Time [name] Time) Disposition Final User 01/24/2014 1:17:17 PM Send to Urgent Queue [name] 01/24/2014 1:21:26 PM Go to ED Now Yes Renee Specter, RN, [name] Understands: Yes Disagree/Comply: Disagree Disagree/Comply Reason: Disagree with instructions PLEASE NOTE: All timestamps contained within this report are represented as [place] Standard Time. CONFIDENTIALTY NOTICE: This fax transmission is intended only for the addressee. It contains information that is legally privileged, confidential or otherwise protected from use or disclosure. If you are not the intended recipient, you are strictly prohibited from reviewing, disclosing, copying using or disseminating any of this information or taking any action in reliance on or regarding this information. If you have received this fax in error, please notify us immediately by telephone so that we can arrange for its return to [place]. Phone: [phone], Toll-Free: [phone], Fax: [phone] Page: 2 of 2 Call Id: [id number] Care Advice Given Per Guideline GO TO ED NOW: You need to be seen in the Emergency Department. Go to the ER at ___________ [place] now. Drive carefully. CARE ADVICE given per Abdominal Pain, Female (Adult) guideline. After Care Instructions Given Call Event Type User Date / Time Description

## 2014-01-24 NOTE — Telephone Encounter (Signed)
On-call note: Patient calls complaining of persistent right sided pain. Called Dr. Sabra Heck earlier this week with acute onset of pain and ultimately she went to the emergency room as she is out of town. Reports having a CT scan and was told that she did not have an appendicitis. They did not see anything like ovarian cysts. She was given pain medication and sent home. Notes that the pain overall is better but still nagging coming and going. Does note that she has not had a bowel movement in several days and this is not unusual as she is being followed for IBS. No fever or chills, is eating regularly without nausea or vomiting. No dysuria, frequency or urgency.  Is having regular menses and status post tubal sterilization. History of torsion with left oophorectomy in the past. This pain is nothing like the pain she was having before. Reviewed options to include reevaluation in the emergency room as she still is out of town versus waiting and limiting activities over this weekend noting that she is feeling better overall from the acute onset and calling to see Dr. Quincy Simmonds Monday. The patient prefers to wait and she will call Dr. Elza Rafter office Monday to be seen. I reviewed with her particular with a history of IBS that this may not be GYN related at all. Particularly since she has not had a bowel movement in days. I did recommend that she become more aggressive as far as trying to stimulate a bowel movement with Miralax or fleets enema and see if this doesn't help also with her discomfort.  ASAP call/ER evaluation precautions reviewed.

## 2014-01-28 ENCOUNTER — Telehealth: Payer: Self-pay | Admitting: Internal Medicine

## 2014-01-28 ENCOUNTER — Encounter: Payer: Self-pay | Admitting: Internal Medicine

## 2014-01-28 NOTE — Telephone Encounter (Signed)
Spoke to the pt.  She has pain under her rt breast and under her belly button on the rt side ( 2 areas).  Was seen in an ED out of town last Monday.  She has blood work and CT scan results.  Will fax them on 01/29/14.  Pain has gotten better but still present.  Had a fever in the beginning and a rash on the roof of her mouth.  No longer has these sx.  Denied current fever, nausea, vomiting and/or diarrhea.  Needs to know if there is any further advise.  Pt has follow up visit scheduled.  She pt advise request.

## 2014-01-28 NOTE — Telephone Encounter (Signed)
[facility] - Client [place] [facility] Patient Name: [name] [facility] Gender: Female DOB: 17-May-1970 Age: 44 Y 10 M 7 D Return Phone Number: [phone] (Primary) Address: City/State/Zip: [place] Client [facility] - Client Client Site [place] - Day Physician [name] Contact Type Call Call Type Triage / Clinical Relationship To Patient Son Return Phone Number [phone] (Primary) Chief Complaint ABDOMINAL PAIN - Severe and only in abdomen Initial Comment Caller states she is feeling pain in her right abdominal side. Only in ab area PreDisposition Did not know what to do Nurse Assessment Nurse: [name], RN, Wells Guiles Date/Time [name] Time): 01/24/2014 1:18:32 PM Confirm and document reason for call. If symptomatic, describe symptoms. ---Caller states she is feeling pain in her right abdominal side. Went to ED monday and no diagnosis. Is lower right quadrant. Has the patient traveled out of the country within the last 30 days? ---Not Applicable Does the patient require triage? ---Yes Related visit to physician within the last 2 weeks? ---No Does the PT have any chronic conditions? (i.e. diabetes, asthma, etc.) ---No Did the patient indicate they were pregnant? ---No Guidelines Guideline Title Affirmed Question Affirmed Notes Nurse Date/Time [name] Time) Abdominal Pain - Female [1] SEVERE pain (e.g., excruciating) AND [2] present > 1 hour [name], RN, Wells Guiles 01/24/2014 1:19:27 PM Disp. Time [name] Time) Disposition Final User 01/24/2014 1:17:17 PM Send to Urgent Queue Bobby Rumpf 01/24/2014 1:21:26 PM Go to ED Now Yes [name], RN, Romualdo Bolk Understands: Yes Disagree/Comply: Disagree Disagree/Comply Reason: Disagree with instructions PLEASE NOTE: All timestamps contained within this report are represented as [place] Standard Time. CONFIDENTIALTY  NOTICE: This fax transmission is intended only for the addressee. It contains information that is legally privileged, confidential or otherwise protected from use or disclosure. If you are not the intended recipient, you are strictly prohibited from reviewing, disclosing, copying using or disseminating any of this information or taking any action in reliance on or regarding this information. If you have received this fax in error, please notify us immediately by telephone so that we can arrange for its return to [place]. Phone: [phone], Toll-Free: [phone], Fax: [phone] Page: 2 of 2 Call Id: [id number] Care Advice Given Per Guideline GO TO ED NOW: You need to be seen in the Emergency Department. Go to the ER at ___________ [place] now. Drive carefully. CARE ADVICE given per Abdominal Pain, Female (Adult) guideline. After Care Instructions Given Call Event Type User Date / Time Description

## 2014-02-04 ENCOUNTER — Ambulatory Visit (INDEPENDENT_AMBULATORY_CARE_PROVIDER_SITE_OTHER): Payer: No Typology Code available for payment source | Admitting: Internal Medicine

## 2014-02-04 ENCOUNTER — Encounter: Payer: Self-pay | Admitting: Internal Medicine

## 2014-02-04 VITALS — BP 104/78 | HR 86 | Temp 98.1°F | Wt 242.2 lb

## 2014-02-04 DIAGNOSIS — Z79899 Other long term (current) drug therapy: Secondary | ICD-10-CM | POA: Insufficient documentation

## 2014-02-04 DIAGNOSIS — R1031 Right lower quadrant pain: Secondary | ICD-10-CM | POA: Insufficient documentation

## 2014-02-04 DIAGNOSIS — Z8742 Personal history of other diseases of the female genital tract: Secondary | ICD-10-CM | POA: Insufficient documentation

## 2014-02-04 DIAGNOSIS — Z8669 Personal history of other diseases of the nervous system and sense organs: Secondary | ICD-10-CM | POA: Insufficient documentation

## 2014-02-04 NOTE — Patient Instructions (Signed)
Concern could be  Endometriosis or  Gyne pain but would  Get  abd [place] to check the liver gall bladder area .  fmla  For migraine and this illness.    Plan follow up after gyne eval and [place].   Limit  Narcotic use . To avoid  Constipation.    Get appt with dr Quincy Simmonds to evaluate.

## 2014-02-04 NOTE — Progress Notes (Signed)
Chief Complaint  Patient presents with  . Hospitalization Follow-up    [name]  hospital   ED    HPI: Patient  [name]  44 y.o. comes in today for "Up ED" no records  To review pre visit pt said she faxed them but cant be found for visit . When visitn family in [place] abd right lower mid pain  [place] during holidays      right lq and wnt to ed  Out of town  Getting worse and went to  Morphine  Cat and  Blood test   Also contact her [place]  office  Was out of town  Dec 31 called gyne call and didn't feel friom a cyst but to call for appt   hasnt done that  Thought he told her to check with pcp  First      Began low  abd and then up no radiation to shoulder .  Urine clear   .... Lipase and albumin elevated  No vomiting   In ed within 48 hours   Very bad  .  8 /10 now is   Dull ache 6-7 . CAT  Scan UA neg neg  Given hydrocodone  Taking at times  She states this feels like ovary cyst. Had hx of torsion in past .   Hx of lap dx   Using ibuprofen also  ? Hx of endometriosis    No iud for 3 years.  Hx ibs but this is different.  Also asks for fmla form in case  For migraines she gets 3-4 x per year at most x 3- days ior so and the abd pain episode      Health Maintenance  Topic Date Due  . INFLUENZA VACCINE  08/25/2013  . MAMMOGRAM  12/04/2014  . TETANUS/TDAP  01/26/2015  . PAP SMEAR  09/10/2016   ROS:  GEN/ HEENT: No fever, significant weight changes sweats headaches vision problems hearing changes, CV/ PULM; No chest pain shortness of breath cough, syncope,edema  change in exercise tolerance. GI /GU: No , change in bowel habits. No blood in the stool. No significant GU symptoms. SKIN/HEME: ,no acute skin rashes suspicious lesions or bleeding. No lymphadenopathy, nodules, masses.  NEURO/ PSYCH:  No neurologic signs such as weakness numbness. No depression anxiety. IMM/ Allergy: No unusual infections.  Allergy .   REST of 12 system review negative  except as per HPI   Past Medical History  Diagnosis Date  . Anxiety   . Chest pain     neg stress test summer 2010  . Migraines   . Endometriosis   . Sinusitis   . Ovarian torsion   . Allergic rhinitis     hx of testing and allergic to spring and fall f= pollens  . Anemia     nos  . GERD (gastroesophageal reflux disease)   . Endometriosis   . Hyperlipidemia   . Headache(784.0)   . Labial cyst 04/08/2011    early infection use hotpcompresses    . Thyroid disease     goiter  . STD (sexually transmitted disease)     Pos. Chlamydia--in her 20's    Past Surgical History  Procedure Laterality Date  . Abdominal surgery    . Oophrectomy    . Tubal ligation    . Unilateral salpingectomy      right  age 5 ectopics.  . Cesarean section  2002, 2004    x2  .  Pelvic laparoscopy      x7    Family History  Problem Relation Age of Onset  . Anxiety disorder Mother     mother is on xanax and apparently applying for  social security diabilty for her anxiety. t  . Depression Mother   . Diabetes Mother   . Hypertension Mother   . Coronary artery disease    . Heart attack Father   . Seizures Father   . Diabetes    . Hyperlipidemia    . Other      history of child death in family   . Stroke Maternal Grandmother   . Diabetes Paternal Grandmother   . Hypertension Paternal Grandmother     History   Social History  . Marital Status: Single    Spouse Name: N/A    Number of Children: N/A  . Years of Education: N/A   Social History Main Topics  . Smoking status: Never Smoker   . Smokeless tobacco: None  . Alcohol Use: 0.5 oz/week    1 drink(s) per week     Comment: occ  . Drug Use: No  . Sexual Activity:    Partners: Male    Birth Control/ Protection: Surgical     Comment: Tubal   Other Topics Concern  . 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   And graduated as SW.      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 Aspirin-Caffeine (BC FAST PAIN RELIEF) 845-65 MG PACK Take 65 mg by mouth every 8 (eight) hours as needed. Headache  . citalopram (CELEXA) 20 MG tablet Take 1 tablet (20 mg total) by mouth daily.  . fluticasone (FLONASE) 50 MCG/ACT nasal spray Place 2 sprays into both nostrils daily.  Marland Kitchen ibuprofen (ADVIL,MOTRIN) 200 MG tablet Take 1,000 mg by mouth every 6 (six) hours as needed for moderate pain (headache).   . loratadine (CLARITIN) 10 MG tablet Take 10 mg by mouth daily.  . Multiple Vitamins-Minerals (MULTIVITAMIN PO) Take 1 tablet by mouth daily.   Marland Kitchen triamterene-hydrochlorothiazide (MAXZIDE-25) 37.5-25 MG per tablet TAKE 1 TABLET BY MOUTH EVERY DAY    EXAM:  BP 104/78 mmHg  Pulse 86  Temp(Src) 98.1 F (36.7 C) (Oral)  Wt 242 lb 3.2 oz (109.861 kg)  Body mass index is 40.01 kg/(m^2).  Physical Exam: Vital signs reviewed VOZ:DGUY is a well-developed well-nourished alert cooperative    who appearsr stated age in no acute distress.  HEENT: normocephalic atraumatic , Eyes: PERRL EOM's full, conjunctiva clear, Nares: paten,t no deformity discharge or tenderness., Ears: no deformity Mouth: clear OP, no lesions, edema.  Moist mucous membranes. Dentition in adequate repair. NECK: supple without masses,or bruits. CHEST/PULM:  Clear to auscultation and percussion breath sounds equal no wheeze , rales or rhonchi. CV: PMI is nondisplaced, S1 S2 no gallops, murmurs,  rubs. Peripheral pulses are full without delay.No JVD .  ABDOMEN: Bowel sounds normal  nontender  No guard or rebound, no hepato splenomegal no CVA tenderness.  No hernia. Discomfort over mid right area  No g or r and no mass or rebound  Extremtities:  No clubbing cyanosis or edema, no acute joint swelling or redness no focal atrophy NEURO:  Oriented x3, cranial nerves 3-12 appear to be intact, no obvious focal weakness,gait within normal SKIN: No acute rashes normal turgor, color, no bruising or petechiae. PSYCH: Oriented, good eye contact, no obvious depression anxiety, cognition and judgment appear normal. LN: no cervical  inguinal adenopathy  Lab Results  Component Value Date   WBC 10.1 08/27/2013   HGB 12.8 08/27/2013   HCT 37.9 08/27/2013   PLT 427* 08/27/2013   GLUCOSE 93 08/27/2013   CHOL 209* 04/08/2011   TRIG 192.0* 04/08/2011   HDL 45.80 04/08/2011   LDLDIRECT 129.7 04/08/2011   LDLCALC 147* 08/24/2006   ALT 16 08/21/2013   AST 18 08/21/2013   NA 139 08/27/2013   K 4.0 08/27/2013   CL 103 08/27/2013   CREATININE 0.86 08/27/2013   BUN 11 08/27/2013   CO2 21 08/27/2013   TSH 1.18 02/07/2013   HGBA1C 5.8 08/24/2006   Wt Readings from Last 3 Encounters:  02/04/14 242 lb 3.2 oz (109.861 kg)  01/17/14 237 lb 12.8 oz (107.865 kg)  09/10/13 239 lb 9.6 oz (108.682 kg)   BP Readings from Last 3 Encounters:  02/04/14 104/78  01/17/14 120/76  09/10/13 118/64     ASSESSMENT AND PLAN:  Discussed the following assessment and plan:  Right lower quadrant abdominal pain mid also - uncertain cause check [place] for GB pancfease area  doesnt seem like IBS ? ruptured cyst ovulation endometriosis? see gyne fax [place] the records again., - Plan: US Abdomen Complete  Hx of endometriosis - Plan: US Abdomen Complete  Hx of migraine headaches - ask for fmla about 4 x per year if needed Reported mild  elevaetion lipase    .  Urine clear   Plan abd [place] . Need gyne appt  To review.  Sometime trans xs vag  pelvic US is more accurate  fmal form to do was out Dec 28th through  Jan  4th  Was out of town during episode and couldn't drive back .  Patient Care Team: Burnis Medin, MD as PCP - General Fay Records, MD (Cardiology) Renato Shin, MD as Consulting Physician (Endocrinology) Jamey Reas de Berton Lan, MD as Consulting Physician (Obstetrics and Gynecology) Patient Instructions  Concern could be  Endometriosis or  Gyne pain but would  Get  abd [place] to check the liver gall bladder area .  fmla  For migraine and this illness.    Plan follow up after gyne eval and [place].   Limit  Narcotic use . To avoid  Constipation.    Get appt with dr Quincy Simmonds to evaluate.    Standley Brooking. Okley Magnussen M.D.   Pre visit review using our clinic review tool, if applicable. No additional management support is needed unless otherwise documented below in the visit note.

## 2014-02-11 ENCOUNTER — Ambulatory Visit
Admission: RE | Admit: 2014-02-11 | Discharge: 2014-02-11 | Disposition: A | Discharge status: Home / Self Care | Payer: No Typology Code available for payment source | Source: Ambulatory Visit | Attending: Internal Medicine | Admitting: Internal Medicine

## 2014-02-11 DIAGNOSIS — R1031 Right lower quadrant pain: Secondary | ICD-10-CM

## 2014-02-11 DIAGNOSIS — Z8742 Personal history of other diseases of the female genital tract: Secondary | ICD-10-CM

## 2014-02-13 ENCOUNTER — Telehealth: Payer: Self-pay | Admitting: Obstetrics and Gynecology

## 2014-02-13 NOTE — Telephone Encounter (Signed)
Spoke with patient. Patient states that she began to have right lower quadrant pain on 01/19/14. Pain subsided but then returned on 12/28. Patient was seen at ER out of town and CT scan was performed. "They told me that everything was normal. They gave me pain meds and I was on my way." Patient had follow up with Dr.Panosh 02/04/14 and had abdominal ultrasound on 02/11/14. Patient states that Dr.Panosh would like her to have follow up with Dr.Silva as she feels this may be related to endometriosis. Patient states that she is still "tender" in right lower quadrant. Patient has been having nausea, vomiting, and diarrhea after eating. "She wanted to check my gallbladder with the ultrasound and then see Dr.Silva as well to make sure it was not GYN related." Advised Dr.Silva will be out of town until Feb 1. Patient requesting appointment before then. Offered appointment for 1/22 but patient declines due to weather.Appointment scheduled for 02/19/14 at 2pm with [facility]. Patient will bring all notes and results from ER visit to appointment.Agreeable to date and time.  Cc: Dr.Miller  Routing to provider for final review. Patient agreeable to disposition. Will close encounter

## 2014-02-13 NOTE — Telephone Encounter (Signed)
Patient calling re: needing "a post emergency room visit for right side pain when I was out of town." She had an ultrasound yesterday.

## 2014-02-13 NOTE — Telephone Encounter (Signed)
Thank you for getting the patient back to the office while I out.  I would like to see the patient in follow up following that visit.  She is a long standing patient of mine.   Cc- Dr. Sabra Heck

## 2014-02-14 ENCOUNTER — Encounter: Payer: Self-pay | Admitting: Internal Medicine

## 2014-02-14 ENCOUNTER — Other Ambulatory Visit: Payer: Self-pay | Admitting: Family Medicine

## 2014-02-14 DIAGNOSIS — R1031 Right lower quadrant pain: Secondary | ICD-10-CM

## 2014-02-14 NOTE — Telephone Encounter (Signed)
The abd US showed no gall stones   Some changes of fatty liver but  This should not be causing sx . We  proceed with seeing  Her GYNE  We can can also get GI to see her   May we do a referral ?

## 2014-02-15 NOTE — Progress Notes (Signed)
FMLA from  Has been complete for her abd pain absence and  Episodic migraines about 4 per year . Document  Copy should be send to scan.

## 2014-02-19 ENCOUNTER — Encounter: Payer: Self-pay | Admitting: Obstetrics & Gynecology

## 2014-02-19 ENCOUNTER — Ambulatory Visit (INDEPENDENT_AMBULATORY_CARE_PROVIDER_SITE_OTHER): Payer: No Typology Code available for payment source | Admitting: Obstetrics & Gynecology

## 2014-02-19 VITALS — BP 120/70 | HR 84 | Temp 98.1°F | Ht 65.25 in | Wt 244.0 lb

## 2014-02-19 DIAGNOSIS — R319 Hematuria, unspecified: Secondary | ICD-10-CM

## 2014-02-19 DIAGNOSIS — R102 Pelvic and perineal pain: Secondary | ICD-10-CM

## 2014-02-19 LAB — POCT URINALYSIS DIPSTICK
BILIRUBIN UA: NEGATIVE
Glucose, UA: NEGATIVE
Ketones, UA: NEGATIVE
NITRITE UA: NEGATIVE
PROTEIN UA: NEGATIVE
Urobilinogen, UA: NEGATIVE
pH, UA: 5

## 2014-02-19 NOTE — Progress Notes (Signed)
Pelvic ultrasound scheduled for 02-21-14 at 3pm here in office. Patient agreeable to date and time.

## 2014-02-19 NOTE — Progress Notes (Signed)
Subjective:     Patient ID: [name], female   DOB: 05/05/1970, 44 y.o.   MRN: [id number]  HPI 33 G7P2 S AA F here for discussion of RLQ and RUQ pain that occurred over the holidays. This was around new year's.  I actually remember speaking to pt on phone when she called for recommendations.  At that time she was in so much pain she did not feel that she could drive back to [place] safely.  Local ER was recommended.  Pt did have CT scan and labs but did not bring those with her today.  She did eventually make it back to [place].  Pt reports she wonders if this is/was an ovarian cyst as she only has one ovary and did have pain that was intense when she had an ovarian torsion on the left side.  Had LSO at that time.  Pt reports pain is better now and is maybe 5/10 pain scale.  Denies nausea, vomiting.  No urinary symptoms.  No fever.  No back pain.  Pt also wonders about endometriosis but she has never been diagnosed with this.  Pt not sexually active.  Using no birth control.  Did have an IUD but had it removed 2-3 years ago.    Also has a hx of IBS but she has never had pain like this before.   Saw Dr. Regis Bill on 02/04/14.  In her note, it states lipase and albumin levels were elevated in ER.  Exam was normal.  Abd u/s was ordered.  Only significant finding was "Liver echogenicity is somewhat increased and inhomogeneous. This appearance most likely is due to hepatic steatosis."  She was encouraged to see [place] as well.  Review of Systems  All other systems reviewed and are negative.      Objective:   Physical Exam  Constitutional: She appears well-developed and well-nourished.  Neck: No thyromegaly present.  Cardiovascular: Normal rate and regular rhythm.   Pulmonary/Chest: Effort normal and breath sounds normal.  Abdominal: Soft. Bowel sounds are normal. She exhibits no distension and no mass. There is tenderness (mild RLQ). There is no rebound and no guarding.  Genitourinary: Vagina normal.  There is no rash, tenderness, lesion or injury on the right labia. There is no rash, tenderness, lesion or injury on the left labia. Uterus is enlarged (and feels full). Uterus is not deviated and not fixed. Cervix exhibits no discharge. Right adnexum displays no mass and no tenderness. Left adnexum displays no mass. No tenderness or bleeding in the vagina. No foreign body around the vagina. No vaginal discharge found.  Lymphadenopathy:       Right: No inguinal adenopathy present.       Left: No inguinal adenopathy present.       Assessment:     RLQ and RUQ pain episode microscopic hematuria noted on dip u/a today H/o laparoscopic LSO due to torsion Mildly enlarged uterus     Plan:     Urine micro and culture pending Return for PUS Pt also having Gastroenterology evaluation this week

## 2014-02-20 ENCOUNTER — Encounter: Payer: Self-pay | Admitting: Gastroenterology

## 2014-02-20 ENCOUNTER — Ambulatory Visit (INDEPENDENT_AMBULATORY_CARE_PROVIDER_SITE_OTHER): Payer: No Typology Code available for payment source | Admitting: Gastroenterology

## 2014-02-20 VITALS — BP 110/70 | HR 93 | Ht 66.0 in | Wt 243.6 lb

## 2014-02-20 DIAGNOSIS — R197 Diarrhea, unspecified: Secondary | ICD-10-CM

## 2014-02-20 DIAGNOSIS — K219 Gastro-esophageal reflux disease without esophagitis: Secondary | ICD-10-CM

## 2014-02-20 DIAGNOSIS — R1011 Right upper quadrant pain: Secondary | ICD-10-CM

## 2014-02-20 LAB — URINALYSIS, MICROSCOPIC ONLY
Bacteria, UA: NONE SEEN
CASTS: NONE SEEN
SQUAMOUS EPITHELIAL / LPF: NONE SEEN

## 2014-02-20 LAB — URINE CULTURE

## 2014-02-20 MED ORDER — PANTOPRAZOLE SODIUM 40 MG PO TBEC: 40.0000 mg | DELAYED_RELEASE_TABLET | Freq: Every day | ORAL | Status: DC

## 2014-02-20 NOTE — Patient Instructions (Signed)
You have been scheduled for a HIDA scan at [facility] on 02/22/14. Please arrive 15 minutes prior to your scheduled appointment at  7 am. Make certain not to have anything to eat or drink at least 6 hours prior to your test. Should this appointment date or time not work well for you, please call radiology scheduling at [phone].  _____________________________________________________________________ hepatobiliary (HIDA) scan is an imaging procedure used to diagnose problems in the liver, gallbladder and bile ducts. In the HIDA scan, a radioactive chemical or tracer is injected into a vein in your arm. The tracer is handled by the liver like bile. Bile is a fluid produced and excreted by your liver that helps your digestive system break down fats in the foods you eat. Bile is stored in your gallbladder and the gallbladder releases the bile when you eat a meal. A special nuclear medicine scanner (gamma camera) tracks the flow of the tracer from your liver into your gallbladder and small intestine.  During your HIDA scan  You'll be asked to change into a hospital gown before your HIDA scan begins. Your health care team will position you on a table, usually on your back. The radioactive tracer is then injected into a vein in your arm.The tracer travels through your bloodstream to your liver, where it's taken up by the bile-producing cells. The radioactive tracer travels with the bile from your liver into your gallbladder and through your bile ducts to your small intestine.You may feel some pressure while the radioactive tracer is injected into your vein. As you lie on the table, a special gamma camera is positioned over your abdomen taking pictures of the tracer as it moves through your body. The gamma camera takes pictures continually for about an hour. You'll need to keep still during the HIDA scan. This can become uncomfortable, but you may find that you can lessen the discomfort by taking deep breaths and  thinking about other things. Tell your health care team if you're uncomfortable. The radiologist will watch on a computer the progress of the radioactive tracer through your body. The HIDA scan may be stopped when the radioactive tracer is seen in the gallbladder and enters your small intestine. This typically takes about an hour. In some cases extra imaging will be performed if original images aren't satisfactory, if morphine is given to help visualize the gallbladder or if the medication CCK is given to look at the contraction of the gallbladder. This test typically takes 2 hours to complete. ________________________________________________________________________ Your physician has requested that you go to the basement for lab work before leaving today. We have sent medications to your pharmacy for you to pick up at your convenience.

## 2014-02-21 ENCOUNTER — Ambulatory Visit (INDEPENDENT_AMBULATORY_CARE_PROVIDER_SITE_OTHER): Payer: No Typology Code available for payment source

## 2014-02-21 ENCOUNTER — Ambulatory Visit (INDEPENDENT_AMBULATORY_CARE_PROVIDER_SITE_OTHER): Payer: No Typology Code available for payment source | Admitting: Obstetrics & Gynecology

## 2014-02-21 VITALS — BP 122/78 | Ht 66.0 in | Wt 243.0 lb

## 2014-02-21 DIAGNOSIS — R1011 Right upper quadrant pain: Secondary | ICD-10-CM

## 2014-02-21 DIAGNOSIS — D251 Intramural leiomyoma of uterus: Secondary | ICD-10-CM

## 2014-02-21 DIAGNOSIS — R8299 Other abnormal findings in urine: Secondary | ICD-10-CM

## 2014-02-21 DIAGNOSIS — N921 Excessive and frequent menstruation with irregular cycle: Secondary | ICD-10-CM

## 2014-02-21 DIAGNOSIS — N946 Dysmenorrhea, unspecified: Secondary | ICD-10-CM

## 2014-02-21 DIAGNOSIS — R1031 Right lower quadrant pain: Secondary | ICD-10-CM

## 2014-02-21 DIAGNOSIS — R899 Unspecified abnormal finding in specimens from other organs, systems and tissues: Secondary | ICD-10-CM

## 2014-02-21 DIAGNOSIS — R82998 Other abnormal findings in urine: Secondary | ICD-10-CM

## 2014-02-21 NOTE — Progress Notes (Signed)
44 y.o. G15P2 SAAF Singlefemale here for a pelvic ultrasound due to hx of ovarian cysts as well as RLQ and RUQ pain that occurred over the holidays.  Pt wes seen 02/19/14 and u/a showed RBCs as well as calcium oxalate stones.  This may well end up being an episode of nephrolithiasis.  Pt does have HIDA scan planned for tomorrow.  Depending on results, we discussed possible urology follow-up.  For now, we did discuss increased fluids, decreased caffein and carbonation with oral intake.  Pt reports pain is about the same but very manageable.   Reviewed CT scan report and lap reports from [place] [facility] with pt.  Essentially CT read as normal.    Patient's last menstrual period was 01/31/2014 (approximate).  Sexually active:  no  Contraception: no method  FINDINGS: UTERUS:  1.8 x 7 x 7cm with at least three intramural and subserosal fibroids measuring 4.4, 2.0, and 2.4cm EMS: 11.80mm, echogenic ADNEXA:   Left ovary surgically absent   Right ovary 3.3 x 2.6 x 1.7cm CUL DE SAC: no free fluid noted   Findings reviewed with pt.  Findings of fibroids noted but not on CT done just a few weeks ago.  Pt doesn't have hx of fibroids so feel this should be monitored.  She is in agreement with plan.  May adjust this follow-up depending on HIDA scan results from tomorrow so will wait on this first.  Pt in agreement with plan.  Assessment:  Uterine fibroids, RLQ and RUQ pain, surgically absent left ovary, microscopic hematuria with calcium oxalate crystals in urine  Plan: HIDA scan planned for tomorrow.  If neg, refer to Urology.  Repeat PUS 4-6 months.  ~20 minutes spent with patient >50% of time was in face to face discussion of above.

## 2014-02-22 ENCOUNTER — Encounter (HOSPITAL_COMMUNITY)
Admission: RE | Admit: 2014-02-22 | Discharge: 2014-02-22 | Disposition: A | Discharge status: Home / Self Care | Payer: No Typology Code available for payment source | Source: Ambulatory Visit | Attending: Gastroenterology | Admitting: Gastroenterology

## 2014-02-22 ENCOUNTER — Telehealth: Payer: Self-pay | Admitting: Obstetrics & Gynecology

## 2014-02-22 ENCOUNTER — Encounter: Payer: Self-pay | Admitting: Obstetrics & Gynecology

## 2014-02-22 DIAGNOSIS — D251 Intramural leiomyoma of uterus: Secondary | ICD-10-CM | POA: Insufficient documentation

## 2014-02-22 DIAGNOSIS — R1011 Right upper quadrant pain: Secondary | ICD-10-CM | POA: Insufficient documentation

## 2014-02-22 DIAGNOSIS — K219 Gastro-esophageal reflux disease without esophagitis: Secondary | ICD-10-CM | POA: Diagnosis present

## 2014-02-22 DIAGNOSIS — R197 Diarrhea, unspecified: Secondary | ICD-10-CM | POA: Diagnosis present

## 2014-02-22 DIAGNOSIS — R82998 Other abnormal findings in urine: Secondary | ICD-10-CM | POA: Insufficient documentation

## 2014-02-22 MED ORDER — TECHNETIUM TC 99M MEBROFENIN IV KIT
5.0000 | PACK | Freq: Once | INTRAVENOUS | Status: AC | PRN
  Administered 2014-02-22: 5 via INTRAVENOUS

## 2014-02-22 MED ORDER — SINCALIDE 5 MCG IJ SOLR
INTRAMUSCULAR | Status: AC
  Filled 2014-02-22: qty 10

## 2014-02-22 MED ORDER — SINCALIDE 5 MCG IJ SOLR
0.0200 ug/kg | Freq: Once | INTRAMUSCULAR | Status: AC
  Administered 2014-02-22: 2.17 ug via INTRAVENOUS

## 2014-02-22 NOTE — Telephone Encounter (Signed)
1.Patient calling to report her insurance ends 04/25/14. She says she was told to call with this information to schedule a follow up PUS.  2. Patient wants to know if we are doing a referral to a urologist or if her PCP will need to do that.  3. Patient requests a consult with Dr. Quincy Simmonds to discuss "all the recent changes."

## 2014-02-22 NOTE — Telephone Encounter (Signed)
Dr.Miller, HIDA rseults are available in EPIC at this time. Okay to proceed with referral to urology at this time? Okay to schedule follow up PUS before 04/25/2014 before insurance coverage ends?

## 2014-02-25 ENCOUNTER — Telehealth: Payer: Self-pay | Admitting: Obstetrics & Gynecology

## 2014-02-25 ENCOUNTER — Encounter: Payer: Self-pay | Admitting: Gastroenterology

## 2014-02-25 DIAGNOSIS — R1011 Right upper quadrant pain: Secondary | ICD-10-CM | POA: Insufficient documentation

## 2014-02-25 DIAGNOSIS — R197 Diarrhea, unspecified: Secondary | ICD-10-CM | POA: Insufficient documentation

## 2014-02-25 NOTE — Telephone Encounter (Signed)
Patient called to request scheduling follow up PUS on or before 03.31.2016. Per chart, patient is to have follow PUS 4-6 months from previous (01.28). Patient states that her health plan will change 04.01.2016 and wants to schedule prior to the change. Scheduled patient for 03.31.2016. Advised that message has been sent to Dr Sabra Heck asking if this is okay. If not okay, I will call her back to reschedule the appointment. Patient agreeable.

## 2014-02-25 NOTE — Progress Notes (Addendum)
02/20/2014 Renee BERKEMEIER [id number] 1970-11-18   HISTORY OF PRESENT ILLNESS:  This is a pleasant 44 year old female who is new to our practice.  She presents to our office today with a few different complaints, but primarily describes RUQ abdominal pain that has been occurring/present for several weeks.  Pain seems to worsen with eating.  She is also now having diarrhea recently as well.  Denies seeing blood in her stools.  Also complains of heartburn/reflux; only using pepcid prn for that currently.  She does admit to using BC's a couple of times per week for migraines; she says that she has headaches/migraines almost every day and has seen a neurologist, but nothing else helps her migraines.  Sometimes she uses BC's and Ibuprofen 1000 mg together if the headaches are really bad.  She had an ultrasound of the abdomen performed on 02/11/2014 that showed no gallbladder issues; did show increased and inhomogeneous liver echogenicity, most likely indicated hepatic steatosis.  She remains concerned about her gallbladder.  CBC and CMP were unremarkable on 01/21/2014 when drawn during an ER visit through [facility] system.  Lipase was 51 at that time (considered elevated on their scale).  CT scan of the abdomen and pelvis with contrast was unremarkable.   Past Medical History  Diagnosis Date  . Anxiety   . Chest pain     neg stress test summer 2010  . Migraines   . Endometriosis   . Sinusitis   . Ovarian torsion   . Allergic rhinitis     hx of testing and allergic to spring and fall f= pollens  . Anemia     nos  . GERD (gastroesophageal reflux disease)   . Endometriosis   . Hyperlipidemia   . Headache(784.0)   . Labial cyst 04/08/2011    early infection use hotpcompresses    . Thyroid disease     goiter  . STD (sexually transmitted disease)     Pos. Chlamydia--in her 27's  . Goiter   . Irritable bowel syndrome    Past Surgical History  Procedure Laterality Date  . Abdominal  surgery    . Oophrectomy    . Tubal ligation    . Unilateral salpingectomy      right  age 87 ectopics.  . Cesarean section  2002, 2004    x2  . Pelvic laparoscopy      x7    reports that she has never smoked. She has never used smokeless tobacco. She reports that she drinks about 0.5 oz of alcohol per week. She reports that she does not use illicit drugs. family history includes Anxiety disorder in her mother; Coronary artery disease in an other family member; Depression in her mother; Diabetes in her mother, paternal grandmother, and another family member; Heart attack in her father; Hyperlipidemia in an other family member; Hypertension in her mother and paternal grandmother; Other in an other family member; Seizures in her father; Stroke in her maternal grandmother. Allergies  Allergen Reactions  . Codeine Phosphate     REACTION: unspecified  . Moxifloxacin     REACTION: orickly sensation, joint pain  . Sulfamethoxazole     REACTION: unspecified      Outpatient Encounter Prescriptions as of 02/20/2014  Medication Sig  . Aspirin-Caffeine (BC FAST PAIN RELIEF) 845-65 MG PACK Take 65 mg by mouth every 8 (eight) hours as needed. Headache  . citalopram (CELEXA) 20 MG tablet Take 1 tablet (20 mg total) by  mouth daily.  . fluticasone (FLONASE) 50 MCG/ACT nasal spray Place 2 sprays into both nostrils daily.  Marland Kitchen ibuprofen (ADVIL,MOTRIN) 200 MG tablet Take 1,000 mg by mouth every 6 (six) hours as needed for moderate pain (headache).   . loratadine (CLARITIN) 10 MG tablet Take 10 mg by mouth daily as needed.   . Multiple Vitamins-Minerals (MULTIVITAMIN PO) Take 1 tablet by mouth daily.   Marland Kitchen triamterene-hydrochlorothiazide (MAXZIDE-25) 37.5-25 MG per tablet TAKE 1 TABLET BY MOUTH EVERY DAY  . pantoprazole (PROTONIX) 40 MG tablet Take 1 tablet (40 mg total) by mouth daily.     REVIEW OF SYSTEMS  : All other systems reviewed and negative except where noted in the History of Present  Illness.   PHYSICAL EXAM: BP 110/70 mmHg  Pulse 93  Ht 5\' 6"  (1.676 m)  Wt 243 lb 9.6 oz (110.496 kg)  BMI 39.34 kg/m2  SpO2 96%  LMP 01/31/2014 (Approximate) General: Well developed black female in no acute distress Head: Normocephalic and atraumatic Eyes:  Sclerae anicteric, conjunctiva pink. Ears: Normal auditory acuity Lungs: Clear throughout to auscultation Heart: Regular rate and rhythm Abdomen: Soft, non-distended.  BS present.  Mild RUQ TTP. Musculoskeletal: Symmetrical with no gross deformities  Skin: No lesions on visible extremities Extremities: No edema  Neurological: Alert oriented x 4, grossly non-focal Psychological:  Alert and cooperative. Normal mood and affect  ASSESSMENT AND PLAN: -RUQ abdominal pain:  Patient is concerned about her gallbladder.  Ultrasound is negative for gallbladder pathology, but we will check HIDA scan with CCK to rule out gallbladder dysfunction.  If negative and pain continues then may want to consider EGD to rule out ulcer disease, gastritis/duodenitis, etc especially with her intermittent BC and ibuprofen use. -Diarrhea:  May be related to gallbladder disease if this is in fact what is causing the RUQ abdominal pain, but will check stool pathogen panel as well to rule out infectious source. -GERD:  Will start pantoprazole 40 mg daily.  We will see if this improves her abdominal pain as well, but once again if HIDA scan is negative and symptoms continue despite PPI therapy then she may need EGD as stated above.   Addendum: Reviewed and agree with initial management. Await HIDA results and stool studies  Jerene Bears, MD

## 2014-02-25 NOTE — Telephone Encounter (Signed)
I think this is fine.  Thanks.

## 2014-02-25 NOTE — Telephone Encounter (Signed)
Left message for patient to call back. Need to go over benefits and schedule PUS. Pr $60

## 2014-02-25 NOTE — Telephone Encounter (Signed)
Pt returning call

## 2014-03-04 ENCOUNTER — Telehealth: Payer: Self-pay | Admitting: Obstetrics and Gynecology

## 2014-03-04 NOTE — Telephone Encounter (Signed)
Pt would like to discuss ultrasound results with Dr Quincy Simmonds.

## 2014-03-04 NOTE — Telephone Encounter (Signed)
Spoke with patient. She does not have specific questions for Dr. Quincy Simmonds at this time, but would like to discuss with Dr. Quincy Simmonds about her desires to have hysterectomy. Patient states she just had a cycle and "it was hell."  Advised patient if would like  to speak with Dr. Quincy Simmonds about symptoms and her request to have surgery, will need consult to discuss.  Appointment scheduled for 03/18/14 with Dr. Quincy Simmonds. Patient agreeable. Will call back with any concerns prior to appointment. Routing to provider for final review. Patient agreeable to disposition. Will close encounter

## 2014-03-18 ENCOUNTER — Institutional Professional Consult (permissible substitution): Payer: No Typology Code available for payment source | Admitting: Obstetrics and Gynecology

## 2014-03-18 ENCOUNTER — Telehealth: Payer: Self-pay | Admitting: Obstetrics and Gynecology

## 2014-03-18 NOTE — Telephone Encounter (Signed)
Call to patient regarding canceled appointment from today. Left message to call back, ask for [facility].

## 2014-03-19 ENCOUNTER — Other Ambulatory Visit: Payer: Self-pay | Admitting: Obstetrics and Gynecology

## 2014-03-19 DIAGNOSIS — N926 Irregular menstruation, unspecified: Secondary | ICD-10-CM

## 2014-03-19 DIAGNOSIS — D259 Leiomyoma of uterus, unspecified: Secondary | ICD-10-CM

## 2014-03-19 NOTE — Telephone Encounter (Signed)
After patient spoke to Scientist, clinical (histocompatibility and immunogenetics), she requested to speak back to me. Brief discussion on Dr Elza Rafter available dates, hospital stay and recover period depending of 3-6 weeks depending of procedure. Advised she really needs to decide if she is interested in proceeding and if so, needs consult ASAP because all further information is dependent on appointment with Dr Quincy Simmonds. Patient is aware Dr Quincy Simmonds will review call. If has additional information to help her in decision process I will call her to provide that, otherwise, I will wait to hear from patient on her decision.    Routing to Dr Quincy Simmonds, please advise.

## 2014-03-19 NOTE — Telephone Encounter (Signed)
Patient has a history of pelvic pain, endometriosis, ovarian cyst, and fibroids. She has had multiple prior abdominal surgeries.   If she would like to proceed with a hysterectomy, my recommendation would be for a robotic total laparoscopic hysterectomy with possible right salpingo-oophorectomy, cystoscopy, and possible lysis of adhesions.  She may need a total abdominal hysterectomy instead of laparoscopy if significant adhesive disease is encountered.   I am happy to proceed forward if the patient is ready for this surgical step.   We would need to have robotic time available if this is to be the route of surgery.  Time needed 3.5 hours.   She will need an endometrial biopsy performed as part of her prehysterectomy evaluation.   I will send this note through for precert and we can give her more information about the endometrial biopsy and surgical finances.

## 2014-03-19 NOTE — Telephone Encounter (Signed)
Patient returned call. Patient wants to discuss ultrasound results with Dr Silva. (from 02-21-14 appt with Dr Miller)  Interested in potential surgery and would like to have surgery before insurance change on 04-26-14. Has had several personal stressors and has some financial strain right now. Insurance deductible has been met. Advised may still have co-insurance. Will refer to insurance and billing to check estimate for possible surgery.   Sressed to patient that Dr Silva has not even discussed this with her yet. This is very early in the planning stage for surgery.  Patient states she really want hysterectomy.      

## 2014-04-09 ENCOUNTER — Encounter: Payer: Self-pay | Admitting: Gastroenterology

## 2014-04-09 ENCOUNTER — Encounter: Payer: Self-pay | Admitting: Internal Medicine

## 2014-04-10 ENCOUNTER — Encounter: Payer: Self-pay | Admitting: Internal Medicine

## 2014-04-11 ENCOUNTER — Other Ambulatory Visit: Payer: No Typology Code available for payment source | Admitting: Obstetrics and Gynecology

## 2014-04-11 ENCOUNTER — Other Ambulatory Visit: Payer: No Typology Code available for payment source

## 2014-04-11 NOTE — Telephone Encounter (Signed)
Follow-up call to patient. States she is still undecided about surgery at this time. She is also having GI evaluation done. For now, will just plan to talk to Dr Quincy Simmonds at ultrasound appointment on 04-25-14 and decide on plan after GI evaluation. Declines any further needs at this time.  Routing to provider for final review. Patient agreeable to disposition. Will close encounter

## 2014-04-16 ENCOUNTER — Encounter: Payer: No Typology Code available for payment source | Admitting: Internal Medicine

## 2014-04-25 ENCOUNTER — Other Ambulatory Visit: Payer: No Typology Code available for payment source

## 2014-04-25 ENCOUNTER — Ambulatory Visit (INDEPENDENT_AMBULATORY_CARE_PROVIDER_SITE_OTHER): Payer: No Typology Code available for payment source | Admitting: Obstetrics and Gynecology

## 2014-04-25 ENCOUNTER — Encounter: Payer: Self-pay | Admitting: Obstetrics and Gynecology

## 2014-04-25 ENCOUNTER — Other Ambulatory Visit: Payer: No Typology Code available for payment source | Admitting: Obstetrics & Gynecology

## 2014-04-25 ENCOUNTER — Ambulatory Visit (INDEPENDENT_AMBULATORY_CARE_PROVIDER_SITE_OTHER): Payer: No Typology Code available for payment source

## 2014-04-25 VITALS — BP 138/70 | HR 80 | Ht 66.0 in | Wt 240.0 lb

## 2014-04-25 DIAGNOSIS — N946 Dysmenorrhea, unspecified: Secondary | ICD-10-CM | POA: Diagnosis not present

## 2014-04-25 DIAGNOSIS — D251 Intramural leiomyoma of uterus: Secondary | ICD-10-CM | POA: Diagnosis not present

## 2014-04-25 DIAGNOSIS — N920 Excessive and frequent menstruation with regular cycle: Secondary | ICD-10-CM

## 2014-04-25 NOTE — Progress Notes (Signed)
Subjective  Patient is here to pelvic ultrasound today and discussion of potential hysterectomy.  Not feeling well since December.   Menses are every 3 weeks and now having heavy bleeding.  Menses can last 7 days.  Pad change up to every 4 hours and during the night.  Can still stain clothes.  Having right sided pain and vaginal pressure ( on the floor) and goes to the left.  Pressure is always there and it increases during her cycle.  Some urgency to void.   Just had  HIDA scan which was normal.  Gall bladder is normal. Has intense bouts of diarrhea around her cycle time. Feels tingling down into her legs.  No blood in the stool. Doing an upper endoscopy.  No colonoscopy other than 15 - 2- years ago.    Status post Left ovary secondary to torsion in 2010. History of endometriosis with multiple laparoscopies.  Status post C/S x 2.  Status post BTL 2004 at last Cesarean Section.  History of ectopic pregnancy x 3.  One laparotomy and two laparoscopies.  Had left tube removed.  Is not sure if she still has a right tube.  Not sexually active.  Has completed childbearing.   Has hypertension.   Considering surgery for this summer.  Would like to complete Gi work up also.   Objective   Pelvic ultrasound and images reviewed with patient.   Uterus with 2 fibroids: 4.5 and 1.86 cm. EMS 19.67 mm.  Right ovary with 2 small follicles.  No free fluid.      Assessment  Uterine fibroids - stable.  Dysmenorrhea.  I suspect endometriosis.  Heavy menstrual bleeding.  History of multiple prior laparoscopies and laparotomies.  Absent left tube and ovary.  Possible absent right tube versus just BTL. HTN.   Plan  Return for EMB.  Discussed medical therapy with progesterone options versus hysterectomy.  Reviewed hysterectomy - open versus robotic laparoscopic and right salpingo-oophorectomy.  I discussed risk of bleeding, infection, damage to surrounding organs, reaction to  anesthesia, DVT, PE, death, need for further operation, continued pain, development of scar tissue, and menopausal symptoms and need for ERT.  Will start Micronor after EMB done so patient can have a treatment option until surgery is performed.  Handouts on hysterectomy and DaVinci hysterectomy to patient.   25 minutes face to face time of which over 50% was spent in counseling.   After visit summary to patient.

## 2014-04-25 NOTE — Telephone Encounter (Signed)
Patient coverage terms 03.31.2016. Call to carrier and they have no record of her renewing with them. I will reach out to patient to obtain new coverage information.

## 2014-04-26 NOTE — Telephone Encounter (Signed)
Left message for patient to call back  

## 2014-04-26 NOTE — Telephone Encounter (Signed)
Patient returned call. Per patient, she has [employer] coverage effective 04.01.2016. She states that she doesn't have her new id cards yet. I tried to access the coverage on [employer] but was unsuccessful. Patient will contact the office as soon as she has the new cards to that we can proceed with pre-cert for EMB and possible surgery.

## 2014-04-29 ENCOUNTER — Encounter: Payer: No Typology Code available for payment source | Admitting: Internal Medicine

## 2014-04-29 NOTE — Telephone Encounter (Signed)
Spoke with patient. Advised of benefit quote received for the surgeon portion of her surgery. Advised that payment is due in full at least 3 weeks prior to the scheduled surgery date. Patient agreeable. Advised patient that she will receive separate communication from the hospital regarding facility charges/fees/payment.   Patient requested to speak with Gay Filler regarding tentative surgery dates.  Also advised patient to benefit quote received for EMB. Patient agreeable. Scheduled procedure. Advised patient of 72 hour cancellation policy and $165 cancellation fee. Patient agreeable.

## 2014-04-29 NOTE — Telephone Encounter (Signed)
Spoke to patient regarding surgery date options. Patient planning for late summer. Discussed available date options based on Robotic block time. 7-19 or 8-16 available. Patient will consider and let me know decision.

## 2014-05-01 ENCOUNTER — Ambulatory Visit (AMBULATORY_SURGERY_CENTER): Payer: Self-pay | Admitting: *Deleted

## 2014-05-01 VITALS — Ht 66.0 in | Wt 247.0 lb

## 2014-05-01 DIAGNOSIS — R1011 Right upper quadrant pain: Secondary | ICD-10-CM

## 2014-05-01 DIAGNOSIS — K219 Gastro-esophageal reflux disease without esophagitis: Secondary | ICD-10-CM

## 2014-05-01 NOTE — Progress Notes (Signed)
Patient denies any allergies to eggs or soy. Patient denies any problems with anesthesia/sedation. Patient denies any oxygen use at home and does not take any diet/weight loss medications. EMMI education assisgned to patient on upper Endoscopy, this was explained and instructions given to patient.

## 2014-05-08 ENCOUNTER — Ambulatory Visit (INDEPENDENT_AMBULATORY_CARE_PROVIDER_SITE_OTHER): Payer: BLUE CROSS/BLUE SHIELD | Admitting: Obstetrics and Gynecology

## 2014-05-08 VITALS — BP 100/70 | HR 64 | Temp 98.6°F | Resp 18 | Ht 66.0 in | Wt 244.0 lb

## 2014-05-08 DIAGNOSIS — R9389 Abnormal findings on diagnostic imaging of other specified body structures: Secondary | ICD-10-CM

## 2014-05-08 DIAGNOSIS — R938 Abnormal findings on diagnostic imaging of other specified body structures: Secondary | ICD-10-CM | POA: Diagnosis not present

## 2014-05-08 DIAGNOSIS — N920 Excessive and frequent menstruation with regular cycle: Secondary | ICD-10-CM

## 2014-05-08 MED ORDER — NORETHINDRONE 0.35 MG PO TABS: 1.0000 | ORAL_TABLET | Freq: Every day | ORAL | Status: DC

## 2014-05-08 NOTE — Patient Instructions (Signed)
Endometrial Biopsy, Care After Refer to this sheet in the next few weeks. These instructions provide you with information on caring for yourself after your procedure. Your health care provider may also give you more specific instructions. Your treatment has been planned according to current medical practices, but problems sometimes occur. Call your health care provider if you have any problems or questions after your procedure. WHAT TO EXPECT AFTER THE PROCEDURE After your procedure, it is typical to have the following:  You may have mild cramping and a small amount of vaginal bleeding for a few days after the procedure. This is normal. HOME CARE INSTRUCTIONS  Only take over-the-counter or prescription medicine as directed by your health care provider.  Do not douche, use tampons, or have sexual intercourse until your health care provider approves.  Follow your health care provider's instructions regarding any activity restrictions, such as strenuous exercise or heavy lifting. SEEK MEDICAL CARE IF:  You have heavy bleeding or bleeding longer than 2 days after the procedure.  You have bad smelling drainage from your vagina.  You have a fever and chills.  Youhave severe lower stomach (abdominal) pain. SEEK IMMEDIATE MEDICAL CARE IF:  You have severe cramps in your stomach or back.  You pass large blood clots.  Your bleeding increases.  You become weak or lightheaded, or you pass out. Document Released: 11/01/2012 Document Reviewed: 11/01/2012 ExitCare Patient Information 2015 [employer]. This information is not intended to replace advice given to you by your health care provider. Make sure you discuss any questions you have with your health care provider.  

## 2014-05-08 NOTE — Progress Notes (Signed)
GYNECOLOGY  VISIT   HPI: 44 y.o.   Single  African American  female   [phone] with Patient's last menstrual period was 05/07/2014.   here for  EMB for menorrhagia with regular cycles. Has fibroids and pelvic pain.  Patient planning for hysterectomy.   Last Pap: 09/10/13 Neg. HR HPV:neg Last MMG: 12/05/13 BIRADS1:neg  GYNECOLOGIC HISTORY: Patient's last menstrual period was 05/07/2014. Contraception:   Tubal Ligation  Menopausal hormone therapy: none        OB History    Gravida Para Term Preterm AB TAB SAB Ectopic Multiple Living   7 2   5 1 1 3  2       Obstetric Comments   3 ectopic         Patient Active Problem List   Diagnosis Date Noted  . RUQ pain 02/25/2014  . Diarrhea 02/25/2014  . Calcium oxalate crystals in urine 02/22/2014  . Intramural leiomyoma of uterus 02/22/2014  . Hx of migraine headaches 02/04/2014  . Medication management 02/04/2014  . Hx of endometriosis 02/04/2014  . Right lower quadrant abdominal pain mid also 02/04/2014  . Adjustment disorder with anxious mood 01/17/2014  . Thyroid mass 02/16/2013  . Fever, unspecified 02/15/2013  . Sinusitis 02/15/2013  . Anxiety 12/11/2011  . Stress 12/11/2011  . Irregular periods 08/17/2011  . Endometriosis 08/17/2011  . Edema 05/10/2011  . Elevated blood pressure reading 05/10/2011  . Fatigue 04/08/2011  . Protracted URI 04/08/2011  . SINUSITIS, RECURRENT 11/06/2009  . FATIGUE 02/25/2009  . PALPITATIONS 02/25/2009  . DYSPNEA 02/25/2009  . KNEE INJURY, RIGHT 12/05/2007  . HEADACHE 05/03/2007  . ADULT SITUATIONAL REACTION 11/21/2006  . SLEEPLESSNESS 11/21/2006  . CHEST PAIN 11/21/2006  . TINEA PEDIS 08/31/2006  . Other and unspecified hyperlipidemia 08/31/2006  . DISORDER, ADJUSTMENT W/ANXIETY 08/31/2006  . PLANTAR FASCIITIS 08/31/2006  . ANEMIA-NOS 06/23/2006  . MIGRAINE, COMMON 06/23/2006  . ALLERGIC RHINITIS 06/23/2006  . GERD 06/23/2006  . IRRITABLE BOWEL SYNDROME 06/23/2006    Past  Medical History  Diagnosis Date  . Anxiety   . Chest pain     neg stress test summer 2010  . Migraines   . Endometriosis   . Sinusitis   . Ovarian torsion   . Allergic rhinitis     hx of testing and allergic to spring and fall f= pollens  . Anemia     nos  . GERD (gastroesophageal reflux disease)   . Endometriosis   . Hyperlipidemia   . Headache(784.0)   . Labial cyst 04/08/2011    early infection use hotpcompresses    . Thyroid disease     goiter  . STD (sexually transmitted disease)     Pos. Chlamydia--in her 52's  . Goiter   . Irritable bowel syndrome     Past Surgical History  Procedure Laterality Date  . Abdominal surgery    . Oophrectomy    . Tubal ligation    . Unilateral salpingectomy      right  age 42 ectopics.  . Cesarean section  2002, 2004    x2  . Pelvic laparoscopy      x7    Current Outpatient Prescriptions  Medication Sig Dispense Refill  . Aspirin-Caffeine (BC FAST PAIN RELIEF) 845-65 MG PACK Take 65 mg by mouth every 8 (eight) hours as needed. Headache    . citalopram (CELEXA) 20 MG tablet Take 1 tablet (20 mg total) by mouth daily. 90 tablet 1  . fluticasone (FLONASE) 50 MCG/ACT nasal  spray Place 2 sprays into both nostrils daily. 1 g prn  . ibuprofen (ADVIL,MOTRIN) 200 MG tablet Take 1,000 mg by mouth every 6 (six) hours as needed for moderate pain (headache).     . loratadine (CLARITIN) 10 MG tablet Take 10 mg by mouth daily as needed.     . Multiple Vitamins-Minerals (MULTIVITAMIN PO) Take 1 tablet by mouth daily.     . pantoprazole (PROTONIX) 40 MG tablet Take 1 tablet (40 mg total) by mouth daily. 90 tablet 3  . triamterene-hydrochlorothiazide (MAXZIDE-25) 37.5-25 MG per tablet TAKE 1 TABLET BY MOUTH EVERY DAY 90 tablet 0   No current facility-administered medications for this visit.     ALLERGIES: Codeine phosphate; Moxifloxacin; and Sulfamethoxazole  Family History  Problem Relation Age of Onset  . Anxiety disorder Mother     mother  is on xanax and apparently applying for  social security diabilty for her anxiety. t  . Depression Mother   . Diabetes Mother   . Hypertension Mother   . Coronary artery disease    . Diabetes    . Hyperlipidemia    . Other      history of child death in family   . Heart attack Father   . Seizures Father   . Stroke Maternal Grandmother   . Diabetes Paternal Grandmother   . Hypertension Paternal Grandmother   . Colon cancer Neg Hx   . Stomach cancer Neg Hx   . Esophageal cancer Neg Hx     History   Social History  . Marital Status: Single    Spouse Name: N/A  . Number of Children: N/A  . Years of Education: N/A   Occupational History  . Not on file.   Social History Main Topics  . Smoking status: Never Smoker   . Smokeless tobacco: Never Used  . Alcohol Use: 0.6 oz/week    1 Standard drinks or equivalent per week     Comment: occ  . Drug Use: No  . Sexual Activity:    Partners: Male    Birth Control/ Protection: Surgical     Comment: Tubal   Other Topics Concern  . 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   And graduated as SW.      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  PHYSICAL EXAMINATION:    BP 100/70 mmHg  Pulse 64  Temp(Src) 98.6 F (37 C) (Oral)  Resp 18  Ht 5\' 6"  (1.676 m)  Wt 244 lb (110.678 kg)  BMI 39.40 kg/m2  LMP 05/07/2014     General appearance: alert, cooperative and appears stated age  Procedure - endometrial biopsy Consent performed. Speculum place in  vagina.  Sterile prep of cervix with Hibiclens.  Paracervical block with 10 cc 1% lidocaine.  Lot number [id number], exp 06/26/14. Pipelle placed to  8      cm without difficulty twice. Tissue obtained and sent to pathology. Speculum removed.  No complications. Minimal EBL.  ASSESSMENT  Menorrhagia with regular cycles.   Fibroids and pelvic pain.  Status post Left ovary secondary to torsion in 2010. History of endometriosis with multiple laparoscopies.  Status post C/S x 2.  Status post BTL 2004 at last Cesarean Section.  History of ectopic pregnancy x 3. One laparotomy and two laparoscopies. Had left tube removed. Is not sure if she still has a right tube.  PLAN  Follow up EMB results.  Instructions and precautions given.  OK to start Micronor now and continue until hysterectomy completed.  Instructed in use.  3 packs and 3 refills.    An After Visit Summary was printed and given to the patient.

## 2014-05-12 ENCOUNTER — Encounter: Payer: Self-pay | Admitting: Obstetrics and Gynecology

## 2014-05-13 LAB — IPS OTHER TISSUE BIOPSY

## 2014-05-14 ENCOUNTER — Encounter: Payer: Self-pay | Admitting: Internal Medicine

## 2014-05-15 ENCOUNTER — Encounter: Payer: Self-pay | Admitting: Internal Medicine

## 2014-05-15 ENCOUNTER — Ambulatory Visit (AMBULATORY_SURGERY_CENTER): Payer: BLUE CROSS/BLUE SHIELD | Admitting: Internal Medicine

## 2014-05-15 VITALS — BP 122/55 | HR 66 | Temp 98.5°F | Resp 22 | Ht 66.0 in | Wt 247.0 lb

## 2014-05-15 DIAGNOSIS — R1011 Right upper quadrant pain: Secondary | ICD-10-CM | POA: Diagnosis not present

## 2014-05-15 DIAGNOSIS — K219 Gastro-esophageal reflux disease without esophagitis: Secondary | ICD-10-CM

## 2014-05-15 MED ORDER — SODIUM CHLORIDE 0.9 % IV SOLN
500.0000 mL | INTRAVENOUS | Status: DC
Start: 2014-05-15 — End: 2014-05-15

## 2014-05-15 NOTE — Progress Notes (Signed)
Report to PACU, RN, vss, BBS= Clear.  

## 2014-05-15 NOTE — Patient Instructions (Signed)
YOU HAD AN ENDOSCOPIC PROCEDURE TODAY AT [facility]:   Refer to the procedure report that was given to you for any specific questions about what was found during the examination.  If the procedure report does not answer your questions, please call your gastroenterologist to clarify.  If you requested that your care partner not be given the details of your procedure findings, then the procedure report has been included in a sealed envelope for you to review at your convenience later.  YOU SHOULD EXPECT: Some feelings of bloating in the abdomen. Passage of more gas than usual.  Walking can help get rid of the air that was put into your GI tract during the procedure and reduce the bloating. If you had a lower endoscopy (such as a colonoscopy or flexible sigmoidoscopy) you may notice spotting of blood in your stool or on the toilet paper. If you underwent a bowel prep for your procedure, you may not have a normal bowel movement for a few days.  Please Note:  You might notice some irritation and congestion in your nose or some drainage.  This is from the oxygen used during your procedure.  There is no need for concern and it should clear up in a day or so.  SYMPTOMS TO REPORT IMMEDIATELY:   Following upper endoscopy (EGD)  Vomiting of blood or coffee ground material  New chest pain or pain under the shoulder blades  Painful or persistently difficult swallowing  New shortness of breath  Fever of 100F or higher  Black, tarry-looking stools  For urgent or emergent issues, a gastroenterologist can be reached at any hour by calling [phone].   DIET: Your first meal following the procedure should be a small meal and then it is ok to progress to your normal diet. Heavy or fried foods are harder to digest and may make you feel nauseous or bloated.  Likewise, meals heavy in dairy and vegetables can increase bloating.  Drink plenty of fluids but you should avoid alcoholic beverages for  24 hours.  ACTIVITY:  You should plan to take it easy for the rest of today and you should NOT DRIVE or use heavy machinery until tomorrow (because of the sedation medicines used during the test).    FOLLOW UP: Our staff will call the number listed on your records the next business day following your procedure to check on you and address any questions or concerns that you may have regarding the information given to you following your procedure. If we do not reach you, we will leave a message.  However, if you are feeling well and you are not experiencing any problems, there is no need to return our call.  We will assume that you have returned to your regular daily activities without incident.  If any biopsies were taken you will be contacted by phone or by letter within the next 1-3 weeks.  Please call us at [phone] if you have not heard about the biopsies in 3 weeks.    SIGNATURES/CONFIDENTIALITY: You and/or your care partner have signed paperwork which will be entered into your electronic medical record.  These signatures attest to the fact that that the information above on your After Visit Summary has been reviewed and is understood.  Full responsibility of the confidentiality of this discharge information lies with you and/or your care-partner.  Recommendations Discharge instructions given to patient and/or care partner. GERD handout provided.

## 2014-05-15 NOTE — Op Note (Signed)
[place]  [address] [place], [id number]   ENDOSCOPY PROCEDURE REPORT  PATIENT: [name], [name]  MR#: [id number] BIRTHDATE: [birth year] , 44  yrs. old GENDER: female ENDOSCOPIST: Eustace Quail, MD REFERRED BY:  Burnis Medin, M.D. PROCEDURE DATE:  05/15/2014 PROCEDURE:  EGD, diagnostic ASA CLASS:     Class II INDICATIONS:  abdominal pain in the upper right quadrant and history of esophageal reflux. MEDICATIONS: Monitored anesthesia care and Propofol 200 mg IV TOPICAL ANESTHETIC: none  DESCRIPTION OF PROCEDURE: After the risks benefits and alternatives of the procedure were thoroughly explained, informed consent was obtained.  The [facility] [id number] endoscope was introduced through the mouth and advanced to the second portion of the duodenum , Without limitations.  The instrument was slowly withdrawn as the mucosa was fully examined.    EXAM: The esophagus and gastroesophageal junction were completely normal in appearance.  The stomach was entered and closely examined.The antrum, angularis, and lesser curvature were well visualized, including a retroflexed view of the cardia and fundus. The stomach wall was normally distensable.  The scope passed easily through the pylorus into the duodenum.  Retroflexed views revealed no abnormalities.     The scope was then withdrawn from the patient and the procedure completed.  COMPLICATIONS: There were no immediate complications.  ENDOSCOPIC IMPRESSION: 1. Normal EGD 2. GERD  RECOMMENDATIONS: 1.  Anti-reflux regimen to be followed 2.  Continue PPI  (acid reflux medication) 3. Complete your gynecology evaluation. GI follow-up as needed  REPEAT EXAM:  eSigned:  Eustace Quail, MD 05/15/2014 2:54 PM    CC:The Patient and Burnis Medin, MD

## 2014-05-16 ENCOUNTER — Telehealth: Payer: Self-pay

## 2014-05-16 NOTE — Telephone Encounter (Signed)
  Follow up Call-  Call back number 05/15/2014  Post procedure Call Back phone  # [phone]  Permission to leave phone message Yes     Patient questions:  Do you have a fever, pain , or abdominal swelling? No. Pain Score  0 *  Have you tolerated food without any problems? Yes.    Have you been able to return to your normal activities? Yes.    Do you have any questions about your discharge instructions: Diet   No. Medications  No. Follow up visit  No.  Do you have questions or concerns about your Care? No.  Actions: * If pain score is 4 or above: No action needed, pain <4.  No problems per the pt. maw

## 2014-05-21 ENCOUNTER — Other Ambulatory Visit: Payer: Self-pay | Admitting: Internal Medicine

## 2014-05-22 ENCOUNTER — Telehealth: Payer: Self-pay | Admitting: Family Medicine

## 2014-05-22 NOTE — Telephone Encounter (Signed)
BMP on 08/27/2013 done by another provider.  Please advise.  Thanks!

## 2014-05-22 NOTE — Telephone Encounter (Signed)
Ok to refill through  August and then needs yearly  OV.

## 2014-05-22 NOTE — Telephone Encounter (Signed)
Per [facility], pt needs CPX and lab appointments in August.  Please schedule both.  Send message back to me when complete and I will enter lab results.

## 2014-05-22 NOTE — Telephone Encounter (Signed)
Sent to the pharmacy by e-scribe. 

## 2014-05-23 ENCOUNTER — Other Ambulatory Visit: Payer: Self-pay | Admitting: Family Medicine

## 2014-05-23 DIAGNOSIS — Z0000 Encounter for general adult medical examination without abnormal findings: Secondary | ICD-10-CM

## 2014-05-23 NOTE — Telephone Encounter (Signed)
Pt notified that orders have been placed for the [place] office.

## 2014-05-23 NOTE — Telephone Encounter (Signed)
Pt has sch for cpx. Pt would like to go to [facility] for cpx labs. Please put order in

## 2014-06-13 ENCOUNTER — Telehealth: Payer: Self-pay | Admitting: Emergency Medicine

## 2014-06-13 ENCOUNTER — Encounter: Payer: Self-pay | Admitting: Obstetrics and Gynecology

## 2014-06-13 NOTE — Telephone Encounter (Signed)
Replied to patient via mychart. Advised would request provider review her message and would return call with response.

## 2014-06-13 NOTE — Telephone Encounter (Signed)
From: [name]    Sent: 06/13/2014 11:52 AM EDT      To: Arloa Koh, MD Subject: Non-Urgent Medical Question  Dr. Quincy Simmonds, I hope this letter finds you well. I just wanted to check in with you. I feel so much better since starting the Jolivette 0.35mg   daily. My energy level is up. I am sleeping better at night. I am not having a period or the bad diarrhea associated with my periods. The pain is so much better and less frequent , with only 1 or 2 days of bad cramping , instead of the whole month.  I want to wait and see if my health continues improve or even if it stay the same as it is right now I am very pleased.  I want to post-pone any plans for surgery at this this and reevaluate during my annual exam in August unless you have some concerns about me waiting. Thanks  [employer]

## 2014-06-13 NOTE — Telephone Encounter (Signed)
Chief Complaint  Patient presents with  . Advice Only    Patient sent mychart message   Dr. Quincy Simmonds can you review and advise? Patient started progesterone only pills 05/08/14.

## 2014-06-13 NOTE — Telephone Encounter (Signed)
I am pleased that [name] is doing well on the progesterone only oral contraceptives! I do not see a reason for her to do the hysterectomy if she is doing well and please with the treatment plan of taking these pills.  Please make sure she has enough to get her to her annual exam appointment in August with me.  Cc - Lamont Snowball

## 2014-06-14 NOTE — Telephone Encounter (Signed)
Spoke with patient and message from Dr. Quincy Simmonds given. Patient agreeable. She has enough of her pills to last until annual exam. She is advised to return call with any problems or concerns prior to annual exam with Dr. Quincy Simmonds and patient agreeable. Routing to provider for final review. Patient agreeable to disposition. Will close encounter.

## 2014-07-04 ENCOUNTER — Encounter: Payer: Self-pay | Admitting: Internal Medicine

## 2014-07-05 ENCOUNTER — Encounter: Payer: Self-pay | Admitting: Family Medicine

## 2014-07-05 ENCOUNTER — Encounter: Payer: Self-pay | Admitting: Internal Medicine

## 2014-07-05 ENCOUNTER — Ambulatory Visit (INDEPENDENT_AMBULATORY_CARE_PROVIDER_SITE_OTHER): Payer: BLUE CROSS/BLUE SHIELD | Admitting: Internal Medicine

## 2014-07-05 VITALS — BP 116/72 | Temp 98.4°F | Ht 66.0 in | Wt 246.1 lb

## 2014-07-05 DIAGNOSIS — G43809 Other migraine, not intractable, without status migrainosus: Secondary | ICD-10-CM

## 2014-07-05 DIAGNOSIS — J329 Chronic sinusitis, unspecified: Secondary | ICD-10-CM | POA: Diagnosis not present

## 2014-07-05 DIAGNOSIS — G43009 Migraine without aura, not intractable, without status migrainosus: Secondary | ICD-10-CM

## 2014-07-05 MED ORDER — KETOROLAC TROMETHAMINE 60 MG/2ML IM SOLN
60.0000 mg | Freq: Once | INTRAMUSCULAR | Status: AC
  Administered 2014-07-05: 60 mg via INTRAMUSCULAR

## 2014-07-05 MED ORDER — PROMETHAZINE HCL 25 MG PO TABS: 25.0000 mg | ORAL_TABLET | Freq: Three times a day (TID) | ORAL | Status: DC | PRN

## 2014-07-05 MED ORDER — PREDNISONE 10 MG PO TABS: ORAL_TABLET | ORAL | Status: DC

## 2014-07-05 NOTE — Progress Notes (Signed)
Pre visit review using our clinic review tool, if applicable. No additional management support is needed unless otherwise documented below in the visit note.  Chief Complaint  Patient presents with  . Headache    X11Days  . Sinus Pressure/Pain  . Cough  . Nasal Congestion    HPI: Patient [name] comes in today for SDA for  new problem evaluation.  For the last 10 days she believes she has had one of her atypical MIGRAINE    diagnosed and evaluated by Dr. love on number of years ago. She gets left-sided face pain symptoms discomfort  deficulty work findings  and then radiation on the left hand ulnar and pinky see finger and left toes that feels like the skin is coming off. In the past the best treatment was IV push Depakote. Trip tans never worked. She has had these symptoms since about the end of May 10 days and continuing. No fever but some congestion like allergy feels like her left eye is bulging out. Gets lefts sided  Sx discomfort    Not until 27th  No vitin some nausea some floater.  bothe eyes.  At end of may .   Still on protonix   Still for acid relflux.    Per GI.  Left sided nasal congestion no diagnosis neck pain.  ROS: See pertinent positives and negatives per HPI.  Past Medical History  Diagnosis Date  . Anxiety   . Chest pain     neg stress test summer 2010  . Migraines   . Endometriosis   . Sinusitis   . Ovarian torsion   . Allergic rhinitis     hx of testing and allergic to spring and fall f= pollens  . Anemia     nos  . GERD (gastroesophageal reflux disease)   . Endometriosis   . Hyperlipidemia   . Headache(784.0)   . Labial cyst 04/08/2011    early infection use hotpcompresses    . Thyroid disease     goiter  . STD (sexually transmitted disease)     Pos. Chlamydia--in her 74's  . Goiter   . Irritable bowel syndrome     Family History  Problem Relation Age of Onset  . Anxiety disorder Mother     mother is on xanax and apparently applying  for  social security diabilty for her anxiety. t  . Depression Mother   . Diabetes Mother   . Hypertension Mother   . Coronary artery disease    . Diabetes    . Hyperlipidemia    . Other      history of child death in family   . Heart attack Father   . Seizures Father   . Stroke Maternal Grandmother   . Diabetes Paternal Grandmother   . Hypertension Paternal Grandmother   . Colon cancer Neg Hx   . Stomach cancer Neg Hx   . Esophageal cancer Neg Hx     History   Social History  . Marital Status: Single    Spouse Name: N/A  . Number of Children: N/A  . Years of Education: N/A   Social History Main Topics  . Smoking status: Never Smoker   . Smokeless tobacco: Never Used  . Alcohol Use: 0.6 oz/week    1 Standard drinks or equivalent per week     Comment: occ  . Drug Use: No  . Sexual Activity:    Partners: Male    Birth Control/ Protection: Surgical  Comment: Tubal   Other Topics Concern  . 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   And graduated as SW.      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 Aspirin-Caffeine (BC FAST PAIN RELIEF) 845-65 MG PACK Take 65 mg by mouth every 8 (eight) hours as needed. Headache    . citalopram (CELEXA) 20 MG tablet Take 1 tablet (20 mg total) by mouth daily. 90 tablet 1  . fluticasone (FLONASE) 50 MCG/ACT nasal spray Place 2 sprays into both nostrils daily. 1 g prn  . ibuprofen (ADVIL,MOTRIN) 200 MG tablet Take 1,000 mg by mouth  every 6 (six) hours as needed for moderate pain (headache).     . loratadine (CLARITIN) 10 MG tablet Take 10 mg by mouth daily as needed.     . Multiple Vitamins-Minerals (MULTIVITAMIN PO) Take 1 tablet by mouth daily.     . norethindrone (MICRONOR,CAMILA,ERRIN) 0.35 MG tablet Take 1 tablet (0.35 mg total) by mouth daily. 3 Package 1  . pantoprazole (PROTONIX) 40 MG tablet Take 1 tablet (40 mg total) by mouth daily. 90 tablet 3  . triamterene-hydrochlorothiazide (MAXZIDE-25) 37.5-25 MG per tablet TAKE 1 TABLET BY MOUTH DAILY 30 tablet 3   No facility-administered medications prior to visit.     EXAM:  BP 116/72 mmHg  Temp(Src) 98.4 F (36.9 C) (Oral)  Ht 5\' 6"  (1.676 m)  Wt 246 lb 1.6 oz (111.63 kg)  BMI 39.74 kg/m2  Body mass index is 39.74 kg/(m^2).  GENERAL: vitals reviewed and listed above, alert, oriented, appears well hydrated and in no acute distress midl discomfort  HEENT: atraumatic, conjunctiva  clear, no obvious abnormalities on inspection of external nose and ears OP : no lesion edema or exudate nares minimal congestion some mild tenderness left maxillary face no swelling TMs clear NECK: no obvious masses on inspection palpation  LUNGS: clear to auscultation bilaterally, no wheezes, rales or rhonchi, good air movement CV: HRRR, no clubbing cyanosis or  peripheral edema nl cap refill  MS: moves all extremities without noticeable focal  Abnormality Neurologically appears intact with no obvious weakness normal range of motion. PSYCH: pleasant and cooperative, no obvious depression or anxiety BP Readings from Last 3 Encounters:  07/05/14 116/72  05/15/14 122/55  05/08/14 100/70   Wt Readings from Last 3 Encounters:  07/05/14 246 lb 1.6 oz (111.63 kg)  05/15/14 247 lb (112.038 kg)  05/08/14 244 lb (110.678 kg)      ASSESSMENT AND PLAN:  Discussed the following assessment and plan:  Atypical migraine - Plan: ketorolac (TORADOL) injection 60 mg  Chronic recurrent  sinusitis Toradol and  pred trial   Cont nasal cortisone.  Hemi distribution for migraine  And poss underlying mild sinu inflammation no obv bacterial issue   rx as below   -Patient advised to return or  notify health care team  if symptoms worsen ,persist or new concerns arise.  Patient Instructions  Toradol for migraine today and course of prednisone that can help some migraines and  Also sinus inflammation  After finishing take continue  flonase of nasacort for any continuing sinus problem . Also can use  Antihistamines as a trial.   Keep appt with [facility] as we discussed .  Can add phenergan if helps you nausea   In the short run.    Standley Brooking. Panosh M.D.

## 2014-07-05 NOTE — Patient Instructions (Addendum)
Toradol for migraine today and course of prednisone that can help some migraines and  Also sinus inflammation  After finishing take continue  flonase of nasacort for any continuing sinus problem . Also can use  Antihistamines as a trial.   Keep appt with [facility] as we discussed .  Can add phenergan if helps you nausea   In the short run.

## 2014-07-09 ENCOUNTER — Telehealth: Payer: Self-pay | Admitting: Internal Medicine

## 2014-07-09 DIAGNOSIS — G43D0 Abdominal migraine, not intractable: Secondary | ICD-10-CM

## 2014-07-09 DIAGNOSIS — R531 Weakness: Secondary | ICD-10-CM

## 2014-07-09 NOTE — Telephone Encounter (Signed)
Patient stated symptoms are the same since MD Panosh saw her last week. Symptoms have not gotten better and have not gotten worse. Placed stat referral for neurology per MD Panosh's verbal order. MD Panosh also stated to cancel appointment with [facility] since [facility] neurology will be seeing her. Called patient and updated with information and to look out for phone call from neurology. Also educated patient if symptoms got worse to go to ED. Patient verbalized understanding.

## 2014-07-09 NOTE — Telephone Encounter (Signed)
Patient Name: [facility]ann [facility] DOB: 1970-03-08 Initial Comment caller states she saw the dr on Friday for a migraine and a sinus infection - still has a migraine Nurse Assessment Nurse: Marcelline Deist, RN, Kermit Balo Date/Time (Eastern Time): 07/09/2014 11:06:21 AM Confirm and document reason for call. If symptomatic, describe symptoms. ---Caller states she saw the Dr. on Friday for a migraine and  sinusitis, still has a migraine. Still has stroke-like symptoms. No longer has a neurologist. IV Depokote push is the only rx that usually works. Even after taking Tordol & Prednisone taper with Phenergan, it is not helping. Has had the migranes for about 14 days. Has the patient traveled out of the country within the last 30 days? ---Not Applicable Does the patient require triage? ---Yes Related visit to physician within the last 2 weeks? ---Yes Does the PT have any chronic conditions? (i.e. diabetes, asthma, etc.) ---Yes List chronic conditions. ---migranes, endometriosis, female issues Did the patient indicate they were pregnant? ---No Guidelines Guideline Title Affirmed Question Affirmed Notes Headache Severe pain in one eye Final Disposition User Go to ED Now Marcelline Deist, RN, Lynda Comments Caller states she has left sided stroke-like symptoms with the migranes where it feels like her face is drooping, heaviness in arm where she doesn't want to try to lift or hold something. Caller does not want to be seen in the ER d/t them giving her typical migrane rx which doesn't work. Would like further advice from her Dr. Donivan Scull: seeing [facility] [place] neurologist, or finding a way to get the IV Depakote push rx. which has worked in the past. Nurse will notify office that patient will not come to ER per outcome. Nurse spoke with office staff & made them aware that patient will not be going to the ER, but would like further advice from her Dr.

## 2014-07-16 ENCOUNTER — Encounter (HOSPITAL_COMMUNITY): Payer: Self-pay | Admitting: Emergency Medicine

## 2014-07-16 ENCOUNTER — Emergency Department (HOSPITAL_COMMUNITY)
Admission: EM | Admit: 2014-07-16 | Discharge: 2014-07-16 | Disposition: A | Discharge status: Home / Self Care | Payer: BLUE CROSS/BLUE SHIELD | Attending: Emergency Medicine | Admitting: Emergency Medicine

## 2014-07-16 DIAGNOSIS — Z8619 Personal history of other infectious and parasitic diseases: Secondary | ICD-10-CM | POA: Diagnosis not present

## 2014-07-16 DIAGNOSIS — Z862 Personal history of diseases of the blood and blood-forming organs and certain disorders involving the immune mechanism: Secondary | ICD-10-CM | POA: Insufficient documentation

## 2014-07-16 DIAGNOSIS — Z8742 Personal history of other diseases of the female genital tract: Secondary | ICD-10-CM | POA: Diagnosis not present

## 2014-07-16 DIAGNOSIS — G43901 Migraine, unspecified, not intractable, with status migrainosus: Secondary | ICD-10-CM | POA: Diagnosis not present

## 2014-07-16 DIAGNOSIS — K219 Gastro-esophageal reflux disease without esophagitis: Secondary | ICD-10-CM | POA: Insufficient documentation

## 2014-07-16 DIAGNOSIS — Z8639 Personal history of other endocrine, nutritional and metabolic disease: Secondary | ICD-10-CM | POA: Insufficient documentation

## 2014-07-16 DIAGNOSIS — Z7951 Long term (current) use of inhaled steroids: Secondary | ICD-10-CM | POA: Diagnosis not present

## 2014-07-16 DIAGNOSIS — F419 Anxiety disorder, unspecified: Secondary | ICD-10-CM | POA: Insufficient documentation

## 2014-07-16 DIAGNOSIS — G43909 Migraine, unspecified, not intractable, without status migrainosus: Secondary | ICD-10-CM | POA: Diagnosis present

## 2014-07-16 LAB — BASIC METABOLIC PANEL
Anion gap: 9 (ref 5–15)
BUN: 12 mg/dL (ref 6–20)
CO2: 29 mmol/L (ref 22–32)
Calcium: 8.8 mg/dL — ABNORMAL LOW (ref 8.9–10.3)
Chloride: 99 mmol/L — ABNORMAL LOW (ref 101–111)
Creatinine, Ser: 1.16 mg/dL — ABNORMAL HIGH (ref 0.44–1.00)
GFR calc Af Amer: >60 mL/min (ref >60)
GFR, EST NON AFRICAN AMERICAN: 57 mL/min — AB (ref >60)
GLUCOSE: 92 mg/dL (ref 65–99)
Potassium: 4.7 mmol/L (ref 3.5–5.1)
SODIUM: 137 mmol/L (ref 135–145)

## 2014-07-16 LAB — CBC
HCT: 39.7 % (ref 36.0–46.0)
Hemoglobin: 12.6 g/dL (ref 12.0–15.0)
MCH: 28.9 pg (ref 26.0–34.0)
MCHC: 31.7 g/dL (ref 30.0–36.0)
MCV: 91.1 fL (ref 78.0–100.0)
PLATELETS: 467 10*3/uL — AB (ref 150–400)
RBC: 4.36 MIL/uL (ref 3.87–5.11)
RDW: 14.1 % (ref 11.5–15.5)
WBC: 17 10*3/uL — ABNORMAL HIGH (ref 4.0–10.5)

## 2014-07-16 MED ORDER — HYDROMORPHONE HCL 1 MG/ML IJ SOLN
1.0000 mg | Freq: Once | INTRAMUSCULAR | Status: AC
  Administered 2014-07-16: 1 mg via INTRAVENOUS
  Filled 2014-07-16: qty 1

## 2014-07-16 MED ORDER — DIPHENHYDRAMINE HCL 50 MG/ML IJ SOLN
25.0000 mg | Freq: Once | INTRAMUSCULAR | Status: AC
  Administered 2014-07-16: 25 mg via INTRAVENOUS
  Filled 2014-07-16: qty 1

## 2014-07-16 MED ORDER — PROCHLORPERAZINE EDISYLATE 5 MG/ML IJ SOLN
10.0000 mg | Freq: Once | INTRAMUSCULAR | Status: AC
  Administered 2014-07-16: 10 mg via INTRAVENOUS
  Filled 2014-07-16: qty 2

## 2014-07-16 MED ORDER — BUTALBITAL-ASPIRIN-CAFFEINE 50-325-40 MG PO CAPS: 1.0000 | ORAL_CAPSULE | Freq: Four times a day (QID) | ORAL | Status: DC | PRN

## 2014-07-16 NOTE — ED Provider Notes (Signed)
CSN: [id number]     Arrival date & time 07/16/14  1802 History   First MD Initiated Contact with Patient 07/16/14 1828     Chief Complaint  Patient presents with  . Migraine    HPI Patient presents to the emergency room with complaints of a persistent migraine. Patient states she's had migraines since the age of 44. They vary in severity and sometimes she has neurologic symptoms associated with them. This particular migraine started about 26 days ago. As been waxing and waning but never completely resolving. She saw her primary doctor and was given an injection of Toradol and prednisone prescription. Unfortunately her symptoms have persisted. She is scheduled to see a neurologist in July but came to the emergency room because of her persistent symptoms. Her headache is primarily on the left side. She does feel some burning sometimes in her face and arms and legs on that same side. She denies any fevers or chills. She denies any injuries. No trouble with her speech balance or coordination. Past Medical History  Diagnosis Date  . Anxiety   . Chest pain     neg stress test summer 2010  . Migraines   . Endometriosis   . Sinusitis   . Ovarian torsion   . Allergic rhinitis     hx of testing and allergic to spring and fall f= pollens  . Anemia     nos  . GERD (gastroesophageal reflux disease)   . Endometriosis   . Hyperlipidemia   . Headache(784.0)   . Labial cyst 04/08/2011    early infection use hotpcompresses    . Thyroid disease     goiter  . STD (sexually transmitted disease)     Pos. Chlamydia--in her 75's  . Goiter   . Irritable bowel syndrome    Past Surgical History  Procedure Laterality Date  . Abdominal surgery    . Oophrectomy    . Tubal ligation    . Unilateral salpingectomy      right  age 21 ectopics.  . Cesarean section  2002, 2004    x2  . Pelvic laparoscopy      x7   Family History  Problem Relation Age of Onset  . Anxiety disorder Mother     mother is on  xanax and apparently applying for  social security diabilty for her anxiety. t  . Depression Mother   . Diabetes Mother   . Hypertension Mother   . Coronary artery disease    . Diabetes    . Hyperlipidemia    . Other      history of child death in family   . Heart attack Father   . Seizures Father   . Stroke Maternal Grandmother   . Diabetes Paternal Grandmother   . Hypertension Paternal Grandmother   . Colon cancer Neg Hx   . Stomach cancer Neg Hx   . Esophageal cancer Neg Hx    History  Substance Use Topics  . Smoking status: Never Smoker   . Smokeless tobacco: Never Used  . Alcohol Use: 0.6 oz/week    1 Standard drinks or equivalent per week     Comment: occ   OB History    Gravida Para Term Preterm AB TAB SAB Ectopic Multiple Living   7 2   5 1 1 3  2       Obstetric Comments   3 ectopic     Review of Systems  All other systems reviewed and are negative.  Allergies  Codeine phosphate; Moxifloxacin; and Sulfamethoxazole  Home Medications   Prior to Admission medications   Medication Sig Start Date End Date Taking? Authorizing Provider  Aspirin-Caffeine (BC FAST PAIN RELIEF) 845-65 MG PACK Take 65 mg by mouth every 8 (eight) hours as needed. Headache   Yes Historical Provider, MD  citalopram (CELEXA) 20 MG tablet Take 1 tablet (20 mg total) by mouth daily. 01/17/14  Yes Burnis Medin, MD  fluticasone (FLONASE) 50 MCG/ACT nasal spray Place 2 sprays into both nostrils daily. Patient taking differently: Place 2 sprays into both nostrils daily as needed for allergies.  01/17/14  Yes Burnis Medin, MD  ibuprofen (ADVIL,MOTRIN) 200 MG tablet Take 1,000 mg by mouth every 6 (six) hours as needed for moderate pain (headache).    Yes Historical Provider, MD  loratadine (CLARITIN) 10 MG tablet Take 10 mg by mouth daily as needed for allergies.    Yes Historical Provider, MD  norethindrone (MICRONOR,CAMILA,ERRIN) 0.35 MG tablet Take 1 tablet (0.35 mg total) by mouth  daily. 05/08/14  Yes Brook E Yisroel Ramming, MD  pantoprazole (PROTONIX) 40 MG tablet Take 1 tablet (40 mg total) by mouth daily. 02/20/14  Yes Jessica D Zehr, PA-C  predniSONE (DELTASONE) 10 MG tablet Take pills per day,6,6,6,4,4,4,2,2,2,1,1,1 07/05/14  Yes Burnis Medin, MD  promethazine (PHENERGAN) 25 MG tablet Take 1 tablet (25 mg total) by mouth every 8 (eight) hours as needed for nausea or vomiting. 07/05/14  Yes Burnis Medin, MD  triamterene-hydrochlorothiazide (MAXZIDE-25) 37.5-25 MG per tablet TAKE 1 TABLET BY MOUTH DAILY 05/22/14  Yes Burnis Medin, MD  butalbital-aspirin-caffeine [facility]) [phone] MG per capsule Take 1 capsule by mouth every 6 (six) hours as needed for headache. 07/16/14   Dorie Rank, MD   BP 117/50 mmHg  Pulse 76  Temp(Src) 98.2 F (36.8 C) (Oral)  Resp 18  Ht 5\' 6"  (1.676 m)  Wt 246 lb (111.585 kg)  BMI 39.72 kg/m2  SpO2 100%  LMP 07/02/2014 Physical Exam  Constitutional: She appears well-developed and well-nourished. No distress.  HENT:  Head: Normocephalic and atraumatic.  Right Ear: External ear normal.  Left Ear: External ear normal.  Eyes: Conjunctivae are normal. Right eye exhibits no discharge. Left eye exhibits no discharge. No scleral icterus.  Neck: Neck supple. No tracheal deviation present.  Cardiovascular: Normal rate, regular rhythm and intact distal pulses.   Pulmonary/Chest: Effort normal and breath sounds normal. No stridor. No respiratory distress. She has no wheezes. She has no rales.  Abdominal: Soft. Bowel sounds are normal. She exhibits no distension. There is no tenderness. There is no rebound and no guarding.  Musculoskeletal: She exhibits no edema or tenderness.  Neurological: She is alert. She has normal strength. No cranial nerve deficit (no facial droop, extraocular movements intact, no slurred speech) or sensory deficit. She exhibits normal muscle tone. She displays no seizure activity. Coordination normal.  No facial droop,  extraocular movements are intact, equal grip strength and plantar flexion strength bilaterally, normal sensation, tongue is midline  Skin: Skin is warm and dry. No rash noted.  Psychiatric: She has a normal mood and affect.  Nursing note and vitals reviewed.   ED Course  Procedures (including critical care time) Labs Review Labs Reviewed  CBC - Abnormal; Notable for the following:    WBC 17.0 (*)    Platelets 467 (*)    All other components within normal limits  BASIC METABOLIC PANEL - Abnormal; Notable for the following:  Chloride 99 (*)    Creatinine, Ser 1.16 (*)    Calcium 8.8 (*)    GFR calc non Af Amer 57 (*)    All other components within normal limits    Medications  prochlorperazine (COMPAZINE) injection 10 mg (10 mg Intravenous Given 07/16/14 1929)  diphenhydrAMINE (BENADRYL) injection 25 mg (25 mg Intravenous Given 07/16/14 1928)  HYDROmorphone (DILAUDID) injection 1 mg (1 mg Intravenous Given 07/16/14 1929)     MDM   Final diagnoses:  Migraine with status migrainosus, not intractable, unspecified migraine type   Pt's symptoms improved with treatment.  She is feeling much better now.  She says she hasn't felt this good in weeks.  Sx consistent with her recurrent migraine headaches.    Follow up with neurology as planned    Dorie Rank, MD 07/16/14 2030

## 2014-07-16 NOTE — ED Notes (Signed)
Pt has a hx of migraines and states that it has been 26 days now since she has had the current one. Has seen PCP and toradol injection and prednisone did not work. Has appt with neurologist on 7/15. Alert and oriented.

## 2014-07-16 NOTE — ED Notes (Signed)
MD at bedside. EDP KNAPP PRESENT 

## 2014-07-16 NOTE — Discharge Instructions (Signed)

## 2014-08-09 ENCOUNTER — Ambulatory Visit (INDEPENDENT_AMBULATORY_CARE_PROVIDER_SITE_OTHER): Payer: BLUE CROSS/BLUE SHIELD | Admitting: Neurology

## 2014-08-09 ENCOUNTER — Encounter: Payer: Self-pay | Admitting: Neurology

## 2014-08-09 VITALS — BP 128/72 | HR 84 | Ht 66.0 in | Wt 253.0 lb

## 2014-08-09 DIAGNOSIS — G43419 Hemiplegic migraine, intractable, without status migrainosus: Secondary | ICD-10-CM | POA: Diagnosis not present

## 2014-08-09 MED ORDER — ATENOLOL 50 MG PO TABS: ORAL_TABLET | ORAL | Status: DC

## 2014-08-09 MED ORDER — DIAZEPAM 5 MG PO TABS: ORAL_TABLET | ORAL | Status: DC

## 2014-08-09 NOTE — Patient Instructions (Addendum)
Migraine Recommendations: 1.  Start atenolol 50mg  tablets.  Take 1/2 tablet daily for 7 days, then increase to 1 full tablet daily.  Call in 4 weeks with update and we can adjust dose if needed. 2.  Take ibuprofen or Excedrin at earliest onset of headache.  May repeat dose once in 2 hours if needed.  3.  Limit use of pain relievers to no more than 2 days out of the week.  These medications include acetaminophen, ibuprofen, triptans and narcotics.  This will help reduce risk of rebound headaches. 4.  Be aware of common food triggers such as processed sweets, processed foods with nitrites (such as deli meat, hot dogs, sausages), foods with MSG, alcohol (such as wine), chocolate, certain cheeses, certain fruits (dried fruits, some citrus fruit), vinegar, diet soda. 4.  Avoid caffeine 5.  Routine exercise 6.  Proper sleep hygiene 7.  Stay adequately hydrated with water 8.  Keep a headache diary. 9.  Maintain proper stress management. 10.  Do not skip meals. 11.  We will get MRI of brain.  You may take Valium 5mg  15-30 minutes prior to imaging if needed.  You will need somebody to drive you home. We have scheduled you at [facility] for your OPEN MRI on 08/16/2014 at 10:00 am. Please arrive 30 minutes prior and go to [place]. If you need to reschedule for any reason please call [phone]. 12.  Follow up in 3 months BUT CALL IN 4 WEEKS WITH UPDATE.

## 2014-08-09 NOTE — Progress Notes (Addendum)
NEUROLOGY CONSULTATION NOTE  MICHE LOUGHRIDGE MRN: [id number] DOB: 05-19-1970  Referring provider: Dr. Regis Bill Primary care provider: Dr. Regis Bill  Reason for consult:  migraine  HISTORY OF PRESENT ILLNESS: [name] is a 44 year old left-handed woman with migraines and anxiety who presents for atypical migraine.  Images of MRI and MRA of head and neck from 2010 and labs reviewed.  Onset:  Classic migraines started age 16.  Current atypical migraines started in 2010 Location: bi-frontal Quality:  pressure Intensity:  dull Aura:  Word-finding problems, slowed speech Prodrome:  none Associated symptoms:  Nausea, pain and numbness involving the left side of face, arm and leg.  Associated weakness/heaviness of the left arm and leg. Duration:  8-10 days Frequency:  She always has some degree of pain in the left side but with fluctuations in weakness, pain and speech difficulty  Classic migraines are pounding bi-frontal headaches associated with nausea and vomiting.  She hasn't had those since around 2010.  Past abortive medication:  Imitrex, Zomig Past preventative medication:  Amitriptyline, topiramate, Depakote  Current abortive medication:  Ibuprofen, BC Current preventative medication:  none Other medication:  Celexa  She was previously worked up in August 2010.  MRI of brain with and without contrast was normal.  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    Caffeine:  Just stopped Smoker:  no Exercise:  Not routine Depression/stress:  stress Sleep hygiene:  poor Family history of headache:  Father, siblings, children  PAST MEDICAL HISTORY: Past Medical History  Diagnosis Date  . Anxiety   . Chest pain     neg stress test summer 2010  . Migraines   . Endometriosis   . Sinusitis   . Ovarian torsion   . Allergic rhinitis     hx of testing and allergic to spring and fall  f= pollens  . Anemia     nos  . GERD (gastroesophageal reflux disease)   . Endometriosis   . Hyperlipidemia   . Headache(784.0)   . Labial cyst 04/08/2011    early infection use hotpcompresses    . Thyroid disease     goiter  . STD (sexually transmitted disease)     Pos. Chlamydia--in her 77's  . Goiter   . Irritable bowel syndrome   . Fibroids     PAST SURGICAL HISTORY: Past Surgical History  Procedure Laterality Date  . Tubal ligation    . Unilateral salpingectomy      right  age 60 ectopics.  . Cesarean section  2002, 2004    x2  . Pelvic laparoscopy      x9    MEDICATIONS: Current Outpatient Prescriptions on File Prior to Visit  Medication Sig Dispense Refill  . butalbital-aspirin-caffeine (FIORINAL) 50-325-40 MG per capsule Take 1 capsule by mouth every 6 (six) hours as needed for headache. 16 capsule 0  . citalopram (CELEXA) 20 MG tablet Take 1 tablet (20 mg total) by mouth daily. 90 tablet 1  . fluticasone (FLONASE) 50 MCG/ACT nasal spray Place 2 sprays into both nostrils daily. (Patient taking differently: Place 2 sprays into both nostrils daily as needed for allergies. ) 1 g prn  . ibuprofen (ADVIL,MOTRIN) 200 MG tablet Take 1,000 mg by mouth every 6 (six) hours as needed for moderate pain (headache).     . loratadine (CLARITIN) 10 MG tablet Take 10 mg by mouth daily as needed for allergies.     Marland Kitchen  norethindrone (MICRONOR,CAMILA,ERRIN) 0.35 MG tablet Take 1 tablet (0.35 mg total) by mouth daily. 3 Package 1  . pantoprazole (PROTONIX) 40 MG tablet Take 1 tablet (40 mg total) by mouth daily. 90 tablet 3  . triamterene-hydrochlorothiazide (MAXZIDE-25) 37.5-25 MG per tablet TAKE 1 TABLET BY MOUTH DAILY 30 tablet 3   No current facility-administered medications on file prior to visit.    ALLERGIES: Allergies  Allergen Reactions  . Codeine Phosphate     REACTION: unspecified  . Moxifloxacin     REACTION: orickly sensation, joint pain  . Sulfamethoxazole      REACTION: unspecified    FAMILY HISTORY: Family History  Problem Relation Age of Onset  . Anxiety disorder Mother     mother is on xanax and apparently applying for  social security diabilty for her anxiety. t  . Depression Mother   . Diabetes Mother   . Hypertension Mother   . Coronary artery disease    . Diabetes    . Hyperlipidemia    . Other      history of child death in family   . Heart attack Father   . Seizures Father   . Stroke Maternal Grandmother   . Diabetes Paternal Grandmother   . Hypertension Paternal Grandmother   . Colon cancer Neg Hx   . Stomach cancer Neg Hx   . Esophageal cancer Neg Hx     SOCIAL HISTORY: History   Social History  . Marital Status: Single    Spouse Name: N/A  . Number of Children: N/A  . Years of Education: N/A   Occupational History  . Not on file.   Social History Main Topics  . Smoking status: Never Smoker   . Smokeless tobacco: Never Used  . Alcohol Use: 0.6 oz/week    1 Standard drinks or equivalent per week     Comment: occ  . Drug Use: No  . Sexual Activity:    Partners: Male    Birth Control/ Protection: Surgical     Comment: Tubal   Other Topics Concern  . 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   And graduated as SW.      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 in day    REVIEW OF SYSTEMS: Constitutional: No fevers, chills, or sweats, no generalized fatigue, change in appetite Eyes: No visual changes, double vision, eye pain Ear, nose and throat: No hearing loss, ear pain, nasal  congestion, sore throat Cardiovascular: No chest pain, palpitations Respiratory:  No shortness of breath at rest or with exertion, wheezes GastrointestinaI: No nausea, vomiting, diarrhea, abdominal pain, fecal incontinence Genitourinary:  No dysuria, urinary retention or frequency Musculoskeletal:  No neck pain, back pain Integumentary: No rash, pruritus, skin lesions Neurological: as above Psychiatric: No depression, insomnia, anxiety Endocrine: No palpitations, fatigue, diaphoresis, mood swings, change in appetite, change in weight, increased thirst Hematologic/Lymphatic:  No anemia, purpura, petechiae. Allergic/Immunologic:  no itchy/runny eyes, nasal congestion, recent allergic reactions, rashes  PHYSICAL EXAM: Filed Vitals:   08/09/14 1235  BP: 128/72  Pulse: 84   General: No acute distress.  Patient appears well-groomed.   Head:  Normocephalic/atraumatic Eyes:  fundi unremarkable, without vessel changes, exudates, hemorrhages or papilledema. Neck: supple, no paraspinal tenderness, full range of motion Back: No paraspinal tenderness Heart: regular rate and rhythm Lungs: Clear to auscultation bilaterally. Vascular: No carotid bruits. Neurological Exam: Mental status: alert and oriented to person, place, and time, recent and remote memory intact, fund of knowledge intact, attention and concentration intact, speech fluent and not dysarthric, language intact. Cranial nerves: CN I: not tested CN II: pupils equal, round and reactive to light, visual fields intact, fundi unremarkable, without vessel changes, exudates, hemorrhages or papilledema. CN III, IV, VI:  full range of motion, no nystagmus, no ptosis CN V: facial sensation intact CN VII: upper and lower face symmetric CN VIII: hearing intact CN IX, X: gag intact, uvula midline CN XI: sternocleidomastoid and trapezius muscles intact CN XII: tongue midline Bulk & Tone: normal, no fasciculations. Motor:  5/5  throughout Sensation:  Temperature and vibration intact Deep Tendon Reflexes:  2+ throughout, toes downgoing Finger to nose testing:  No dysmetria Heel to shin:  No dysmetria Gait:  Normal station and stride.  Able to turn and walk in tandem. Romberg negative.  IMPRESSION: Episodic left sided weakness and speech disturbance, associated with left sided pain.  She has dull pressure-like sensation in head.  Previously diagnosed in 2010 by her prior neurologist with a complicated migraine.  If these are migraines, they would be classified as hemiplegic migraine.  The fact that she always has a persistent sensation of pain is unusual.  PLAN: 1.  Due to the persistent left sided pain and heaviness, will repeat MRI of brain without contrast 2.  If these are migraines, then we will try treating with atenolol 25mg  daily for 7 days, increasing to 50mg  daily.  She is to call in 4 weeks with update. 3.  If these are hemiplegic migraines, then triptans and DHE are contraindication 4.  Weight loss 5.  Follow up in 3 months.  Thank you for allowing me to take part in the care of this patient.  Metta Clines, DO  CC:  [name], MD

## 2014-08-13 ENCOUNTER — Telehealth: Payer: Self-pay | Admitting: *Deleted

## 2014-08-13 NOTE — Telephone Encounter (Signed)
Patient returning your call she states that the pain is as it was on Friday  Call back number [phone]

## 2014-08-13 NOTE — Telephone Encounter (Signed)
We can give her phenergan 12.5mg  every 6 hours as needed for nausea (30 tablets).  Does she need to come in for a migraine cocktail?

## 2014-08-13 NOTE — Telephone Encounter (Signed)
Patient has picked up her medication for nausea at pharmacy

## 2014-08-13 NOTE — Telephone Encounter (Signed)
Patient has picked up Phenergan and wishes to try that before moving to the injections for the headache

## 2014-08-13 NOTE — Telephone Encounter (Signed)
Patient calling stating she has headache with nausea for 2 days and is asking for something for the nausea  Please advise

## 2014-08-16 ENCOUNTER — Other Ambulatory Visit: Payer: Self-pay | Admitting: *Deleted

## 2014-08-16 ENCOUNTER — Telehealth: Payer: Self-pay | Admitting: *Deleted

## 2014-08-16 DIAGNOSIS — G43409 Hemiplegic migraine, not intractable, without status migrainosus: Secondary | ICD-10-CM

## 2014-08-16 NOTE — Telephone Encounter (Signed)
Patient was unable to complete her Mri today she had a panic attack please advise Call back number [phone]

## 2014-08-16 NOTE — Telephone Encounter (Signed)
Patient unable to complete MRI this was scheduled at [facility] .

## 2014-08-19 ENCOUNTER — Encounter: Payer: Self-pay | Admitting: Obstetrics and Gynecology

## 2014-08-27 ENCOUNTER — Other Ambulatory Visit: Payer: Self-pay | Admitting: Internal Medicine

## 2014-08-27 NOTE — Telephone Encounter (Signed)
Sent to the pharmacy by e-scribe. 

## 2014-09-04 ENCOUNTER — Telehealth: Payer: Self-pay | Admitting: *Deleted

## 2014-09-04 ENCOUNTER — Ambulatory Visit: Payer: BLUE CROSS/BLUE SHIELD | Admitting: Neurology

## 2014-09-04 ENCOUNTER — Encounter (HOSPITAL_COMMUNITY): Payer: Self-pay

## 2014-09-04 ENCOUNTER — Encounter (HOSPITAL_COMMUNITY)
Admission: RE | Admit: 2014-09-04 | Discharge: 2014-09-04 | Disposition: A | Discharge status: Home / Self Care | Payer: BLUE CROSS/BLUE SHIELD | Source: Ambulatory Visit | Attending: Neurology | Admitting: Neurology

## 2014-09-04 DIAGNOSIS — G43409 Hemiplegic migraine, not intractable, without status migrainosus: Secondary | ICD-10-CM | POA: Diagnosis present

## 2014-09-04 LAB — BASIC METABOLIC PANEL
ANION GAP: 11 (ref 5–15)
BUN: 9 mg/dL (ref 6–20)
CALCIUM: 9.1 mg/dL (ref 8.9–10.3)
CO2: 22 mmol/L (ref 22–32)
Chloride: 104 mmol/L (ref 101–111)
Creatinine, Ser: 1.15 mg/dL — ABNORMAL HIGH (ref 0.44–1.00)
GFR calc Af Amer: >60 mL/min (ref >60)
GFR, EST NON AFRICAN AMERICAN: 57 mL/min — AB (ref >60)
Glucose, Bld: 98 mg/dL (ref 65–99)
Potassium: 3.8 mmol/L (ref 3.5–5.1)
SODIUM: 137 mmol/L (ref 135–145)

## 2014-09-04 LAB — CBC
HCT: 38.6 % (ref 36.0–46.0)
HEMOGLOBIN: 12.9 g/dL (ref 12.0–15.0)
MCH: 30.4 pg (ref 26.0–34.0)
MCHC: 33.4 g/dL (ref 30.0–36.0)
MCV: 91 fL (ref 78.0–100.0)
Platelets: 409 10*3/uL — ABNORMAL HIGH (ref 150–400)
RBC: 4.24 MIL/uL (ref 3.87–5.11)
RDW: 13.9 % (ref 11.5–15.5)
WBC: 10.9 10*3/uL — ABNORMAL HIGH (ref 4.0–10.5)

## 2014-09-04 LAB — HCG, SERUM, QUALITATIVE: Preg, Serum: NEGATIVE

## 2014-09-04 NOTE — Pre-Procedure Instructions (Signed)
BRIGITTA PRICER  09/04/2014      [id number] - [place], [address] AT [facility] [address] [place] [address] Phone: [phone] Fax: [phone]    Your procedure is scheduled on August 11th, Thursday.   Report to [facility] Admitting at 8:15AM  Call this number if you have problems the morning of surgery:  [phone]   Remember:  Do not eat food or drink liquids after midnight Wednesday.  Take these medicines the morning of surgery with A SIP OF WATER : Atenolol, Celexa, Flonase, Protonix   Do not wear jewelry, make-up or nail polish.  Do not wear lotions, powders, or perfumes.   Do not shave 48 hours prior to surgery.    Do not bring valuables to the hospital.  [facility] is not responsible for any belongings or valuables.  Contacts, dentures or bridgework may not be worn into surgery.  Leave your suitcase in the car.  After surgery it may be brought to your room. For patients admitted to the hospital, discharge time will be determined by your treatment team.  Patients discharged the day of surgery will not be allowed to drive home.   Name and phone number of your driver:   Rosario Adie  -- friend  Please read over the following fact sheets that you were given. Pain Booklet and Surgical Site Infection Prevention

## 2014-09-04 NOTE — Telephone Encounter (Signed)
These 2 test are required for her sedation for MRI on 09/05/14

## 2014-09-04 NOTE — Progress Notes (Addendum)
H & P  Done on July 2016 by Dr Loretta Plume. (inside Chart)  Dr. Idell Pickles is PCP Patient saw Dr. Lizbeth Bark 6-8 yrs ago for palpitations.  No other tests done on her heart.  Has not been back.

## 2014-09-04 NOTE — Telephone Encounter (Signed)
-----   Message from Pieter Partridge, DO sent at 09/04/2014  3:26 PM EDT -----   ----- Message -----    From: Lab in [facility] Interface    Sent: 09/04/2014   3:01 PM      To: Pieter Partridge, DO

## 2014-09-05 ENCOUNTER — Ambulatory Visit (HOSPITAL_COMMUNITY): Payer: BLUE CROSS/BLUE SHIELD | Admitting: Anesthesiology

## 2014-09-05 ENCOUNTER — Encounter (HOSPITAL_COMMUNITY)
Admission: RE | Disposition: A | Discharge status: Home / Self Care | Payer: Self-pay | Source: Ambulatory Visit | Attending: Neurology

## 2014-09-05 ENCOUNTER — Ambulatory Visit (HOSPITAL_COMMUNITY)
Admission: RE | Admit: 2014-09-05 | Discharge: 2014-09-05 | Disposition: A | Discharge status: Home / Self Care | Payer: BLUE CROSS/BLUE SHIELD | Source: Ambulatory Visit | Attending: Neurology | Admitting: Neurology

## 2014-09-05 ENCOUNTER — Encounter (HOSPITAL_COMMUNITY): Payer: Self-pay | Admitting: Certified Registered Nurse Anesthetist

## 2014-09-05 DIAGNOSIS — G43409 Hemiplegic migraine, not intractable, without status migrainosus: Secondary | ICD-10-CM | POA: Insufficient documentation

## 2014-09-05 HISTORY — PX: RADIOLOGY WITH ANESTHESIA: SHX6223

## 2014-09-05 SURGERY — RADIOLOGY WITH ANESTHESIA
Anesthesia: General

## 2014-09-05 MED ORDER — LACTATED RINGERS IV SOLN
INTRAVENOUS | Status: DC
  Administered 2014-09-05: 09:00:00 via INTRAVENOUS

## 2014-09-05 MED ORDER — ONDANSETRON HCL 4 MG/2ML IJ SOLN
4.0000 mg | Freq: Once | INTRAMUSCULAR | Status: AC
  Administered 2014-09-05: 4 mg via INTRAVENOUS

## 2014-09-05 MED ORDER — ONDANSETRON HCL 4 MG/2ML IJ SOLN
INTRAMUSCULAR | Status: AC
  Administered 2014-09-05: 4 mg via INTRAVENOUS
  Filled 2014-09-05: qty 2

## 2014-09-05 NOTE — Anesthesia Preprocedure Evaluation (Signed)
Anesthesia Evaluation  Patient identified by MRN, date of birth, ID band Patient awake    Reviewed: Allergy & Precautions, NPO status , Patient's Chart, lab work & pertinent test results  Airway Mallampati: II  TM Distance: >3 FB Neck ROM: Full    Dental  (+) Teeth Intact, Dental Advisory Given   Pulmonary neg pulmonary ROS, shortness of breath,  Shortness of breath with anxiety breath sounds clear to auscultation  Pulmonary exam normal       Cardiovascular Exercise Tolerance: Good negative cardio ROS Normal cardiovascular examRhythm:Regular Rate:Normal     Neuro/Psych  Headaches, Anxiety negative neurological ROS     GI/Hepatic negative GI ROS, Neg liver ROS, GERD-  Medicated and Controlled,IBS   Endo/Other  negative endocrine ROS  Renal/GU negative Renal ROS     Musculoskeletal negative musculoskeletal ROS (+)   Abdominal   Peds  Hematology  (+) anemia ,   Anesthesia Other Findings Day of surgery medications reviewed with the patient.  Reproductive/Obstetrics                             Anesthesia Physical Anesthesia Plan  ASA: II  Anesthesia Plan: General   Post-op Pain Management:    Induction: Intravenous  Airway Management Planned: LMA  Additional Equipment:   Intra-op Plan:   Post-operative Plan: Extubation in OR  Informed Consent: I have reviewed the patients History and Physical, chart, labs and discussed the procedure including the risks, benefits and alternatives for the proposed anesthesia with the patient or authorized representative who has indicated his/her understanding and acceptance.   Dental advisory given  Plan Discussed with: CRNA  Anesthesia Plan Comments: (Risks/benefits of general anesthesia discussed with patient including risk of damage to teeth, lips, gum, and tongue, nausea/vomiting, allergic reactions to medications, and the possibility of  heart attack, stroke and death.  All patient questions answered.  Patient wishes to proceed.)        Anesthesia Quick Evaluation

## 2014-09-05 NOTE — Anesthesia Postprocedure Evaluation (Signed)
  Anesthesia Post-op Note  Patient: [name]  Procedure(s) Performed: Procedure(s) with comments: MRI OF BRAIN WITHOUT CONTRAST (N/A) - MRI/DR. JAFFE  Patient Location: PACU  Anesthesia Type:General  Level of Consciousness: awake, alert  and oriented  Airway and Oxygen Therapy: Patient Spontanous Breathing  Post-op Pain: none  Post-op Assessment: Post-op Vital signs reviewed, Patient's Cardiovascular Status Stable, Respiratory Function Stable, Patent Airway, No signs of Nausea or vomiting and Pain level controlled              Post-op Vital Signs: Reviewed  Last Vitals:  Filed Vitals:   09/05/14 1224  BP: 133/73  Pulse: 76  Temp:   Resp: 16    Complications: No apparent anesthesia complications

## 2014-09-05 NOTE — Anesthesia Procedure Notes (Signed)
Procedure Name: Intubation Date/Time: 09/05/2014 12:19 PM Performed by: Merdis Delay Pre-anesthesia Checklist: Patient identified, Emergency Drugs available, Suction available, Patient being monitored and Timeout performed Patient Re-evaluated:Patient Re-evaluated prior to inductionOxygen Delivery Method: Circle system utilized Preoxygenation: Pre-oxygenation with 100% oxygen Intubation Type: IV induction LMA: LMA inserted LMA Size: 4.0 Number of attempts: 1 Placement Confirmation: positive ETCO2,  CO2 detector and breath sounds checked- equal and bilateral Tube secured with: Tape Dental Injury: Teeth and Oropharynx as per pre-operative assessment

## 2014-09-05 NOTE — Transfer of Care (Signed)
Immediate Anesthesia Transfer of Care Note  Patient: [name]  Procedure(s) Performed: Procedure(s) with comments: MRI OF BRAIN WITHOUT CONTRAST (N/A) - MRI/DR. JAFFE  Patient Location: PACU  Anesthesia Type:General  Level of Consciousness: awake, alert  and oriented  Airway & Oxygen Therapy: Patient Spontanous Breathing  Post-op Assessment: Report given to RN and Post -op Vital signs reviewed and stable  Post vital signs: Reviewed and stable  Last Vitals:  Filed Vitals:   09/05/14 0834  BP: 148/70  Pulse: 72  Temp: 36.7 C  Resp: 20    Complications: No apparent anesthesia complications

## 2014-09-06 ENCOUNTER — Encounter (HOSPITAL_COMMUNITY): Payer: Self-pay | Admitting: Radiology

## 2014-09-10 MED FILL — Propofol IV Emul 200 MG/20ML (10 MG/ML): INTRAVENOUS | Qty: 20 | Status: AC

## 2014-09-10 MED FILL — Midazolam HCl Inj 2 MG/2ML (Base Equivalent): INTRAMUSCULAR | Qty: 2 | Status: AC

## 2014-09-10 MED FILL — Phenylephrine-NaCl Pref Syr 0.4 MG/10ML-0.9% (40 MCG/ML): INTRAVENOUS | Qty: 10 | Status: AC

## 2014-09-10 MED FILL — Fentanyl Citrate Preservative Free (PF) Inj 100 MCG/2ML: INTRAMUSCULAR | Qty: 2 | Status: AC

## 2014-09-10 MED FILL — Lidocaine HCl IV Inj 20 MG/ML: INTRAVENOUS | Qty: 5 | Status: AC

## 2014-09-10 MED FILL — Ondansetron HCl Inj 4 MG/2ML (2 MG/ML): INTRAMUSCULAR | Qty: 2 | Status: AC

## 2014-09-11 ENCOUNTER — Inpatient Hospital Stay (HOSPITAL_COMMUNITY)
Admission: AD | Admit: 2014-09-11 | Discharge: 2014-09-11 | Disposition: A | Discharge status: Home / Self Care | Payer: BLUE CROSS/BLUE SHIELD | Source: Ambulatory Visit | Attending: Gynecology | Admitting: Gynecology

## 2014-09-11 ENCOUNTER — Encounter (HOSPITAL_COMMUNITY): Payer: Self-pay | Admitting: *Deleted

## 2014-09-11 ENCOUNTER — Telehealth: Payer: Self-pay | Admitting: Obstetrics and Gynecology

## 2014-09-11 ENCOUNTER — Telehealth: Payer: Self-pay | Admitting: *Deleted

## 2014-09-11 DIAGNOSIS — N939 Abnormal uterine and vaginal bleeding, unspecified: Secondary | ICD-10-CM

## 2014-09-11 DIAGNOSIS — K219 Gastro-esophageal reflux disease without esophagitis: Secondary | ICD-10-CM | POA: Diagnosis not present

## 2014-09-11 DIAGNOSIS — E785 Hyperlipidemia, unspecified: Secondary | ICD-10-CM | POA: Insufficient documentation

## 2014-09-11 DIAGNOSIS — K589 Irritable bowel syndrome without diarrhea: Secondary | ICD-10-CM | POA: Diagnosis not present

## 2014-09-11 DIAGNOSIS — R1030 Lower abdominal pain, unspecified: Secondary | ICD-10-CM | POA: Diagnosis not present

## 2014-09-11 DIAGNOSIS — E049 Nontoxic goiter, unspecified: Secondary | ICD-10-CM | POA: Diagnosis not present

## 2014-09-11 DIAGNOSIS — D259 Leiomyoma of uterus, unspecified: Secondary | ICD-10-CM | POA: Diagnosis not present

## 2014-09-11 DIAGNOSIS — N8351 Torsion of ovary and ovarian pedicle: Secondary | ICD-10-CM | POA: Diagnosis not present

## 2014-09-11 DIAGNOSIS — F419 Anxiety disorder, unspecified: Secondary | ICD-10-CM | POA: Diagnosis not present

## 2014-09-11 DIAGNOSIS — D649 Anemia, unspecified: Secondary | ICD-10-CM | POA: Diagnosis not present

## 2014-09-11 DIAGNOSIS — A64 Unspecified sexually transmitted disease: Secondary | ICD-10-CM | POA: Diagnosis not present

## 2014-09-11 DIAGNOSIS — N809 Endometriosis, unspecified: Secondary | ICD-10-CM | POA: Insufficient documentation

## 2014-09-11 HISTORY — DX: Essential (primary) hypertension: I10

## 2014-09-11 LAB — CBC
HCT: 36.7 % (ref 36.0–46.0)
Hemoglobin: 12.1 g/dL (ref 12.0–15.0)
MCH: 30 pg (ref 26.0–34.0)
MCHC: 33 g/dL (ref 30.0–36.0)
MCV: 91.1 fL (ref 78.0–100.0)
PLATELETS: 425 10*3/uL — AB (ref 150–400)
RBC: 4.03 MIL/uL (ref 3.87–5.11)
RDW: 14.1 % (ref 11.5–15.5)
WBC: 6.9 10*3/uL (ref 4.0–10.5)

## 2014-09-11 LAB — TYPE AND SCREEN
ABO/RH(D): A POS
Antibody Screen: NEGATIVE

## 2014-09-11 LAB — ABO/RH: ABO/RH(D): A POS

## 2014-09-11 MED ORDER — ATENOLOL 50 MG PO TABS: 50.0000 mg | ORAL_TABLET | Freq: Every day | ORAL | Status: DC

## 2014-09-11 MED ORDER — MEGESTROL ACETATE 20 MG PO TABS: 20.0000 mg | ORAL_TABLET | Freq: Two times a day (BID) | ORAL | Status: DC

## 2014-09-11 MED ORDER — MEGESTROL ACETATE 40 MG PO TABS
40.0000 mg | ORAL_TABLET | Freq: Once | ORAL | Status: AC
  Administered 2014-09-11: 40 mg via ORAL
  Filled 2014-09-11: qty 1

## 2014-09-11 NOTE — Telephone Encounter (Signed)
Spoke with patient at time of incoming call. She states that she started her cycle "super early" and that she is is having heavy bleeding that started on Monday 09/09/14. Today, she has changed her tampon 3 times in 3 hours. She is using a regular tampon. She states that bleeding is heavy and with movement feels leaking of blood down her legs. She states the bleeding that she is having has been "about the same" since it started on Monday, but that she has decreased activity so that bleeding is lessened with movement. Patient is at work now. She states that she feels weak, nauseated and feels as though she is having menstrual cramps.   Patient reports that she is sexually active and has a history of tubal ligation and is on progesterone only pills.   Requested patient to hold so I could discuss disposition with Dr. Quincy Simmonds.  Returned to line to request that I call patient back with disposition, patient states she does not want to wait and will go to [facility] hospital at this time. Patient states she has a ride to [facility] and feels safe to go in a car. Advised patient to go to closest ER. She verbalized understanding.

## 2014-09-11 NOTE — Telephone Encounter (Signed)
Patient is having abnormal bleeding. Chart to triage.

## 2014-09-11 NOTE — Telephone Encounter (Signed)
Thank you for the update!

## 2014-09-11 NOTE — Telephone Encounter (Signed)
-----   Message from Pieter Partridge, DO sent at 09/06/2014  7:24 AM EDT ----- MRI of brain normal

## 2014-09-11 NOTE — MAU Provider Note (Signed)
Chief Complaint: Vaginal Bleeding   First Provider Initiated Contact with Patient 09/11/14 1446     SUBJECTIVE HPI: LORENNA Bishop is a 44 y.o. [id number] female who presents to [facility] Admissions reporting heavy vaginal bleeding running down her legs and low abd cramping since 09/09/14. LMP 08/21/14 before that. Was put on POPs for irreg periods since 04/2014. Has been having regular cycles w/ moderate bleeding since then. Denies Hx heavy periods, but per office note she has. Hx fibroids, endometriosis. Last [place] 03/2014. Reports mild dizziness, but it is similar to what she normally experiences w/ migraines and is have a migraine now. Is under care of neuro. Normal MRI 09/05/14.  Neg UPT 09/04/14. S/P tubal ligation and salpingectomy.   Location: Suprapubic Quality: intermittent cramping Severity: None now, but 10/10 on pain scale when it occurs. Similar to menstrual cramps, but worse.  Duration: 3 days Context: none Timing: random, sporadic Modifying factors: Relieved by Advil Associated signs and symptoms: Dizziness may or may not be related  Past Medical History  Diagnosis Date  . Anxiety   . Chest pain     neg stress test summer 2010  . Migraines   . Endometriosis   . Sinusitis   . Ovarian torsion   . Allergic rhinitis     hx of testing and allergic to spring and fall f= pollens  . Anemia     nos  . GERD (gastroesophageal reflux disease)   . Endometriosis   . Hyperlipidemia   . Headache(784.0)   . Labial cyst 04/08/2011    early infection use hotpcompresses    . Thyroid disease     goiter  . STD (sexually transmitted disease)     Pos. Chlamydia--in her 14's  . Goiter   . Irritable bowel syndrome   . Fibroids    OB History  Gravida Para Term Preterm AB SAB TAB Ectopic Multiple Living  7 2   5 1 1 3  2     # Outcome Date GA Lbr Len/2nd Weight Sex Delivery Anes PTL Lv  7 SAB           6 TAB           5 Ectopic           4 Ectopic           3 Ectopic           2 Para            1 Para             Obstetric Comments  3 ectopic   Past Surgical History  Procedure Laterality Date  . Tubal ligation    . Unilateral salpingectomy      right  age 36 ectopics.  . Cesarean section  2002, 2004    x2  . Pelvic laparoscopy      x9  . Radiology with anesthesia N/A 09/05/2014    Procedure: MRI OF BRAIN WITHOUT CONTRAST;  Surgeon: Medication Radiologist, MD;  Location: [facility];  Service: Radiology;  Laterality: N/A;  MRI/DR. JAFFE   Social History   Social History  . Marital Status: Single    Spouse Name: N/A  . Number of Children: N/A  . Years of Education: N/A   Occupational History  . Not on file.   Social History Main Topics  . Smoking status: Never Smoker   . Smokeless tobacco: Never Used  . Alcohol Use: 0.6 oz/week    1 Standard drinks  or equivalent per week     Comment: occ  . Drug Use: No  . Sexual Activity:    Partners: Male    Birth Control/ Protection: Surgical     Comment: Tubal   Other Topics Concern  . 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   And graduated as SW.      Mom moved back home to [facility] and has stress with chid care       Now working as a Dance movement psychotherapist care daytime doing well   Social worker [facility] and comm care does visits in day   No current facility-administered medications on file prior to encounter.   Current Outpatient Prescriptions on File Prior to Encounter  Medication Sig Dispense Refill  . atenolol (TENORMIN) 50 MG tablet Take 0.5 tab daily for 7 days, then increase to 1 tab daily (Patient taking differently: Take 50 mg by mouth daily. ) 30 tablet 0  . citalopram (CELEXA) 20 MG tablet TAKE 1 TABLET BY MOUTH ONCE  DAILY 120 tablet 0  . ibuprofen (ADVIL,MOTRIN) 200 MG tablet Take 1,000 mg by mouth every 6 (six) hours as needed for moderate pain (headache).     . norethindrone (MICRONOR,CAMILA,ERRIN) 0.35 MG tablet Take 1 tablet (0.35 mg total) by mouth daily. 3 Package 1  . pantoprazole (PROTONIX) 40 MG tablet Take 1 tablet (40 mg total) by mouth daily. 90 tablet 3  . promethazine (PHENERGAN) 12.5 MG tablet Take 12.5 mg by mouth every 6 (six) hours as needed for nausea or vomiting.   1  . triamterene-hydrochlorothiazide (MAXZIDE-25) 37.5-25 MG per tablet TAKE 1 TABLET BY MOUTH DAILY 30 tablet 3  . butalbital-aspirin-caffeine (FIORINAL) 50-325-40 MG per capsule Take 1 capsule by mouth every 6 (six) hours as needed for headache. (Patient not taking: Reported on 09/02/2014) 16 capsule 0  . diazepam (VALIUM) 5 MG tablet Take 1 tablet 15-30 minutes prior to MRI (Patient not taking: Reported on 09/02/2014) 1 tablet 0  . fluticasone (FLONASE) 50 MCG/ACT nasal spray Place 2 sprays into both nostrils daily. (Patient taking differently: Place 2 sprays into both nostrils daily as needed for allergies. ) 1 g prn  . loratadine (CLARITIN) 10 MG tablet Take 10 mg by mouth daily as needed for allergies.      Allergies  Allergen Reactions  . Codeine Phosphate Hives  . Moxifloxacin     REACTION: cause strange sensation, joint pain. Could not get thumbs to move. Stiffness.  . Sulfamethoxazole Hives    I have reviewed the past Medical Hx, Surgical Hx, Social Hx, Allergies and Medications.   Review of Systems  Constitutional: Negative for fever and chills.  Gastrointestinal: Positive for abdominal pain and diarrhea (prior to start of vaginal bleeding. None since. ). Negative for nausea, vomiting and constipation.  Genitourinary: Positive for vaginal bleeding, menstrual problem and pelvic pain. Negative for dysuria, urgency, frequency, hematuria, flank pain, vaginal discharge and vaginal pain.  Musculoskeletal: Positive for  back pain (midline low back).  Skin: Negative for pallor.  Neurological: Positive for dizziness and headaches. Negative for weakness.    OBJECTIVE  Patient Vitals for the past 24 hrs:  BP Temp Temp src Pulse Resp Height Weight  09/11/14 1440 125/75 mmHg - - 76 - - -  09/11/14 1422 144/100 mmHg 98.4 F (36.9 C) Oral 87 18 5\' 6"  (1.676 m) 253 lb (114.76 kg)   Constitutional: Well-developed, well-nourished obese female in no acute distress.  Skin: No pallor Cardiovascular: normal rate Respiratory: normal rate and effort.  GI: Abd soft, non-tender. Pos BS x 4 MS: Extremities nontender, no edema, normal ROM. Low back NT.  Neurologic: Alert and oriented x 4. Normal speech and gait GU: Neg CVAT.   PELVIC EXAM: NEFG, moderate amount of bright red blood note. Cervix closed, anterior; uterus normal size, no adnexal tenderness or masses. No CMT.  LAB RESULTS Results for orders placed or performed during the hospital encounter of 09/11/14 (from the past 24 hour(s))  CBC     Status: Abnormal   Collection Time: 09/11/14  2:44 PM  Result Value Ref Range   WBC 6.9 4.0 - 10.5 K/uL   RBC 4.03 3.87 - 5.11 MIL/uL   Hemoglobin 12.1 12.0 - 15.0 g/dL   HCT 36.7 36.0 - 46.0 %   MCV 91.1 78.0 - 100.0 fL   MCH 30.0 26.0 - 34.0 pg   MCHC 33.0 30.0 - 36.0 g/dL   RDW 14.1 11.5 - 15.5 %   Platelets 425 (H) 150 - 400 K/uL  Type and screen     Status: None   Collection Time: 09/11/14  2:55 PM  Result Value Ref Range   ABO/RH(D) A POS    Antibody Screen NEG    Sample Expiration 09/14/2014     IMAGING NA   MAU COURSE CBC, Type and screen.  Megace  MDM 44 year-old female w/ episode of heavy bleeding. Bleeding, VS and Hgb stable in MAU. Had severe low abd cramping C/W dysmenorrhea that resolved w/ Advil. Refused GC/Chlamydia cultures, Wet prep. Low suspicion for PID due to absence of leukocytosis, fever or CMT. Discussed w/ Dr. Phineas Real (Dr. Quincy Simmonds Not available). Will give Megace 40 mg in MAU, then  20 BID until bleedign slows down, then 20 QD x 1 week.   ASSESSMENT No diagnosis found.  PLAN Discharge home in stable condition. Bleeding Precautions Advil 600 mg RTC x 48 hours. Rx Megace Follow-up Information    Follow up with Arloa Koh, MD On 09/19/2014.   Specialty:  Obstetrics and Gynecology   Why:  for routine gynecology appointment or sooner as needed if symptoms worsen   Contact information:   [place] [address] [phone]       Follow up with [facility].   Why:  As needed in emergencies   Contact information:   [address] [id number] [place] [phone]        Medication List    TAKE these medications        atenolol 50 MG tablet  Commonly known as:  TENORMIN  Take 1 tablet (50 mg total) by mouth daily.     BLACK COHOSH PO  Take 1 capsule by mouth daily.     butalbital-aspirin-caffeine 50-325-40 MG per capsule  Commonly known as:  FIORINAL  Take 1 capsule by mouth every 6 (six) hours as needed for headache.     citalopram 20 MG tablet  Commonly known as:  CELEXA  TAKE 1 TABLET BY MOUTH ONCE DAILY     diazepam  5 MG tablet  Commonly known as:  VALIUM  Take 1 tablet 15-30 minutes prior to MRI     fluticasone 50 MCG/ACT nasal spray  Commonly known as:  FLONASE  Place 2 sprays into both nostrils daily.     ibuprofen 200 MG tablet  Commonly known as:  ADVIL,MOTRIN  Take 1,000 mg by mouth every 6 (six) hours as needed for moderate pain (headache).     loratadine 10 MG tablet  Commonly known as:  CLARITIN  Take 10 mg by mouth daily as needed for allergies.     megestrol 20 MG tablet  Commonly known as:  MEGACE  Take 1 tablet (20 mg total) by mouth 2 (two) times daily.     multivitamin with minerals Tabs tablet  Take 1 tablet by mouth daily.     norethindrone 0.35 MG tablet  Commonly known as:  MICRONOR,CAMILA,ERRIN   Take 1 tablet (0.35 mg total) by mouth daily.     pantoprazole 40 MG tablet  Commonly known as:  PROTONIX  Take 1 tablet (40 mg total) by mouth daily.     promethazine 12.5 MG tablet  Commonly known as:  PHENERGAN  Take 12.5 mg by mouth every 6 (six) hours as needed for nausea or vomiting.     triamterene-hydrochlorothiazide 37.5-25 MG per tablet  Commonly known as:  MAXZIDE-25  TAKE 1 TABLET BY [place], [place] 09/11/2014  3:16 PM

## 2014-09-11 NOTE — MAU Note (Signed)
Patient was put on BCP by Dr. Quincy Simmonds a few months ago for irregular periods, has had migraine, saturated her clothes a few times today, lower abdominal cramping 7/10

## 2014-09-11 NOTE — Discharge Instructions (Signed)
Abnormal Uterine Bleeding Abnormal uterine bleeding can affect women at various stages in life, including teenagers, women in their reproductive years, pregnant women, and women who have reached menopause. Several kinds of uterine bleeding are considered abnormal, including:  Bleeding or spotting between periods.   Bleeding after sexual intercourse.   Bleeding that is heavier or more than normal.   Periods that last longer than usual.  Bleeding after menopause.  Many cases of abnormal uterine bleeding are minor and simple to treat, while others are more serious. Any type of abnormal bleeding should be evaluated by your health care provider. Treatment will depend on the cause of the bleeding. HOME CARE INSTRUCTIONS Monitor your condition for any changes. The following actions may help to alleviate any discomfort you are experiencing:  Avoid the use of tampons and douches as directed by your health care provider.  Change your pads frequently. You should get regular pelvic exams and Pap tests. Keep all follow-up appointments for diagnostic tests as directed by your health care provider.  SEEK MEDICAL CARE IF:   Your bleeding lasts more than 1 week.   You feel dizzy at times.  SEEK IMMEDIATE MEDICAL CARE IF:   You pass out.   You are changing pads every 15 to 30 minutes.   You have abdominal pain.  You have a fever.   You become sweaty or weak.   You are passing large blood clots from the vagina.   You start to feel nauseous and vomit. MAKE SURE YOU:   Understand these instructions.  Will watch your condition.  Will get help right away if you are not doing well or get worse. Document Released: 01/11/2005 Document Revised: 01/16/2013 Document Reviewed: 08/10/2012 [facility] Patient Information 2015 [place], [place]. This information is not intended to replace advice given to you by your health care provider. Make sure you discuss any questions you have with your  health care provider.  Uterine Fibroid A uterine fibroid is a growth (tumor) that occurs in your uterus. This type of tumor is not cancerous and does not spread out of the uterus. You can have one or many fibroids. Fibroids can vary in size, weight, and where they grow in the uterus. Some can become quite large. Most fibroids do not require medical treatment, but some can cause pain or heavy bleeding during and between periods. CAUSES  A fibroid is the result of a single uterine cell that keeps growing (unregulated), which is different than most cells in the human body. Most cells have a control mechanism that keeps them from reproducing without control.  SIGNS AND SYMPTOMS   Bleeding.  Pelvic pain and pressure.  Bladder problems due to the size of the fibroid.  Infertility and miscarriages depending on the size and location of the fibroid. DIAGNOSIS  Uterine fibroids are diagnosed through a physical exam. Your health care provider may feel the lumpy tumors during a pelvic exam. Ultrasonography may be done to get information regarding size, location, and number of tumors.  TREATMENT   Your health care provider may recommend watchful waiting. This involves getting the fibroid checked by your health care provider to see if it grows or shrinks.   Hormone treatment or an intrauterine device (IUD) may be prescribed.   Surgery may be needed to remove the fibroids (myomectomy) or the uterus (hysterectomy). This depends on your situation. When fibroids interfere with fertility and a woman wants to become pregnant, a health care provider may recommend having the fibroids removed.  [place] care depends on how you were treated. In general:   Keep all follow-up appointments with your health care provider.   Only take over-the-counter or prescription medicines as directed by your health care provider. If you were prescribed a hormone treatment, take the hormone medicines  exactly as directed. Do not take aspirin. It can cause bleeding.   Talk to your health care provider about taking iron pills.  If your periods are troublesome but not so heavy, lie down with your feet raised slightly above your heart. Place cold packs on your lower abdomen.   If your periods are heavy, write down the number of pads or tampons you use per month. Bring this information to your health care provider.   Include green vegetables in your diet.  SEEK IMMEDIATE MEDICAL CARE IF:  You have pelvic pain or cramps not controlled with medicines.   You have a sudden increase in pelvic pain.   You have an increase in bleeding between and during periods.   You have excessive periods and soak tampons or pads in a half hour or less.  You feel lightheaded or have fainting episodes. Document Released: 01/09/2000 Document Revised: 11/01/2012 Document Reviewed: 08/10/2012 [facility] Patient Information 2015 [place], [place]. This information is not intended to replace advice given to you by your health care provider. Make sure you discuss any questions you have with your health care provider.

## 2014-09-11 NOTE — Telephone Encounter (Signed)
Patient is aware MRI of brain normal

## 2014-09-12 NOTE — Telephone Encounter (Signed)
Stop the Micronor.  Patient does not need contraception due to her tubal ligation status. I will see her at her annual exam visit.

## 2014-09-12 NOTE — Telephone Encounter (Signed)
Called patient with message from Dr. Quincy Simmonds. Patient will DC Micronor and will follow up at annual exam or call back if symptoms continue or worsen.  Routing to provider for final review. Patient agreeable to disposition. Will close encounter.

## 2014-09-12 NOTE — Telephone Encounter (Signed)
Call to patient. She is at home and feeling improved, resting at home today. States "bleeding is better." Taking Megace as directed. She has an annual exam appointment scheduled with Dr. Quincy Simmonds for 09/19/14 and declines offer for earlier appointment.   She would like to know if she should continue with Micronor while she is taking Megace.   Advised patient to return call if bleeding increases or any concerns. Patient agreeable.

## 2014-09-12 NOTE — Telephone Encounter (Signed)
-----   Message from Nunzio Cobbs, MD sent at 09/12/2014  5:23 AM EDT ----- Regarding: Please call to set up follow up appointment Patient seen at [facility] yesterday for pelvic pain and bleeding.  Treated with Megace.  Please call her in follow up and make an appointment to see me.   Thank you,   [facility]

## 2014-09-13 ENCOUNTER — Encounter: Payer: Self-pay | Admitting: Obstetrics and Gynecology

## 2014-09-17 ENCOUNTER — Telehealth: Payer: Self-pay | Admitting: *Deleted

## 2014-09-17 NOTE — Telephone Encounter (Signed)
Fyi: Atenolol is working patient feeling a lot better headache not completely gone but better. Patient also requesting a refill Call back number [phone]

## 2014-09-18 ENCOUNTER — Other Ambulatory Visit: Payer: Self-pay | Admitting: *Deleted

## 2014-09-18 MED ORDER — ATENOLOL 50 MG PO TABS: 50.0000 mg | ORAL_TABLET | Freq: Every day | ORAL | Status: DC

## 2014-09-19 ENCOUNTER — Ambulatory Visit: Payer: No Typology Code available for payment source | Admitting: Obstetrics and Gynecology

## 2014-09-19 ENCOUNTER — Encounter: Payer: Self-pay | Admitting: Obstetrics and Gynecology

## 2014-09-19 ENCOUNTER — Ambulatory Visit (INDEPENDENT_AMBULATORY_CARE_PROVIDER_SITE_OTHER): Payer: BLUE CROSS/BLUE SHIELD | Admitting: Obstetrics and Gynecology

## 2014-09-19 VITALS — BP 118/70 | HR 80 | Resp 20 | Ht 65.5 in | Wt 248.4 lb

## 2014-09-19 DIAGNOSIS — Z0000 Encounter for general adult medical examination without abnormal findings: Secondary | ICD-10-CM | POA: Diagnosis not present

## 2014-09-19 DIAGNOSIS — D259 Leiomyoma of uterus, unspecified: Secondary | ICD-10-CM | POA: Diagnosis not present

## 2014-09-19 DIAGNOSIS — M25559 Pain in unspecified hip: Secondary | ICD-10-CM | POA: Diagnosis not present

## 2014-09-19 DIAGNOSIS — N939 Abnormal uterine and vaginal bleeding, unspecified: Secondary | ICD-10-CM | POA: Diagnosis not present

## 2014-09-19 DIAGNOSIS — Z01419 Encounter for gynecological examination (general) (routine) without abnormal findings: Secondary | ICD-10-CM | POA: Diagnosis not present

## 2014-09-19 LAB — POCT URINALYSIS DIPSTICK
BILIRUBIN UA: NEGATIVE
GLUCOSE UA: NEGATIVE
Ketones, UA: NEGATIVE
Leukocytes, UA: NEGATIVE
Nitrite, UA: NEGATIVE
Protein, UA: NEGATIVE
Urobilinogen, UA: NEGATIVE
pH, UA: 5

## 2014-09-19 MED ORDER — MEGESTROL ACETATE 20 MG PO TABS: 20.0000 mg | ORAL_TABLET | Freq: Every day | ORAL | Status: DC

## 2014-09-19 NOTE — Patient Instructions (Signed)
Leuprolide depot injection or implant What is this medicine? LEUPROLIDE (loo PROE lide) is a man-made protein that acts like a natural hormone in the body. It decreases testosterone in men and decreases estrogen in women. In men, this medicine is used to treat advanced prostate cancer. In women, some forms of this medicine may be used to treat endometriosis, uterine fibroids, or other female hormone-related problems. This medicine may be used for other purposes; ask your health care provider or pharmacist if you have questions. COMMON BRAND NAME(S): Eligard, Lupron Depot, Lupron Depot-Ped, Viadur What should I tell my health care provider before I take this medicine? They need to know if you have any of these conditions: -diabetes -heart disease or previous heart attack -high blood pressure -high cholesterol -osteoporosis -pain or difficulty passing urine -spinal cord metastasis -stroke -tobacco smoker -unusual vaginal bleeding (women) -an unusual or allergic reaction to leuprolide, benzyl alcohol, other medicines, foods, dyes, or preservatives -pregnant or trying to get pregnant -breast-feeding How should I use this medicine? This medicine is for injection into a muscle or for implant or injection under the skin. It is given by a health care professional in a hospital or clinic setting. The specific product will determine how it will be given to you. Make sure you understand which product you receive and how often you will receive it. Talk to your pediatrician regarding the use of this medicine in children. Special care may be needed. Overdosage: If you think you have taken too much of this medicine contact a poison control center or emergency room at once. NOTE: This medicine is only for you. Do not share this medicine with others. What if I miss a dose? It is important not to miss a dose. Call your doctor or health care professional if you are unable to keep an appointment. Depot  injections: Depot injections are given either once-monthly, every 12 weeks, every 16 weeks, or every 24 weeks depending on the product you are prescribed. The product you are prescribed will be based on if you are female or female, and your condition. Make sure you understand your product and dosing. Implant dosing: The implant is removed and replaced once a year. The implant is only used in males. What may interact with this medicine? Do not take this medicine with any of the following medications: -chasteberry This medicine may also interact with the following medications: -herbal or dietary supplements, like black cohosh or DHEA -female hormones, like estrogens or progestins and birth control pills, patches, rings, or injections -female hormones, like testosterone This list may not describe all possible interactions. Give your health care provider a list of all the medicines, herbs, non-prescription drugs, or dietary supplements you use. Also tell them if you smoke, drink alcohol, or use illegal drugs. Some items may interact with your medicine. What should I watch for while using this medicine? Visit your doctor or health care professional for regular checks on your progress. During the first weeks of treatment, your symptoms may get worse, but then will improve as you continue your treatment. You may get hot flashes, increased bone pain, increased difficulty passing urine, or an aggravation of nerve symptoms. Discuss these effects with your doctor or health care professional, some of them may improve with continued use of this medicine. Female patients may experience a menstrual cycle or spotting during the first months of therapy with this medicine. If this continues, contact your doctor or health care professional. What side effects may I notice from  receiving this medicine? Side effects that you should report to your doctor or health care professional as soon as possible: -allergic reactions like  skin rash, itching or hives, swelling of the face, lips, or tongue -breathing problems -chest pain -depression or memory disorders -pain in your legs or groin -pain at site where injected or implanted -severe headache -swelling of the feet and legs -visual changes -vomiting Side effects that usually do not require medical attention (report to your doctor or health care professional if they continue or are bothersome): -breast swelling or tenderness -decrease in sex drive or performance -diarrhea -hot flashes -loss of appetite -muscle, joint, or bone pains -nausea -redness or irritation at site where injected or implanted -skin problems or acne This list may not describe all possible side effects. Call your doctor for medical advice about side effects. You may report side effects to FDA at 1-800-FDA-1088. Where should I keep my medicine? This drug is given in a hospital or clinic and will not be stored at home. NOTE: This sheet is a summary. It may not cover all possible information. If you have questions about this medicine, talk to your doctor, pharmacist, or health care provider.  2015, [employer]vier/Gold Standard. (2009-07-15 14:41:21)  EXERCISE AND DIET:  We recommended that you start or continue a regular exercise program for good health. Regular exercise means any activity that makes your heart beat faster and makes you sweat.  We recommend exercising at least 30 minutes per day at least 3 days a week, preferably 4 or 5.  We also recommend a diet low in fat and sugar.  Inactivity, poor dietary choices and obesity can cause diabetes, heart attack, stroke, and kidney damage, among others.    ALCOHOL AND SMOKING:  Women should limit their alcohol intake to no more than 7 drinks/beers/glasses of wine (combined, not each!) per week. Moderation of alcohol intake to this level decreases your risk of breast cancer and liver damage. And of course, no recreational drugs are part of a healthy  lifestyle.  And absolutely no smoking or even second hand smoke. Most people know smoking can cause heart and lung diseases, but did you know it also contributes to weakening of your bones? Aging of your skin?  Yellowing of your teeth and nails?  CALCIUM AND VITAMIN D:  Adequate intake of calcium and Vitamin D are recommended.  The recommendations for exact amounts of these supplements seem to change often, but generally speaking 600 mg of calcium (either carbonate or citrate) and 800 units of Vitamin D per day seems prudent. Certain women may benefit from higher intake of Vitamin D.  If you are among these women, your doctor will have told you during your visit.    PAP SMEARS:  Pap smears, to check for cervical cancer or precancers,  have traditionally been done yearly, although recent scientific advances have shown that most women can have pap smears less often.  However, every woman still should have a physical exam from her gynecologist every year. It will include a breast check, inspection of the vulva and vagina to check for abnormal growths or skin changes, a visual exam of the cervix, and then an exam to evaluate the size and shape of the uterus and ovaries.  And after 43 years of age, a rectal exam is indicated to check for rectal cancers. We will also provide age appropriate advice regarding health maintenance, like when you should have certain vaccines, screening for sexually transmitted diseases, bone density testing,  colonoscopy, mammograms, etc.   MAMMOGRAMS:  All women over 40 years old should have a yearly mammogram. Many facilities now offer a "3D" mammogram, which may cost around $50 extra out of pocket. If possible,  we recommend you accept the option to have the 3D mammogram performed.  It both reduces the number of women who will be called back for extra views which then turn out to be normal, and it is better than the routine mammogram at detecting truly abnormal areas.    COLONOSCOPY:   Colonoscopy to screen for colon cancer is recommended for all women at age 59.  We know, you hate the idea of the prep.  We agree, BUT, having colon cancer and not knowing it is worse!!  Colon cancer so often starts as a polyp that can be seen and removed at colonscopy, which can quite literally save your life!  And if your first colonoscopy is normal and you have no family history of colon cancer, most women don't have to have it again for 10 years.  Once every ten years, you can do something that may end up saving your life, right?  We will be happy to help you get it scheduled when you are ready.  Be sure to check your insurance coverage so you understand how much it will cost.  It may be covered as a preventative service at no cost, but you should check your particular policy.

## 2014-09-19 NOTE — Progress Notes (Signed)
Patient ID: [name], female   DOB: [birth year], 44 y.o.   MRN: [id number] 44 y.o. [id number] Single African American female here for annual exam.   Recent trip to the emergency department on 09/04/14 for vaginal bleeding and pain and received Rx for Megace.  Hgb was 12.9. Asking about endometrial ablation.    Has known fibroids and hx of endometriosis. Took Micronor for 4 months.  Menses were just once a month for 5 - 7 days without a lot of pain.  Menses the last time came early.  Menses ran down her legs.  Pain accompanied this as well.  Patient also having pain under her ribs which had gone away.  (Normal endoscopy and HIDA scan.)  Received Rx for Megace after ER visit.  Taking Megace 20 mg daily and bleeding and most of pain stopped.  Still has some periodic right sided pain.   Just not feeling very well overall.  Patient has a goiter and has been seeing Dr. Loanne Drilling for care.  Having menstrual migraines and other migraines.  Seeing Dr. Tomi Likens for headaches.   Has annual with Dr. Regis Bill next week.   PCP:   [name], MD  Patient's last menstrual period was 09/13/2014 (exact date).          Sexually active: Yes.  female  The current method of family planning is tubal ligation.    Exercising: No.   Smoker:  no  Health Maintenance: Pap:  09-10-13 Neg:Neg HR HPV History of abnormal Pap:  Yes, 1995 abnormal pap per patient,but no treatment to cervix. MMG:  12-03-13 [facility]B/neg:[facility]. Colonoscopy:  2000 due to abd. Pain:Dx'd with IBS BMD:   n/a  Result  n/a TDaP:  Up to date with PCP Screening Labs:  Hb today: PCP, Urine today: Mod. RBCs--asymptomatic(see LMP)   reports that she has never smoked. She has never used smokeless tobacco. She reports that she does not drink alcohol or use illicit drugs.  Past Medical History  Diagnosis Date  . Anxiety   . Chest pain     neg stress test summer 2010  . Migraines   . Endometriosis   . Sinusitis   . Ovarian  torsion   . Allergic rhinitis     hx of testing and allergic to spring and fall f= pollens  . Anemia     nos  . GERD (gastroesophageal reflux disease)   . Endometriosis   . Hyperlipidemia   . Headache(784.0)   . Labial cyst 04/08/2011    early infection use hotpcompresses    . Thyroid disease     goiter  . STD (sexually transmitted disease)     Pos. Chlamydia--in her 5's  . Goiter   . Irritable bowel syndrome   . Fibroids   . Hypertension     Past Surgical History  Procedure Laterality Date  . Tubal ligation    . Unilateral salpingectomy      right  age 72 ectopics.  . Cesarean section  2002, 2004    x2  . Pelvic laparoscopy      x9  . Radiology with anesthesia N/A 09/05/2014    Procedure: MRI OF BRAIN WITHOUT CONTRAST;  Surgeon: Medication Radiologist, MD;  Location: [facility];  Service: Radiology;  Laterality: N/A;  MRI/DR. JAFFE    Current Outpatient Prescriptions  Medication Sig Dispense Refill  . atenolol (TENORMIN) 50 MG tablet Take 1 tablet (50 mg total) by mouth daily. 30 tablet 0  . BLACK  COHOSH PO Take 1 capsule by mouth daily.    . citalopram (CELEXA) 20 MG tablet TAKE 1 TABLET BY MOUTH ONCE DAILY 120 tablet 0  . fluticasone (FLONASE) 50 MCG/ACT nasal spray Place 2 sprays into both nostrils daily. (Patient taking differently: Place 2 sprays into both nostrils daily as needed for allergies. ) 1 g prn  . ibuprofen (ADVIL,MOTRIN) 200 MG tablet Take 1,000 mg by mouth every 6 (six) hours as needed for moderate pain (headache).     . loratadine (CLARITIN) 10 MG tablet Take 10 mg by mouth daily as needed for allergies.     . megestrol (MEGACE) 20 MG tablet Take 1 tablet (20 mg total) by mouth daily. 30 tablet 0  . Multiple Vitamin (MULTIVITAMIN WITH MINERALS) TABS tablet Take 1 tablet by mouth daily.    . pantoprazole (PROTONIX) 40 MG tablet Take 1 tablet (40 mg total) by mouth daily. 90 tablet 3  . promethazine (PHENERGAN) 12.5 MG tablet Take 12.5 mg by mouth every 6  (six) hours as needed for nausea or vomiting.   1  . triamterene-hydrochlorothiazide (MAXZIDE-25) 37.5-25 MG per tablet TAKE 1 TABLET BY MOUTH DAILY 30 tablet 3   No current facility-administered medications for this visit.    Family History  Problem Relation Age of Onset  . Anxiety disorder Mother     mother is on xanax and apparently applying for  social security diabilty for her anxiety. t  . Depression Mother   . Diabetes Mother   . Hypertension Mother   . Coronary artery disease    . Diabetes    . Hyperlipidemia    . Other      history of child death in family   . Heart attack Father   . Seizures Father   . Stroke Maternal Grandmother   . Diabetes Paternal Grandmother   . Hypertension Paternal Grandmother   . Colon cancer Neg Hx   . Stomach cancer Neg Hx   . Esophageal cancer Neg Hx     ROS:  Pertinent items are noted in HPI.  Otherwise, a comprehensive ROS was negative.  Exam:   BP 118/70 mmHg  Pulse 80  Resp 20  Ht 5' 5.5" (1.664 m)  Wt 248 lb 6.4 oz (112.674 kg)  BMI 40.69 kg/m2  LMP 09/13/2014 (Exact Date)    General appearance: alert, cooperative and appears stated age Head: Normocephalic, without obvious abnormality, atraumatic Neck: no adenopathy, supple, symmetrical, trachea midline and thyroid normal to inspection and palpation Lungs: clear to auscultation bilaterally Breasts: normal appearance, no masses or tenderness, Inspection negative, No nipple retraction or dimpling, No nipple discharge or bleeding, No axillary or supraclavicular adenopathy Heart: regular rate and rhythm Abdomen: soft, non-tender; bowel sounds normal; no masses,  no organomegaly Extremities: extremities normal, atraumatic, no cyanosis or edema Skin: Skin color, texture, turgor normal. No rashes or lesions Lymph nodes: Cervical, supraclavicular, and axillary nodes normal. No abnormal inguinal nodes palpated Neurologic: Grossly normal  Pelvic: External genitalia:  no lesions               Urethra:  normal appearing urethra with no masses, tenderness or lesions              Bartholins and Skenes: normal                 Vagina: normal appearing vagina with normal color and discharge, no lesions              Cervix: no  lesions              Pap taken: No. Bimanual Exam:  Uterus:  normal size, contour, position, consistency, mobility, non-tender              Adnexa: normal adnexa and no mass, fullness, tenderness              Rectovaginal: Yes.  .  Confirms.              Anus:  normal sphincter tone, no lesions  Chaperone was present for exam.  Assessment:   Well woman visit with normal exam. History of fibroids and endometriosis.  Recent failure of Micronor to control symptoms.  Now on Megace. Status post tubal ligation.  Status post multiple laparoscopies and laparotomies.   Plan: Yearly mammogram recommended after age 47.  Recommended self breast exam.  Pap and HR HPV as above. Discussed Calcium, Vitamin D, regular exercise program including cardiovascular and weight bearing exercise. Labs performed.  No..   See orders. Refills given on medications.  Yes.  .  See orders.  Continue Megace 20 mg daily.  Follow up annually and prn.   Additional counseling given - 15 minutes face to face time of which over 50% spent in counseling regarding options for treatment of pain, bleeding, fibroids and endometriosis.  Discussed endometrial ablation as a primary treatment of uterine bleeding but not pain related to endometriosis.  Patient declining surgical procedures.  Our discussion focused on Depo Lupron and Depo Provera. I have reviewed the risks and benefits of each, and she elects to try a 6 month course of Depo Lupron.  She would like to do monthly injections.  Will precert this.  After visit summary provided.

## 2014-09-20 ENCOUNTER — Telehealth: Payer: Self-pay | Admitting: Internal Medicine

## 2014-09-20 NOTE — Telephone Encounter (Signed)
error/kh

## 2014-09-23 ENCOUNTER — Telehealth: Payer: Self-pay | Admitting: Emergency Medicine

## 2014-09-23 ENCOUNTER — Other Ambulatory Visit (INDEPENDENT_AMBULATORY_CARE_PROVIDER_SITE_OTHER): Payer: BLUE CROSS/BLUE SHIELD

## 2014-09-23 DIAGNOSIS — Z0000 Encounter for general adult medical examination without abnormal findings: Secondary | ICD-10-CM | POA: Diagnosis not present

## 2014-09-23 LAB — BASIC METABOLIC PANEL
BUN: 9 mg/dL (ref 6–23)
CO2: 26 mEq/L (ref 19–32)
Calcium: 8.8 mg/dL (ref 8.4–10.5)
Chloride: 107 mEq/L (ref 96–112)
Creatinine, Ser: 1.03 mg/dL (ref 0.40–1.20)
GFR: 75.03 mL/min (ref >60.00)
Glucose, Bld: 124 mg/dL — ABNORMAL HIGH (ref 70–99)
Potassium: 3.5 mEq/L (ref 3.5–5.1)
Sodium: 140 mEq/L (ref 135–145)

## 2014-09-23 LAB — CBC WITH DIFFERENTIAL/PLATELET
Basophils Absolute: 0.1 10*3/uL (ref 0.0–0.1)
Basophils Relative: 0.6 % (ref 0.0–3.0)
Eosinophils Absolute: 0.3 10*3/uL (ref 0.0–0.7)
Eosinophils Relative: 3.1 % (ref 0.0–5.0)
HCT: 36.5 % (ref 36.0–46.0)
Hemoglobin: 12.2 g/dL (ref 12.0–15.0)
Lymphocytes Relative: 41.4 % (ref 12.0–46.0)
Lymphs Abs: 3.6 10*3/uL (ref 0.7–4.0)
MCHC: 33.5 g/dL (ref 30.0–36.0)
MCV: 90 fl (ref 78.0–100.0)
Monocytes Absolute: 0.6 10*3/uL (ref 0.1–1.0)
Monocytes Relative: 6.6 % (ref 3.0–12.0)
Neutro Abs: 4.2 10*3/uL (ref 1.4–7.7)
Neutrophils Relative %: 48.3 % (ref 43.0–77.0)
Platelets: 396 10*3/uL (ref 150.0–400.0)
RBC: 4.05 Mil/uL (ref 3.87–5.11)
RDW: 13.7 % (ref 11.5–15.5)
WBC: 8.7 10*3/uL (ref 4.0–10.5)

## 2014-09-23 LAB — TSH: TSH: 1.2 u[IU]/mL (ref 0.35–4.50)

## 2014-09-23 LAB — HEPATIC FUNCTION PANEL
ALT: 14 U/L (ref 0–35)
AST: 16 U/L (ref 0–37)
Albumin: 3.9 g/dL (ref 3.5–5.2)
Alkaline Phosphatase: 50 U/L (ref 39–117)
BILIRUBIN DIRECT: 0 mg/dL (ref 0.0–0.3)
TOTAL PROTEIN: 6.8 g/dL (ref 6.0–8.3)
Total Bilirubin: 0.2 mg/dL (ref 0.2–1.2)

## 2014-09-23 LAB — LIPID PANEL
Cholesterol: 194 mg/dL (ref 0–200)
HDL: 27.6 mg/dL — ABNORMAL LOW (ref >39.00)
LDL Cholesterol: 148 mg/dL — ABNORMAL HIGH (ref 0–99)
NonHDL: 166.87
Total CHOL/HDL Ratio: 7
Triglycerides: 95 mg/dL (ref 0.0–149.0)
VLDL: 19 mg/dL (ref 0.0–40.0)

## 2014-09-23 NOTE — Telephone Encounter (Signed)
-----   Message from Nunzio Cobbs, MD sent at 09/22/2014  8:13 AM EDT ----- Regarding: please precert Depo Lupron Hello Sally,   Please precert Depo Lupron for fibroids and pelvic pain.   Has HTN and cannot take combined oral contraceptives.  Failed Micronor.  Currently on Megace to control bleeding and pain.   She would like to do monthly injections instead of the 3 month injection.  Treatment plan will be for 6 months.   Thank you!  [place]

## 2014-09-23 NOTE — Telephone Encounter (Signed)
Precert form completed and to Dr. Quincy Simmonds.

## 2014-09-25 ENCOUNTER — Ambulatory Visit (INDEPENDENT_AMBULATORY_CARE_PROVIDER_SITE_OTHER): Payer: BLUE CROSS/BLUE SHIELD | Admitting: Internal Medicine

## 2014-09-25 ENCOUNTER — Encounter: Payer: Self-pay | Admitting: Internal Medicine

## 2014-09-25 VITALS — BP 100/70 | Temp 98.2°F | Ht 65.5 in | Wt 255.0 lb

## 2014-09-25 DIAGNOSIS — Z23 Encounter for immunization: Secondary | ICD-10-CM

## 2014-09-25 DIAGNOSIS — Z0000 Encounter for general adult medical examination without abnormal findings: Secondary | ICD-10-CM | POA: Diagnosis not present

## 2014-09-25 DIAGNOSIS — Z8679 Personal history of other diseases of the circulatory system: Secondary | ICD-10-CM

## 2014-09-25 DIAGNOSIS — Z8669 Personal history of other diseases of the nervous system and sense organs: Secondary | ICD-10-CM

## 2014-09-25 DIAGNOSIS — E785 Hyperlipidemia, unspecified: Secondary | ICD-10-CM | POA: Diagnosis not present

## 2014-09-25 DIAGNOSIS — E786 Lipoprotein deficiency: Secondary | ICD-10-CM

## 2014-09-25 DIAGNOSIS — Z79899 Other long term (current) drug therapy: Secondary | ICD-10-CM

## 2014-09-25 DIAGNOSIS — E669 Obesity, unspecified: Secondary | ICD-10-CM | POA: Diagnosis not present

## 2014-09-25 DIAGNOSIS — R739 Hyperglycemia, unspecified: Secondary | ICD-10-CM

## 2014-09-25 MED ORDER — TRIAMTERENE-HCTZ 37.5-25 MG PO TABS: 1.0000 | ORAL_TABLET | Freq: Every day | ORAL | Status: DC

## 2014-09-25 NOTE — Progress Notes (Signed)
Pre visit review using our clinic review tool, if applicable. No additional management support is needed unless otherwise documented below in the visit note.  Chief Complaint  Patient presents with  . Annual Exam    meds     HPI: Patient  [name]  44 y.o. 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  Single mom . Doing well but chaotic and wants to get her weight better  The  b blocker seems to be helping the headaches so far .  In the past 6 weeks   Health Maintenance  Topic Date Due  . HIV Screening  09/25/2015 (Originally 03/20/1985)  . MAMMOGRAM  12/04/2014  . TETANUS/TDAP  01/26/2015  . INFLUENZA VACCINE  08/26/2015   Health Maintenance Review LIFESTYLE:  Exercise:   Not yet   Thinking about it.  Tobacco/ETS: no Alcohol: not  really ... Sugar beverages:  ocass  Tea and honey  Water  Sleep:  Off  .  Drug use: no:  PAP: 8 16 gyne    ROS:  GEN/ HEENT: No fever, significant weight changes sweats headaches vision problems hearing changes, CV/ PULM; No chest pain shortness of breath cough, syncope,edema  change in exercise tolerance. GI /GU: No adominal pain, vomiting, change in bowel habits. No blood in the stool. No significant GU symptoms. SKIN/HEME: ,no acute skin rashes suspicious lesions or bleeding. No lymphadenopathy, nodules, masses.  NEURO/ PSYCH:  No neurologic signs such as weakness numbness. No depression anxiety. IMM/ Allergy: No unusual infections.  Allergy .   REST of 12 system review negative except as per HPI   Past Medical History  Diagnosis Date  . Anxiety   . Chest pain     neg stress test summer 2010  . Migraines   . Endometriosis   . Sinusitis   . Ovarian torsion   .  Allergic rhinitis     hx of testing and allergic to spring and fall f= pollens  . Anemia     nos  . GERD (gastroesophageal reflux disease)   . Endometriosis   . Hyperlipidemia   . Headache(784.0)   . Labial cyst 04/08/2011    early infection use hotpcompresses    . Thyroid disease     goiter  . STD (sexually transmitted disease)     Pos. Chlamydia--in her 2's  . Goiter   . Irritable bowel syndrome   . Fibroids   . Hypertension     Past Surgical History  Procedure Laterality Date  . Tubal ligation    . Unilateral salpingectomy      right  age 55 ectopics.  . Cesarean section  2002, 2004    x2  . Pelvic laparoscopy      x9  . Radiology with anesthesia N/A 09/05/2014    Procedure: MRI OF BRAIN WITHOUT CONTRAST;  Surgeon: Medication Radiologist, MD;  Location: [facility];  Service: Radiology;  Laterality: N/A;  MRI/DR. JAFFE    Family History  Problem Relation Age of Onset  . Anxiety disorder Mother     mother is on xanax and apparently applying for  social security diabilty for her anxiety. t  . Depression Mother   . Diabetes Mother   . Hypertension Mother   . Coronary artery disease    . Diabetes    . Hyperlipidemia    . Other      history of child death in family   . Heart attack Father   . Seizures Father   . Stroke Maternal Grandmother   . Diabetes Paternal Grandmother   . Hypertension Paternal Grandmother   . Colon cancer Neg Hx   . Stomach cancer Neg Hx   . Esophageal cancer Neg Hx     Social History   Social History  . Marital Status: Single    Spouse Name: N/A  . Number of Children: N/A  . Years of Education: N/A   Social History Main Topics  . Smoking status: Never Smoker   . Smokeless tobacco: Never Used  . Alcohol Use: No  . Drug Use: No  . Sexual Activity:    Partners: Male    Birth Control/ Protection: Surgical     Comment: Tubal   Other Topics Concern  . 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   And graduated as SW.      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 atenolol (TENORMIN) 50 MG tablet Take 1 tablet (50 mg total) by mouth daily. 30 tablet 0  . BLACK COHOSH PO Take 1 capsule by mouth daily.    . citalopram (CELEXA) 20 MG tablet TAKE 1 TABLET BY MOUTH ONCE DAILY 120 tablet 0  . fluticasone (FLONASE) 50 MCG/ACT nasal spray Place 2 sprays into both nostrils daily. (Patient taking differently: Place 2 sprays into both nostrils daily as needed for allergies. ) 1 g prn  . ibuprofen (ADVIL,MOTRIN) 200 MG tablet Take 1,000 mg by mouth every 6 (six) hours as needed for moderate pain (headache).     . loratadine (CLARITIN) 10 MG tablet Take 10 mg by mouth daily as needed for allergies.     . megestrol (MEGACE) 20 MG tablet Take 1 tablet (20 mg total) by mouth daily. 30 tablet 0  . Multiple Vitamin (MULTIVITAMIN WITH MINERALS) TABS tablet Take 1 tablet by mouth daily.    . pantoprazole (PROTONIX) 40 MG tablet Take 1 tablet (40 mg total) by mouth daily. 90 tablet 3  . promethazine (PHENERGAN) 12.5 MG tablet Take 12.5 mg by mouth every 6 (six) hours as needed for nausea or vomiting.   1  . triamterene-hydrochlorothiazide (MAXZIDE-25) 37.5-25 MG per tablet TAKE 1 TABLET BY MOUTH DAILY 30 tablet 3   No facility-administered medications prior to visit.     EXAM:  BP 100/70 mmHg  Temp(Src) 98.2 F (36.8 C) (Oral)  Ht 5' 5.5" (1.664 m)  Wt 255  lb (115.667 kg)  BMI 41.77 kg/m2  LMP 09/13/2014  (Exact Date)  Body mass index is 41.77 kg/(m^2).  Physical Exam: Vital signs reviewed TDV:VOHY is a well-developed well-nourished alert cooperative    who appearsr stated age in no acute distress.  HEENT: normocephalic atraumatic , Eyes: PERRL EOM's full, conjunctiva clear, Nares: paten,t no deformity discharge or tenderness., Ears: no deformity EAC's clear TMs with normal landmarks. Mouth: clear OP, no lesions, edema.  Moist mucous membranes. Dentition in adequate repair. NECK: supple without masses, thyromegaly or bruits. CHEST/PULM:  Clear to auscultation and percussion breath sounds equal no wheeze , rales or rhonchi. No chest wall deformities or tenderness. CV: PMI is nondisplaced, S1 S2 no gallops, murmurs, rubs. Peripheral pulses are full without delay.No JVD .  ABDOMEN: Bowel sounds normal nontender  No guard or rebound, no hepato splenomegal no CVA tenderness.  No hernia. Extremtities:  No clubbing cyanosis or edema, no acute joint swelling or redness no focal atrophy NEURO:  Oriented x3, cranial nerves 3-12 appear to be intact, no obvious focal weakness,gait within normal limits no abnormal reflexes or asymmetrical SKIN: No acute rashes normal turgor, color, no bruising or petechiae. PSYCH: Oriented, good eye contact, no obvious depression anxiety, cognition and judgment appear normal. LN: no cervical axillary inguinal adenopathy  Lab Results  Component Value Date   WBC 8.7 09/23/2014   HGB 12.2 09/23/2014   HCT 36.5 09/23/2014   PLT 396.0 09/23/2014   GLUCOSE 124* 09/23/2014   CHOL 194 09/23/2014   TRIG 95.0 09/23/2014   HDL 27.60* 09/23/2014   LDLDIRECT 129.7 04/08/2011   LDLCALC 148* 09/23/2014   ALT 14 09/23/2014   AST 16 09/23/2014   NA 140 09/23/2014   K 3.5 09/23/2014   CL 107 09/23/2014   CREATININE 1.03 09/23/2014   BUN 9 09/23/2014   CO2 26 09/23/2014   TSH 1.20 09/23/2014   HGBA1C 5.8 08/24/2006  was not fasting fro above labs had sunny d OJ   Pre visit    BP Readings from Last 3 Encounters:  09/25/14 100/70  09/19/14 118/70  09/11/14 124/59   Wt Readings from Last 3 Encounters:  09/25/14 255 lb (115.667 kg)  09/19/14 248 lb 6.4 oz (112.674 kg)  09/11/14 253 lb (114.76 kg)     ASSESSMENT AND PLAN:  Discussed the following assessment and plan:  Visit for preventive health examination  Low HDL (under 40) - Plan: Amb ref to Medical Nutrition Therapy-MNT  Obesity - Plan: Amb ref to Medical Nutrition Therapy-MNT  Hyperlipidemia - Plan: Amb ref to Medical Nutrition Therapy-MNT  Need for prophylactic vaccination and inoculation against influenza - Plan: Flu Vaccine QUAD 36+ mos PF IM (Fluarix & Fluzone Quad PF)  Hx of atypical migraine  Medication management - stay in celexa and diuretic    Hyperglycemia - non fasting  nutrition referral  get a1c in future  labs See in a year but can repeat  Lipids  a1c etc if needed before then  ( bg in lab was not fasting  ) at risk for dm etc.  Need yearly lab at least Patient Care Team: Burnis Medin, MD as PCP - General Fay Records, MD (Cardiology) Renato Shin, MD as Consulting Physician (Endocrinology) Nunzio Cobbs, MD as Consulting Physician (Obstetrics and Gynecology) Pieter Partridge, DO as Consulting Physician (Neurology) Patient Instructions   Healthy measures    Eating and exercise  Will help cholesterol and prevent diabetes and helps  You  feel more energy.   You will be contacted about nutrition dietary referral to help. Track your sleep.   talk with your specialists  About fmla form as   Wouldn't have enough  information to fillk out correctly .   Healthy lifestyle includes : At least 150 minutes of exercise weeks  , weight at healthy levels, which is usually   BMI 19-25. Avoid trans fats and processed foods;  Increase fresh fruits and veges to 5 servings per day. And avoid sweet beverages including tea and juice. Mediterranean diet with olive oil and nuts have been  noted to be heart and brain healthy . Avoid tobacco products . Limit  alcohol to  7 per week for women and 14 servings for men.  Get adequate sleep . Wear seat belts . Don't text and drive .  Wt Readings from Last 3 Encounters:  09/25/14 255 lb (115.667 kg)  09/19/14 248 lb 6.4 oz (112.674 kg)  09/11/14 253 lb (114.76 kg)   BP Readings from Last 3 Encounters:  09/25/14 100/70  09/19/14 118/70  09/11/14 124/59      Why follow it? Research shows. . Those who follow the Mediterranean diet have a reduced risk of heart disease  . The diet is associated with a reduced incidence of Parkinson's and Alzheimer's diseases . People following the diet may have longer life expectancies and lower rates of chronic diseases  . The Dietary Guidelines for Americans recommends the Mediterranean diet as an eating plan to promote health and prevent disease  What Is the Mediterranean Diet?  . Healthy eating plan based on typical foods and recipes of Mediterranean-style cooking . The diet is primarily a plant based diet; these foods should make up a majority of meals   Starches - Plant based foods should make up a majority of meals - They are an important sources of vitamins, minerals, energy, antioxidants, and fiber - Choose whole grains, foods high in fiber and minimally processed items  - Typical grain sources include wheat, oats, barley, corn, brown rice, bulgar, farro, millet, polenta, couscous  - Various types of beans include chickpeas, lentils, fava beans, black beans, white beans   Fruits  Veggies - Large quantities of antioxidant rich fruits & veggies; 6 or more servings  - Vegetables can be eaten raw or lightly drizzled with oil and cooked  - Vegetables common to the traditional Mediterranean Diet include: artichokes, arugula, beets, broccoli, brussel sprouts, cabbage, carrots, celery, collard greens, cucumbers, eggplant, kale, leeks, lemons, lettuce, mushrooms, okra, onions, peas, peppers,  potatoes, pumpkin, radishes, rutabaga, shallots, spinach, sweet potatoes, turnips, zucchini - Fruits common to the Mediterranean Diet include: apples, apricots, avocados, cherries, clementines, dates, figs, grapefruits, grapes, melons, nectarines, oranges, peaches, pears, pomegranates, strawberries, tangerines  Fats - Replace butter and margarine with healthy oils, such as olive oil, canola oil, and tahini  - Limit nuts to no more than a handful a day  - Nuts include walnuts, almonds, pecans, pistachios, pine nuts  - Limit or avoid candied, honey roasted or heavily salted nuts - Olives are central to the [employer] - can be eaten whole or used in a variety of dishes   Meats Protein - Limiting red meat: no more than a few times a month - When eating red meat: choose lean cuts and keep the portion to the size of deck of cards - Eggs: approx. 0 to 4 times a week  - Fish and lean poultry: at least 2 a week  -  Healthy protein sources include, chicken, [place], lean beef, lamb - Increase intake of seafood such as tuna, salmon, trout, mackerel, shrimp, scallops - Avoid or limit high fat processed meats such as sausage and bacon  Dairy - Include moderate amounts of low fat dairy products  - Focus on healthy dairy such as fat free yogurt, skim milk, low or reduced fat cheese - Limit dairy products higher in fat such as whole or 2% milk, cheese, ice cream  Alcohol - Moderate amounts of red wine is ok  - No more than 5 oz daily for women (all ages) and men older than age 50  - No more than 10 oz of wine daily for men younger than 51  Other - Limit sweets and other desserts  - Use herbs and spices instead of salt to flavor foods  - Herbs and spices common to the traditional Mediterranean Diet include: basil, bay leaves, chives, cloves, cumin, fennel, garlic, lavender, marjoram, mint, oregano, parsley, pepper, rosemary, sage, savory, sumac, tarragon, thyme   It's not just a diet, it's a lifestyle:    . The Mediterranean diet includes lifestyle factors typical of those in the region  . Foods, drinks and meals are best eaten with others and savored . Daily physical activity is important for overall good health . This could be strenuous exercise like running and aerobics . This could also be more leisurely activities such as walking, housework, yard-work, or taking the stairs . Moderation is the key; a balanced and healthy diet accommodates most foods and drinks . Consider portion sizes and frequency of consumption of certain foods   Meal Ideas & Options:  . Breakfast:  o Whole wheat toast or whole wheat English muffins with peanut butter & hard boiled egg o Steel cut oats topped with apples & cinnamon and skim milk  o Fresh fruit: banana, strawberries, melon, berries, peaches  o Smoothies: strawberries, bananas, greek yogurt, peanut butter o Low fat greek yogurt with blueberries and granola  o Egg white omelet with spinach and mushrooms o Breakfast couscous: whole wheat couscous, apricots, skim milk, cranberries  . Sandwiches:  o Hummus and grilled vegetables (peppers, zucchini, squash) on whole wheat bread   o Grilled chicken on whole wheat pita with lettuce, tomatoes, cucumbers or tzatziki  o Tuna salad on whole wheat bread: tuna salad made with greek yogurt, olives, red peppers, capers, green onions o Garlic rosemary lamb pita: lamb sauted with garlic, rosemary, salt & pepper; add lettuce, cucumber, greek yogurt to pita - flavor with lemon juice and black pepper  . Seafood:  o Mediterranean grilled salmon, seasoned with garlic, basil, parsley, lemon juice and black pepper o Shrimp, lemon, and spinach whole-grain pasta salad made with low fat greek yogurt  o Seared scallops with lemon orzo  o Seared tuna steaks seasoned salt, pepper, coriander topped with tomato mixture of olives, tomatoes, olive oil, minced garlic, parsley, green onions and cappers  . Meats:  o Herbed greek  chicken salad with kalamata olives, cucumber, feta  o Red bell peppers stuffed with spinach, bulgur, lean ground beef (or lentils) & topped with feta   o Kebabs: skewers of chicken, tomatoes, onions, zucchini, squash  o [place] burgers: made with red onions, mint, dill, lemon juice, feta cheese topped with roasted red peppers . Vegetarian o Cucumber salad: cucumbers, artichoke hearts, celery, red onion, feta cheese, tossed in olive oil & lemon juice  o Hummus and whole grain pita points with a greek salad (lettuce,  tomato, feta, olives, cucumbers, red onion) o Lentil soup with celery, carrots made with vegetable broth, garlic, salt and pepper  o Tabouli salad: parsley, bulgur, mint, scallions, cucumbers, tomato, radishes, lemon juice, olive oil, salt and pepper.           Standley Brooking. Jewelene Mairena M.D.

## 2014-09-25 NOTE — Patient Instructions (Signed)
Healthy measures    Eating and exercise  Will help cholesterol and prevent diabetes and helps  You feel more energy.   You will be contacted about nutrition dietary referral to help. Track your sleep.   talk with your specialists  About fmla form as   Wouldn't have enough  information to fillk out correctly .   Healthy lifestyle includes : At least 150 minutes of exercise weeks  , weight at healthy levels, which is usually   BMI 19-25. Avoid trans fats and processed foods;  Increase fresh fruits and veges to 5 servings per day. And avoid sweet beverages including tea and juice. Mediterranean diet with olive oil and nuts have been noted to be heart and brain healthy . Avoid tobacco products . Limit  alcohol to  7 per week for women and 14 servings for men.  Get adequate sleep . Wear seat belts . Don't text and drive .  Wt Readings from Last 3 Encounters:  09/25/14 255 lb (115.667 kg)  09/19/14 248 lb 6.4 oz (112.674 kg)  09/11/14 253 lb (114.76 kg)   BP Readings from Last 3 Encounters:  09/25/14 100/70  09/19/14 118/70  09/11/14 124/59      Why follow it? Research shows. . Those who follow the Mediterranean diet have a reduced risk of heart disease  . The diet is associated with a reduced incidence of Parkinson's and Alzheimer's diseases . People following the diet may have longer life expectancies and lower rates of chronic diseases  . The Dietary Guidelines for Americans recommends the Mediterranean diet as an eating plan to promote health and prevent disease  What Is the Mediterranean Diet?  . Healthy eating plan based on typical foods and recipes of Mediterranean-style cooking . The diet is primarily a plant based diet; these foods should make up a majority of meals   Starches - Plant based foods should make up a majority of meals - They are an important sources of vitamins, minerals, energy, antioxidants, and fiber - Choose whole grains, foods high in fiber and minimally  processed items  - Typical grain sources include wheat, oats, barley, corn, brown rice, bulgar, farro, millet, polenta, couscous  - Various types of beans include chickpeas, lentils, fava beans, black beans, white beans   Fruits  Veggies - Large quantities of antioxidant rich fruits & veggies; 6 or more servings  - Vegetables can be eaten raw or lightly drizzled with oil and cooked  - Vegetables common to the traditional Mediterranean Diet include: artichokes, arugula, beets, broccoli, brussel sprouts, cabbage, carrots, celery, collard greens, cucumbers, eggplant, kale, leeks, lemons, lettuce, mushrooms, okra, onions, peas, peppers, potatoes, pumpkin, radishes, rutabaga, shallots, spinach, sweet potatoes, turnips, zucchini - Fruits common to the Mediterranean Diet include: apples, apricots, avocados, cherries, clementines, dates, figs, grapefruits, grapes, melons, nectarines, oranges, peaches, pears, pomegranates, strawberries, tangerines  Fats - Replace butter and margarine with healthy oils, such as olive oil, canola oil, and tahini  - Limit nuts to no more than a handful a day  - Nuts include walnuts, almonds, pecans, pistachios, pine nuts  - Limit or avoid candied, honey roasted or heavily salted nuts - Olives are central to the [employer] - can be eaten whole or used in a variety of dishes   Meats Protein - Limiting red meat: no more than a few times a month - When eating red meat: choose lean cuts and keep the portion to the size of deck of cards - Eggs: approx.  0 to 4 times a week  - Fish and lean poultry: at least 2 a week  - Healthy protein sources include, chicken, [place], lean beef, lamb - Increase intake of seafood such as tuna, salmon, trout, mackerel, shrimp, scallops - Avoid or limit high fat processed meats such as sausage and bacon  Dairy - Include moderate amounts of low fat dairy products  - Focus on healthy dairy such as fat free yogurt, skim milk, low or reduced fat  cheese - Limit dairy products higher in fat such as whole or 2% milk, cheese, ice cream  Alcohol - Moderate amounts of red wine is ok  - No more than 5 oz daily for women (all ages) and men older than age 35  - No more than 10 oz of wine daily for men younger than 70  Other - Limit sweets and other desserts  - Use herbs and spices instead of salt to flavor foods  - Herbs and spices common to the traditional Mediterranean Diet include: basil, bay leaves, chives, cloves, cumin, fennel, garlic, lavender, marjoram, mint, oregano, parsley, pepper, rosemary, sage, savory, sumac, tarragon, thyme   It's not just a diet, it's a lifestyle:  . The Mediterranean diet includes lifestyle factors typical of those in the region  . Foods, drinks and meals are best eaten with others and savored . Daily physical activity is important for overall good health . This could be strenuous exercise like running and aerobics . This could also be more leisurely activities such as walking, housework, yard-work, or taking the stairs . Moderation is the key; a balanced and healthy diet accommodates most foods and drinks . Consider portion sizes and frequency of consumption of certain foods   Meal Ideas & Options:  . Breakfast:  o Whole wheat toast or whole wheat English muffins with peanut butter & hard boiled egg o Steel cut oats topped with apples & cinnamon and skim milk  o Fresh fruit: banana, strawberries, melon, berries, peaches  o Smoothies: strawberries, bananas, greek yogurt, peanut butter o Low fat greek yogurt with blueberries and granola  o Egg white omelet with spinach and mushrooms o Breakfast couscous: whole wheat couscous, apricots, skim milk, cranberries  . Sandwiches:  o Hummus and grilled vegetables (peppers, zucchini, squash) on whole wheat bread   o Grilled chicken on whole wheat pita with lettuce, tomatoes, cucumbers or tzatziki  o Tuna salad on whole wheat bread: tuna salad made with greek  yogurt, olives, red peppers, capers, green onions o Garlic rosemary lamb pita: lamb sauted with garlic, rosemary, salt & pepper; add lettuce, cucumber, greek yogurt to pita - flavor with lemon juice and black pepper  . Seafood:  o Mediterranean grilled salmon, seasoned with garlic, basil, parsley, lemon juice and black pepper o Shrimp, lemon, and spinach whole-grain pasta salad made with low fat greek yogurt  o Seared scallops with lemon orzo  o Seared tuna steaks seasoned salt, pepper, coriander topped with tomato mixture of olives, tomatoes, olive oil, minced garlic, parsley, green onions and cappers  . Meats:  o Herbed greek chicken salad with kalamata olives, cucumber, feta  o Red bell peppers stuffed with spinach, bulgur, lean ground beef (or lentils) & topped with feta   o Kebabs: skewers of chicken, tomatoes, onions, zucchini, squash  o [place] burgers: made with red onions, mint, dill, lemon juice, feta cheese topped with roasted red peppers . Vegetarian o Cucumber salad: cucumbers, artichoke hearts, celery, red onion, feta cheese, tossed in  olive oil & lemon juice  o Hummus and whole grain pita points with a greek salad (lettuce, tomato, feta, olives, cucumbers, red onion) o Lentil soup with celery, carrots made with vegetable broth, garlic, salt and pepper  o Tabouli salad: parsley, bulgur, mint, scallions, cucumbers, tomato, radishes, lemon juice, olive oil, salt and pepper.

## 2014-10-01 NOTE — Telephone Encounter (Signed)
Calling patient with authorization from [place] for Lupron Depot 3.75.  Patient will need to authorize shipment and call with menses to schedule injection.  Message left to return call to [place] at [phone].

## 2014-10-04 NOTE — Telephone Encounter (Signed)
Called patient again to follow up.  Lupron is authorized, but patient has not authorized shipment. Patient states she has to wait due to cost of Lupron at this time.  Patient currently on Megace, states she feels all the symptoms like she will be starting her cycle but has not had bleeding.  Patient with history of tubal ligation.   Advised patient to please call our office when she authorizes shipment of Lupron, if has not started cycle by then, will obtain instructions from Dr. Quincy Simmonds for plan.   Patient asks if Dr. Quincy Simmonds can complete forms for her for FMLA. Patient states she is on intermittent FMLA for both Migraines and Pain related to Menses and heavy bleeding. Currently, Dr. Regis Bill writing her FMLA. Per patient report, Dr. Regis Bill would like patient to have her specialists complete forms for FMLA or provide guidelines so that Dr. Regis Bill can continue with forms.   Advised would ask Dr. Quincy Simmonds to review and would return call.  Patient agreeable.

## 2014-10-07 NOTE — Telephone Encounter (Signed)
Patient and I discussed option of Depo Provera instead of Depo Lupron.  If cost of the Lupron is an issue, the Depo Provera may be a good choice.  OK to give Depo Provera 150 mg IM q 3 months until her annual exam is due.  She would need to stop the Megace if she is going to take the Depo Lupron.   With respect to the FMLA papers, our office policy is that we do not do FLMA unless it is related to surgery and recovery.   If the patient is needing time off from work due to her menstrual pain and bleeding, I recommend a change in her therapy, i.e. Depo Provera so she can get relief from her symptoms!

## 2014-10-09 ENCOUNTER — Telehealth: Payer: Self-pay | Admitting: Neurology

## 2014-10-09 DIAGNOSIS — G43419 Hemiplegic migraine, intractable, without status migrainosus: Secondary | ICD-10-CM

## 2014-10-09 MED ORDER — ATENOLOL 50 MG PO TABS: 50.0000 mg | ORAL_TABLET | Freq: Every day | ORAL | Status: DC

## 2014-10-09 NOTE — Telephone Encounter (Signed)
Spoke with patient and message from Dr. Quincy Simmonds discussed.   Patient currently on Megace, no vaginal bleeding but continues to have pain. She states that bleeding if it starts is very sporadic. She does plan on starting Lupron but is waiting to place order. Cost of Lupron and Depo Provera will be about the same per patient. She has coverage for Lupron.   Advised patient of FMLA policy, she states that Dr. Regis Bill will write for her but is requesting guidelines for patient to complete FMLA from Dr. Quincy Simmonds.

## 2014-10-09 NOTE — Telephone Encounter (Signed)
VM-Pt stated she was having some issues and needed a call back at [phone]

## 2014-10-09 NOTE — Telephone Encounter (Signed)
Spoke with pt. Pt. Just needs refills sent in of Atenolol, which I have sent in to the pharmacy and she needs FMLA forms filled out for migraines. States that she is improved on medication. Thanks

## 2014-10-10 ENCOUNTER — Telehealth: Payer: Self-pay | Admitting: Emergency Medicine

## 2014-10-10 NOTE — Telephone Encounter (Signed)
Does not need to stop the Megace to receive the Lupron.  Has had a tubal ligation.  Just do UPT prior to injection.  Stop Megace when she does the Lupron injection.

## 2014-10-10 NOTE — Telephone Encounter (Signed)
Dr. Quincy Simmonds,  Patient's Lupron arrived today to office.   She states she has not been having a cycle because she is taking Megace daily.  Will patient need to stop Megace to trigger a cycle and then call with cycle to start Lupron?

## 2014-10-10 NOTE — Telephone Encounter (Signed)
Call to patient. She is ready to schedule as soon as possible. Took last pill of Megace today in bottle, won't pick up refill.  Advised will have urine pregnancy testing tomorrow before injection.  Patient agreeable.  Scheduled nurse visit for 10/11/14 at 1030. Routing to provider for final review. Patient agreeable to disposition. Will close encounter.

## 2014-10-10 NOTE — Telephone Encounter (Signed)
Please let patient know that we will contact Dr. Velora Mediate office directly regarding FLMA.  Then please share the following with Dr. Velora Mediate office/nurse:  There are no specific guidelines for intermittent FMLA for gynecologic issues.  This will be between PCP and patient.  Our approach is that if intermittent FLMA is required, this is an indication that a gynecologic problem is not currently being adequately treated and that surgery or a change in therapy is strongly indicated.  We do not recommend delays in care if this means that the patient is unable to work due to the medical problem.

## 2014-10-11 ENCOUNTER — Telehealth: Payer: Self-pay | Admitting: Neurology

## 2014-10-11 ENCOUNTER — Ambulatory Visit (INDEPENDENT_AMBULATORY_CARE_PROVIDER_SITE_OTHER): Payer: BLUE CROSS/BLUE SHIELD | Admitting: *Deleted

## 2014-10-11 VITALS — BP 102/62 | HR 72 | Resp 16 | Ht 65.5 in | Wt 247.0 lb

## 2014-10-11 DIAGNOSIS — D259 Leiomyoma of uterus, unspecified: Secondary | ICD-10-CM

## 2014-10-11 LAB — POCT URINE PREGNANCY: Preg Test, Ur: NEGATIVE

## 2014-10-11 MED ORDER — LEUPROLIDE ACETATE 3.75 MG IM KIT
3.7500 mg | PACK | Freq: Once | INTRAMUSCULAR | Status: AC
  Administered 2014-10-11: 3.75 mg via INTRAMUSCULAR

## 2014-10-11 NOTE — Progress Notes (Signed)
Patient in today for first Lupron injection. Patient tolerated shot well and waited in the office for 15 minutes. Patient released with no complaints or problems.

## 2014-10-11 NOTE — Telephone Encounter (Signed)
Received request for FMLA paperwork completion. Patient made aware Dr Tomi Likens does not complete FMLA until he has seen patient's twice so that he has a baseline and follow up exam. Papers will be held until her 11/25/2014 appt. Placed in Dr Georgie Chard new patient paperwork folder. She will contact PCP if she needs this prior to follow up exam. Will call as needed.

## 2014-10-11 NOTE — Progress Notes (Signed)
Encounter reviewed by Dr. Brook Amundson C. Silva.  

## 2014-10-17 ENCOUNTER — Encounter: Payer: BLUE CROSS/BLUE SHIELD | Attending: Internal Medicine | Admitting: Dietician

## 2014-10-17 ENCOUNTER — Encounter: Payer: Self-pay | Admitting: Dietician

## 2014-10-17 VITALS — Ht 65.5 in | Wt 248.0 lb

## 2014-10-17 DIAGNOSIS — E669 Obesity, unspecified: Secondary | ICD-10-CM | POA: Diagnosis not present

## 2014-10-17 DIAGNOSIS — E785 Hyperlipidemia, unspecified: Secondary | ICD-10-CM | POA: Diagnosis not present

## 2014-10-17 DIAGNOSIS — Z0279 Encounter for issue of other medical certificate: Secondary | ICD-10-CM

## 2014-10-17 DIAGNOSIS — Z713 Dietary counseling and surveillance: Secondary | ICD-10-CM | POA: Diagnosis not present

## 2014-10-17 DIAGNOSIS — R7303 Prediabetes: Secondary | ICD-10-CM

## 2014-10-17 DIAGNOSIS — Z6841 Body Mass Index (BMI) 40.0 and over, adult: Secondary | ICD-10-CM | POA: Insufficient documentation

## 2014-10-17 DIAGNOSIS — R7309 Other abnormal glucose: Secondary | ICD-10-CM | POA: Insufficient documentation

## 2014-10-17 NOTE — Progress Notes (Signed)
Medical Nutrition Therapy:  Appt start time: 0930 end time:  1030.   Assessment:  Primary concerns today: Patient is here alone.  She is here due to hyperlipidemia, low HDL and obesity.  She would like to lose weight and stated that her HDL was improved with weight loss.  She also states that she could better tolerate exercise with weight loss.  Labs also not a HgbA1C of 5.8% which meets criteria for prediabetes.  She states that most people in her family have diabetes. Her highest weight is current weight of 248 lbs and her lowest adult weight was 150 lbs.  She states that she gained 60 lbs when put on depo and weight has gradually increased since that time.  Hx also includes Migraines and one trigger is artificial sweeteners.    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  She has problems sleeping often.  She is considering going back to school for her master's degree.  She lives with her 62 and 45 yo sons and does all of the shopping and cooking.  She tends to eat more high carbohydrate meals when she is with them.  Currently she is not exercising.  Preferred Learning Style:   No preference indicated   Learning Readiness:   Ready  Change in progress  MEDICATIONS: see list   DIETARY INTAKE: Artificial sweeteners and cheese cause migraines.  Eats a lot of gum.  Dislikes chocolate but likes sour candy.  Eats "a lot of bread".  24-hr recall:  B ( AM): greek yogurt with banana and walnuts, coffee with cream (3 T)  Snk ( AM): none L ( PM):  Baked chicken, spinach, corn Snk ( PM): when home:  Chips, pretzels, popcorn, fruit, NABS "not hungry, probably board D ( PM): Calzone or chicken with starch and veges or just popcorn Snk ( PM): similar to afternoon Beverages: regular soda, water, hot tea with honey, coffee with cream  Usual physical activity: ADL's.  Planters facientis  with walking.  She would like to bike or zumba.  Will ride with boys at [facility].   Estimated energy needs: 1800 calories 90-100 g protein  Progress Towards Goal(s):  In progress.   Nutritional Diagnosis:  NB-1.1 Food and nutrition-related knowledge deficit As related to weight loss and nutritional recommendations for hyperlipidemia.  As evidenced by patient report.    Intervention:  Nutrition education/counsling regarding nutrition for hyperlipidemia and weight loss as well as importance of exercise to lose weight, increase HDL, and benefit overall health.  Discussed the Mediterranean diet, benefits of increasing non-starchy vegetables and tips for meal planning.  She eats from boredom/fatigue.  Discussed intuitive eating.  Be as active as possible.  Biking is great!  Aim for 30 minutes 5 times per week.   Choose unsaturated fats (nuts, seeds, olives) Bake, broil, grill rather than fry more often.   Increase amounts of non starchy vegetables. Avoid sugar sweetened beverages and juice.  Small amounts whole grains.   Be mindful when you eat.  Are you hungry or eating because of another reason?  Teaching Method Utilized:  Visual Auditory Hands on  Handouts given during visit include:  Weight Management Resources  Mediterranean diet  Snacks  Breakfast  Meal plan card  Barriers to learning/adherence to lifestyle change: none  Demonstrated degree of understanding via:  Teach Back   Monitoring/Evaluation:  Dietary  intake, exercise, label reading, and body weight in 2 month(s).  Patient would like to be weighed on the Body composition scale at next visit.

## 2014-10-17 NOTE — Patient Instructions (Signed)
Be as active as possible.  Biking is great!  Aim for 30 minutes 5 times per week.   Choose unsaturated fats (nuts, seeds, olives) Bake, broil, grill rather than fry more often.   Increase amounts of non starchy vegetables. Avoid sugar sweetened beverages and juice.  Small amounts whole grains.   Be mindful when you eat.  Are you hungry or eating because of another reason?

## 2014-10-18 ENCOUNTER — Telehealth: Payer: Self-pay | Admitting: Internal Medicine

## 2014-10-18 NOTE — Telephone Encounter (Signed)
Olivia Mackie from [facility] called stating that the patient asked Dr. Quincy Simmonds to provide Dr. Regis Bill with information regarding her FLMA. Olivia Mackie asked that Dr. Regis Bill refer to the telephone note on 09/20/2014.

## 2014-10-18 NOTE — Telephone Encounter (Signed)
Patient had first Lupron injection here in office 10/11/14.   Routing message to Dr. Regis Bill assistant, Miles Costain.

## 2014-10-23 ENCOUNTER — Telehealth: Payer: Self-pay | Admitting: Obstetrics and Gynecology

## 2014-10-23 MED ORDER — IBUPROFEN 800 MG PO TABS: 800.0000 mg | ORAL_TABLET | Freq: Three times a day (TID) | ORAL | Status: DC | PRN

## 2014-10-23 NOTE — Telephone Encounter (Signed)
Spoke with patient. Advised of message as seen below from [place]. Patient is agreeable and verbalizes understanding. Requesting an rx for Ibuprofen 800 mg. "I do not want to have to continue taking 4 tablets every 8 hours. I would rather take one." Advised I will speak with Dr.Silva regarding medication request and return call. Patient is agreeable.  Dr.Silva, okay for Ibuprofen 800 mg rx?

## 2014-10-23 NOTE — Telephone Encounter (Signed)
Spoke with patient. Patient has a history of PCOS. Had Lupron injection on 10/11/2014. States yesterday she began to have light spotting and increased cramping/pain. States that it is intermittent and in her lower pelvic area on the right side. "This is normally what happens, but this is more intense than usual." Pain is a 7/10 when it occurs. Has also been nauseated with no vomiting. Denies any current pain at this time.Has been taking 800 mg of Ibuprofen which "makes it tolerable." Advised will need to speak with Dr.Silva regarding patients symptoms and return call with further recommendations. Patient is agreeable.

## 2014-10-23 NOTE — Telephone Encounter (Signed)
Patients may have an increase in their symptoms of pain and bleeding shortly after taking the Depo Lupron.  This is usually short lived.   This medication does something called upregulation of the receptors that control the menstrual cycle before it shuts them down.  The bleeding and pain should subside.  If pain is not tolerable, needs evaluation.

## 2014-10-23 NOTE — Telephone Encounter (Signed)
Left message to call Kaitlyn at [phone]. 

## 2014-10-23 NOTE — Telephone Encounter (Signed)
Ok to Motrin 800 mg po q 8 hours prn.  #90, RF zero.

## 2014-10-23 NOTE — Telephone Encounter (Signed)
Patient is having pain and spotting. Please call ASAP.

## 2014-10-23 NOTE — Telephone Encounter (Signed)
Returned call

## 2014-10-25 NOTE — Telephone Encounter (Signed)
Spoke with patient. Advised patient rx for Motrin 800 mg po q 8 hours prn #90 0RF has been sent to pharmacy on file. Patient is agreeable. States she is still having bleeding and cramping but it has decreased. Advised to continue to monitor. If symptoms worsen or she develops new symptoms will need to return call to office to let Dr.Silva know. Patient is agreeable.  Routing to provider for final review. Patient agreeable to disposition. Will close encounter.

## 2014-11-08 ENCOUNTER — Ambulatory Visit: Payer: BLUE CROSS/BLUE SHIELD

## 2014-11-13 ENCOUNTER — Ambulatory Visit (INDEPENDENT_AMBULATORY_CARE_PROVIDER_SITE_OTHER): Payer: BLUE CROSS/BLUE SHIELD | Admitting: *Deleted

## 2014-11-13 VITALS — BP 108/66 | HR 72 | Resp 16 | Ht 65.5 in | Wt 244.0 lb

## 2014-11-13 DIAGNOSIS — D251 Intramural leiomyoma of uterus: Secondary | ICD-10-CM | POA: Diagnosis not present

## 2014-11-13 MED ORDER — LEUPROLIDE ACETATE 3.75 MG IM KIT
3.7500 mg | PACK | Freq: Once | INTRAMUSCULAR | Status: AC
  Administered 2014-11-13: 3.75 mg via INTRAMUSCULAR

## 2014-11-13 NOTE — Progress Notes (Signed)
Patient in today for second Lupron Injection.  Patient tolerated shot well. Will call if any concerns.

## 2014-11-14 NOTE — Progress Notes (Signed)
Encounter reviewed by Dr. Addeline Calarco Amundson C. Silva.  

## 2014-11-19 ENCOUNTER — Ambulatory Visit: Payer: BLUE CROSS/BLUE SHIELD | Admitting: Dietician

## 2014-11-25 ENCOUNTER — Ambulatory Visit: Payer: BLUE CROSS/BLUE SHIELD | Admitting: Neurology

## 2014-11-26 ENCOUNTER — Ambulatory Visit: Payer: BLUE CROSS/BLUE SHIELD | Admitting: Neurology

## 2014-12-09 ENCOUNTER — Telehealth: Payer: Self-pay | Admitting: Obstetrics and Gynecology

## 2014-12-09 ENCOUNTER — Telehealth: Payer: Self-pay | Admitting: Neurology

## 2014-12-09 MED ORDER — PREDNISONE 10 MG PO TABS: ORAL_TABLET | ORAL | Status: DC

## 2014-12-09 NOTE — Telephone Encounter (Signed)
Spoke with patient. Has had headache for 6 days, has been in bed with it for 2. Patient is taking her atenolol 50 mg nightly. States that it is really helping, the headaches (exculding this one) are coming further apart and are less severe. Pt would like to know if you can prescribe her something to get rid of her headache so she doesn't miss any more work. Please advise.

## 2014-12-09 NOTE — Telephone Encounter (Signed)
PT called and said she has had a migraine for 6 days now and needs something prescribed for her/Dawn CB# [phone]

## 2014-12-09 NOTE — Telephone Encounter (Signed)
[facility] called to coordinate Depo Lupron delivery for this patient. Delivery will be on 12/11/14 for the patient's appointment 12/13/14. FYI only.

## 2014-12-09 NOTE — Telephone Encounter (Signed)
If she has somebody drive her, she can come in for a headache cocktail (Toradol 60mg Lenard Galloway 25mg /Reglan 10mg ).  Otherwise she can come in for Toradol 60mg  injection if she doesn't have a ride.    Alternatively, we can prescribe her a prednisone taper (10mg  tablets.  Take 6tabs x1day, then 5tabs x1day, then 4tabs x1day, then 3tabs x1day, then 2tabs x1day, then 1tab x1day, then STOP)

## 2014-12-09 NOTE — Telephone Encounter (Signed)
Routing to Dr.Silva as FYI. Will close encounter.

## 2014-12-09 NOTE — Telephone Encounter (Signed)
Medication sent in. Patient aware. °

## 2014-12-13 ENCOUNTER — Other Ambulatory Visit: Payer: Self-pay | Admitting: Obstetrics and Gynecology

## 2014-12-13 ENCOUNTER — Telehealth: Payer: Self-pay | Admitting: Obstetrics and Gynecology

## 2014-12-13 ENCOUNTER — Ambulatory Visit (INDEPENDENT_AMBULATORY_CARE_PROVIDER_SITE_OTHER): Payer: BLUE CROSS/BLUE SHIELD | Admitting: *Deleted

## 2014-12-13 VITALS — BP 108/76 | HR 66 | Resp 20 | Ht 65.5 in | Wt 249.0 lb

## 2014-12-13 DIAGNOSIS — D251 Intramural leiomyoma of uterus: Secondary | ICD-10-CM

## 2014-12-13 MED ORDER — LEUPROLIDE ACETATE 3.75 MG IM KIT
3.7500 mg | PACK | Freq: Once | INTRAMUSCULAR | Status: AC
  Administered 2014-12-13: 3.75 mg via INTRAMUSCULAR

## 2014-12-13 MED ORDER — LEUPROLIDE ACETATE 3.75 MG IM KIT: 3.7500 mg | PACK | Freq: Once | INTRAMUSCULAR | Status: DC

## 2014-12-13 NOTE — Telephone Encounter (Signed)
Spoke with patient. Nurse appointment scheduled for 12/16 at 2 pm for Lupron injection. Patient is aware she does not need a follow up appointment with Dr.Silva until 6 months unless she is having any problems. Patient would like to go ahead and schedule 6 month follow up. Appointment scheduled for 03/28/2015 at 12:45 pm with Dr.Silva. Agreeable to date and time.  Routing to provider for final review. Patient agreeable to disposition. Will close encounter.

## 2014-12-13 NOTE — Telephone Encounter (Signed)
Patient is ready to schedule her next Lupron injection. Patient was seen today. Patient says she orders the Lupron herself and is due in 1 month.

## 2014-12-13 NOTE — Progress Notes (Signed)
Patient in for 3rd Lupron. Tolerated shot well.  Does she need f/u appt at this point?  Please advise.

## 2014-12-13 NOTE — Telephone Encounter (Signed)
Left message to call Saia Derossett at [phone]. 

## 2014-12-13 NOTE — Telephone Encounter (Signed)
Returning a call to Kaitlyn. °

## 2014-12-13 NOTE — Telephone Encounter (Signed)
I recommend a recheck appointment after completing 6 months of Depo Lupron treatment unless patient is having a problem. Then, I will see her as needed.

## 2014-12-13 NOTE — Telephone Encounter (Signed)
[name], CMA at 12/13/2014 9:11 AM     Status: Signed       Expand All Collapse All   Patient in for 3rd Lupron. Tolerated shot well.  Does she need f/u appt at this point?  Please advise.        Patient was seen today in office for 3rd Lupron injection. Message above sent to Dr.Silva from Emelia Salis[place], [place]. Routing to [place] for review prior to scheduling patient.

## 2014-12-16 NOTE — Addendum Note (Signed)
Addended by: Yisroel Ramming, Dietrich Pates E on: 12/16/2014 07:26 PM   Modules accepted: Orders, Medications

## 2014-12-16 NOTE — Progress Notes (Signed)
Encounter reviewed by Dr. Aundria Rud. Patient will complete 6 months of Depo Lupron and then reassess.  Office visit for any problems before this as needed.

## 2015-01-01 ENCOUNTER — Ambulatory Visit (INDEPENDENT_AMBULATORY_CARE_PROVIDER_SITE_OTHER): Payer: BLUE CROSS/BLUE SHIELD

## 2015-01-01 ENCOUNTER — Other Ambulatory Visit: Payer: Self-pay | Admitting: Internal Medicine

## 2015-01-01 ENCOUNTER — Telehealth: Payer: Self-pay

## 2015-01-01 DIAGNOSIS — G43419 Hemiplegic migraine, intractable, without status migrainosus: Secondary | ICD-10-CM

## 2015-01-01 MED ORDER — KETOROLAC TROMETHAMINE 60 MG/2ML IM SOLN
60.0000 mg | Freq: Once | INTRAMUSCULAR | Status: AC
Start: 2015-01-01 — End: 2015-01-01
  Administered 2015-01-01: 60 mg via INTRAMUSCULAR

## 2015-01-01 MED ORDER — METOCLOPRAMIDE HCL 5 MG/ML IJ SOLN
10.0000 mg | Freq: Once | INTRAVENOUS | Status: AC
  Administered 2015-01-01: 10 mg via INTRAMUSCULAR

## 2015-01-01 MED ORDER — DIPHENHYDRAMINE HCL 50 MG/ML IJ SOLN
25.0000 mg | Freq: Once | INTRAMUSCULAR | Status: AC
  Administered 2015-01-01: 25 mg via INTRAMUSCULAR

## 2015-01-01 NOTE — Telephone Encounter (Signed)
Pt aware. Will be in shortly.

## 2015-01-01 NOTE — Telephone Encounter (Signed)
If she has somebody drive her, she can come in for a headache cocktail (Toradol 60mg Lenard Galloway 25mg /Reglan 10mg ). Otherwise she can come in for Toradol 60mg  injection if she doesn't have a ride.

## 2015-01-01 NOTE — Progress Notes (Signed)
Headache cocktail administered to patient in left ventogluetous. Pt tolerated well. Does have driver with her.

## 2015-01-01 NOTE — Telephone Encounter (Signed)
Pt called and requested a headache cocktail. States one was offered to her on 11/14, but she did not want to get it at that time. Patient states it is the same headache, never improved, and patient now feels it may be getting worse. Please advise.   [phone]

## 2015-01-03 NOTE — Telephone Encounter (Signed)
Sent to the pharmacy by e-scribe. 

## 2015-01-08 ENCOUNTER — Other Ambulatory Visit: Payer: Self-pay | Admitting: Obstetrics and Gynecology

## 2015-01-08 NOTE — Telephone Encounter (Signed)
Medication refill request: Ibuprofen 800mg  Last AEX:  09-19-14 Next AEX: 09-26-15 Last MMG (if hormonal medication request): 12-06-15 Refill authorized: please advise

## 2015-01-08 NOTE — Telephone Encounter (Signed)
Aygestin is used to control menopausal symptoms. Micronor is not usually used to control cramping.  I have sent through the Rx for Motrin.  Needs to be seen for a stronger medication if desires prescription medication.

## 2015-01-08 NOTE — Telephone Encounter (Signed)
Patient called and said, "I am having more cramping and pain on the Lupron than I expected. I'd like to speak with the nurse."

## 2015-01-08 NOTE — Telephone Encounter (Signed)
Spoke with patient. Patient states that since last Friday 12/9 she has been experiencing increased cramping in her stomach and lower back. Patient states she feels it is due to being close to the time she would usually start her cycle. Patient has been having Lupron injections monthly. Last injection was given on 12/13/2014. Next injection is scheduled for 01/14/2015. Has been taking 800 mg of Ibuprofen every 6 hours with no relief. Is not having any bleeding. States that Dr.Silva mentioned she may need to be on Micornor as well as have the Lupron injections to help with her cramping. Asking if this is something she needs to do at this time. Also requesting a refill of Ibuprofen 800 mg as she is almost out or something stronger for discomfort. Advised I will speak with Dr.Silva and return call with further recommendations.

## 2015-01-08 NOTE — Telephone Encounter (Signed)
Routing to Dr.Silva for review. Appears I spoke with the patient right when the message was sent. Please see note below.

## 2015-01-08 NOTE — Telephone Encounter (Signed)
Please contact patient in follow up to this phone call regarding increased pain.  I will refill the Rx for the Ibuprofen.  I see that she is due for the next Depo Lupron this month on 01/14/15. It may just be time for her next injection, so her symptoms may be increased.  Cc - Starlyn Skeans

## 2015-01-09 NOTE — Telephone Encounter (Signed)
Spoke with patient. Advised of message as seen below from [place]. Patient does not desire to take any stronger medication at this time. States she will continue to take Motrin as needed until her next Lupron injection on 12/20.  Routing to provider for final review. Patient agreeable to disposition. Will close encounter.

## 2015-01-10 ENCOUNTER — Ambulatory Visit: Payer: BLUE CROSS/BLUE SHIELD

## 2015-01-10 ENCOUNTER — Telehealth: Payer: Self-pay | Admitting: Obstetrics and Gynecology

## 2015-01-10 NOTE — Telephone Encounter (Signed)
Called patient. She states she has been trying to reach [facility] as well. She is given contact number that was provided to our office and she will contact them directly. She is advised to call us back when she authorizes shipment.

## 2015-01-10 NOTE — Telephone Encounter (Signed)
Patient called back she said her Depo Lupron is scheduled to be delivered to our office 01/14/15.

## 2015-01-10 NOTE — Telephone Encounter (Signed)
[name] from [employer] called and said, "We are having trouble reaching the patient to confirm delivery of the Lupron. I have tried to reach her multiple times and this morning someone picked up the phone and hung up on me. We do need to speak with the patient prior to shipping the medication. At this point, Tuesday is the soonest we can get this out." Patient has an appointment for Lupron on Tuesday, 01/14/15.

## 2015-01-13 ENCOUNTER — Other Ambulatory Visit: Payer: Self-pay

## 2015-01-13 MED ORDER — PROMETHAZINE HCL 12.5 MG PO TABS: 12.5000 mg | ORAL_TABLET | Freq: Four times a day (QID) | ORAL | Status: DC | PRN

## 2015-01-13 NOTE — Telephone Encounter (Signed)
Last OV: 08/09/14 Next OV: 01/30/15

## 2015-01-14 ENCOUNTER — Ambulatory Visit (INDEPENDENT_AMBULATORY_CARE_PROVIDER_SITE_OTHER): Payer: BLUE CROSS/BLUE SHIELD | Admitting: *Deleted

## 2015-01-14 VITALS — BP 118/70 | HR 72 | Resp 20 | Ht 65.5 in | Wt 252.0 lb

## 2015-01-14 DIAGNOSIS — D251 Intramural leiomyoma of uterus: Secondary | ICD-10-CM | POA: Diagnosis not present

## 2015-01-14 MED ORDER — LEUPROLIDE ACETATE 3.75 MG IM KIT
3.7500 mg | PACK | Freq: Once | INTRAMUSCULAR | Status: AC
  Administered 2015-01-14: 3.75 mg via INTRAMUSCULAR

## 2015-01-14 NOTE — Addendum Note (Signed)
Addended by: Elroy Channel on: 01/14/2015 03:59 PM   Modules accepted: Level of Service

## 2015-01-14 NOTE — Progress Notes (Signed)
Patient in today for Lupron injection. Patient denies any problems with first injection. Tolerated shot well today.   Will call for any concerns.  Encounter closed

## 2015-01-14 NOTE — Telephone Encounter (Signed)
Lupron has arrived and patient is scheduled. Will close encounter.

## 2015-01-22 ENCOUNTER — Telehealth: Payer: Self-pay | Admitting: Neurology

## 2015-01-22 NOTE — Telephone Encounter (Signed)
Patient called back. Has been taking atenolol 10 mg daily. States in the beginning she felt it was a miracle drug. However, over time the headaches have gradually returned. Pt is now on day 9 of a headache, has been waxing and waning, but never resolved. Pain level today is 5/10. Last night alone pt took 75 mg of benadryl, 1600 mg of ibprophen, and a BC powder, with some relief but did not full relieve headache. Please advise.

## 2015-01-22 NOTE — Telephone Encounter (Signed)
1.  Increase atenolol to 100mg  daily. 2.  We can prescribe her tramadol 50mg  to take when she gets a severe headache.  She may take every 6 hours but limited to no more than 2 days out of the week (should not take any pain relievers more than 2 days out of the week)

## 2015-01-22 NOTE — Telephone Encounter (Signed)
Left message on machine for pt to return call to the office.  

## 2015-01-22 NOTE — Telephone Encounter (Signed)
Pt needs to talk to someone about medication dosage change she is still having migraine please [phone]

## 2015-01-23 MED ORDER — ATENOLOL 100 MG PO TABS: 100.0000 mg | ORAL_TABLET | Freq: Every day | ORAL | Status: DC

## 2015-01-23 MED ORDER — TRAMADOL HCL 50 MG PO TABS: 50.0000 mg | ORAL_TABLET | Freq: Four times a day (QID) | ORAL | Status: DC | PRN

## 2015-01-23 NOTE — Telephone Encounter (Signed)
Left message on machine at pt's request yesterday. Prescriptions sent in.

## 2015-01-30 ENCOUNTER — Ambulatory Visit: Payer: BLUE CROSS/BLUE SHIELD | Admitting: Neurology

## 2015-01-30 ENCOUNTER — Ambulatory Visit (INDEPENDENT_AMBULATORY_CARE_PROVIDER_SITE_OTHER): Payer: BLUE CROSS/BLUE SHIELD | Admitting: Neurology

## 2015-01-30 ENCOUNTER — Encounter: Payer: Self-pay | Admitting: Neurology

## 2015-01-30 VITALS — BP 124/82 | HR 81 | Ht 65.0 in | Wt 253.0 lb

## 2015-01-30 DIAGNOSIS — G43419 Hemiplegic migraine, intractable, without status migrainosus: Secondary | ICD-10-CM | POA: Diagnosis not present

## 2015-01-30 NOTE — Progress Notes (Signed)
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  UPDATE: Headaches were well-controlled until 3 weeks ago.  She no longer has the hemiplegic migraines, but rather her classic migraines with headache, nausea, photophobia and phonophobia.  Despite headache cocktail and prednisone taper, headaches have persisted.  They occur about 15 days over past month, six days she needed to stay home from work. Current abortive medication:  Tramadol 50mg  (effective.  She took it 3 times but she had a reaction after the third time.  She felt numbness and tingling in hands and feet and felt jittery and couldn't fall asleep), Phenergan 12.5mg  Antihypertensive medications:  atenolol 100mg  (increased from 50mg  last week) Antidepressant medications:  celexa Anticonvulsant medications:  none  Due to persistent left sided weakness and pain, MRI of brain was performed on 09/05/14, which was normal.  HISTORY: Onset:  Classic migraines started age 77.  Current atypical migraines started in 2010 Location: bi-frontal Quality:  pressure Initial Intensity:  dull Aura:  Word-finding problems, slowed speech Prodrome:  none Associated symptoms:  Nausea, pain and numbness involving the left side of face, arm and leg.  Associated weakness/heaviness of the left arm and leg. Initial Duration:  8-10 days Initial Frequency:  She always has some degree of pain in the left side but with fluctuations in weakness, pain and speech difficulty  Classic migraines are pounding bi-frontal headaches associated with nausea and vomiting.  She hasn't had those since around 2010.  Past abortive medication:  Imitrex, Zomig, ibuprofen, BC, naproxen, Excedrin, Tylenol Past preventative medication:  Amitriptyline, topiramate, Depakote  She was previously  worked up in August 2010.  MRI of brain with and without contrast was normal.  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    Caffeine:  Just stopped Smoker:  no Exercise:  Not routine Depression/stress:  stress Sleep hygiene:  poor Family history of headache:  Father, siblings, children  PAST MEDICAL HISTORY: Past Medical History  Diagnosis Date  . Anxiety   . Chest pain     neg stress test summer 2010  . Migraines   . Endometriosis   . Sinusitis   . Ovarian torsion   . Allergic rhinitis     hx of testing and allergic to spring and fall f= pollens  . Anemia     nos  . GERD (gastroesophageal reflux disease)   . Endometriosis   . Hyperlipidemia   . Headache(784.0)   . Labial cyst 04/08/2011    early infection use hotpcompresses    . Thyroid disease     goiter  . STD (sexually transmitted disease)     Pos. Chlamydia--in her 16's  . Goiter   . Irritable bowel syndrome   . Fibroids   . Hypertension     MEDICATIONS: Current Outpatient Prescriptions on File Prior to Visit  Medication Sig Dispense Refill  . atenolol (TENORMIN) 100 MG tablet Take 1 tablet (100 mg total) by mouth daily. 30 tablet 1  . citalopram (CELEXA) 20 MG tablet TAKE 1 TABLET BY MOUTH ONCE DAILY 120 tablet 1  . Fish Oil OIL by Does not apply route.    . fluticasone (FLONASE) 50 MCG/ACT nasal spray Place 2 sprays into both nostrils daily. (Patient taking differently: Place 2 sprays  into both nostrils daily as needed for allergies. ) 1 g prn  . ibuprofen (ADVIL,MOTRIN) 800 MG tablet TAKE 1 TABLET(800 MG) BY MOUTH EVERY 8 HOURS AS NEEDED 90 tablet 0  . loratadine (CLARITIN) 10 MG tablet Take 10 mg by mouth daily as needed for allergies.     Marland Kitchen LUPRON DEPOT 3.75 MG injection Inject 3.75 mg into the muscle every 30 (thirty) days.  5  . Multiple Vitamin (MULTIVITAMIN WITH MINERALS) TABS tablet Take 1 tablet by mouth  daily.    . pantoprazole (PROTONIX) 40 MG tablet Take 1 tablet (40 mg total) by mouth daily. 90 tablet 3  . promethazine (PHENERGAN) 12.5 MG tablet Take 1 tablet (12.5 mg total) by mouth every 6 (six) hours as needed for nausea or vomiting. 30 tablet 1  . triamterene-hydrochlorothiazide (MAXZIDE-25) 37.5-25 MG per tablet Take 1 tablet by mouth daily. 90 tablet 3  . TURMERIC PO Take by mouth.    . traMADol (ULTRAM) 50 MG tablet Take 1 tablet (50 mg total) by mouth every 6 (six) hours as needed. She may take every 6 hours but limited to no more than 2 days out of the week 32 tablet 1   No current facility-administered medications on file prior to visit.    ALLERGIES: Allergies  Allergen Reactions  . Codeine Phosphate Hives  . Moxifloxacin     REACTION: cause strange sensation, joint pain. Could not get thumbs to move. Stiffness.  . Sulfamethoxazole Hives    FAMILY HISTORY: Family History  Problem Relation Age of Onset  . Anxiety disorder Mother     mother is on xanax and apparently applying for  social security diabilty for her anxiety. t  . Depression Mother   . Diabetes Mother   . Hypertension Mother   . Coronary artery disease    . Diabetes    . Hyperlipidemia    . Other      history of child death in family   . Heart attack Father   . Seizures Father   . Stroke Maternal Grandmother   . Diabetes Paternal Grandmother   . Hypertension Paternal Grandmother   . Colon cancer Neg Hx   . Stomach cancer Neg Hx   . Esophageal cancer Neg Hx     SOCIAL HISTORY: Social History   Social History  . Marital Status: Single    Spouse Name: N/A  . Number of Children: N/A  . Years of Education: N/A   Occupational History  . Not on file.   Social History Main Topics  . Smoking status: Never Smoker   . Smokeless tobacco: Never Used  . Alcohol Use: No  . Drug Use: No  . Sexual Activity:    Partners: Male    Birth Control/ Protection: Surgical     Comment: Tubal   Other  Topics Concern  . 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   And graduated as SW.      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     Independence: Constitutional: No fevers, chills, or sweats, no generalized fatigue, change in appetite Eyes: No visual changes, double vision, eye pain Ear, nose and throat: No hearing loss, ear pain, nasal congestion, sore throat Cardiovascular: No chest pain, palpitations Respiratory:  No shortness of breath at rest or with exertion, wheezes GastrointestinaI: No nausea, vomiting, diarrhea, abdominal pain, fecal incontinence Genitourinary:  No dysuria, urinary retention or frequency Musculoskeletal:  No neck pain, back pain Integumentary: No rash, pruritus, skin lesions Neurological: as above Psychiatric: No depression, insomnia, anxiety Endocrine: No palpitations, fatigue, diaphoresis, mood swings, change in appetite, change in weight, increased thirst Hematologic/Lymphatic:  No anemia, purpura, petechiae. Allergic/Immunologic: no itchy/runny eyes, nasal congestion, recent allergic reactions, rashes  PHYSICAL EXAM: Filed Vitals:   01/30/15 1326  BP: 124/82  Pulse: 81   General: No acute distress.  Patient appears well-groomed.  obese body habitus. Head:  Normocephalic/atraumatic Eyes:  Fundoscopic exam unremarkable without vessel changes, exudates, hemorrhages or papilledema. Neck: supple, no paraspinal tenderness, full range of motion Heart:  Regular rate and rhythm Lungs:  Clear to  auscultation bilaterally Back: No paraspinal tenderness Neurological Exam: alert and oriented to person, place, and time. Attention span and concentration intact, recent and remote memory intact, fund of knowledge intact.  Speech fluent and not dysarthric, language intact.  CN II-XII intact. Fundoscopic exam unremarkable without vessel changes, exudates, hemorrhages or papilledema.  Bulk and tone normal, muscle strength 5/5 throughout.  Sensation to light touch, temperature and vibration intact.  Deep tendon reflexes 2+ throughout.  Finger to nose and heel to shin testing intact.  Gait normal.  IMPRESSION: Migraine without aura  PLAN: 1.  Since she just increased atenolol last week, will have her continue for another 3 weeks.  If headaches not improved, will switch to another agent such as venlafaxine 2.  She will try Tramadol one more time.  If she has a reaction, we will switch to [place].  I want to avoid triptans due to history of hemiplegic migraines 3.  Weight loss 4.  Follow up in 3 months.  15 minutes spent face to face with patient, over 50% spent discussing management.  Metta Clines, DO  CC:  [name], MD

## 2015-01-30 NOTE — Patient Instructions (Signed)
Migraine Recommendations: 1.  Continue atenolol 100mg  daily.  Call in 3 weeks with update and we can switch to another medication if needed. 2.  Try the tramadol one more time.  If you still get a reaction, call and we can try something else 3.  Limit use of pain relievers to no more than 2 days out of the week.  These medications include acetaminophen, ibuprofen, triptans and narcotics.  This will help reduce risk of rebound headaches. 4.  Be aware of common food triggers such as processed sweets, processed foods with nitrites (such as deli meat, hot dogs, sausages), foods with MSG, alcohol (such as wine), chocolate, certain cheeses, certain fruits (dried fruits, some citrus fruit), vinegar, diet soda. 4.  Avoid caffeine 5.  Routine exercise 6.  Proper sleep hygiene 7.  Stay adequately hydrated with water 8.  Keep a headache diary. 9.  Maintain proper stress management. 10.  Do not skip meals. 11.  Consider supplements:  Magnesium oxide 400mg  to 600mg  daily, riboflavin 400mg , Coenzyme Q 10 100mg  three times daily 12.  FOllow up in 3 months.

## 2015-02-12 ENCOUNTER — Other Ambulatory Visit: Payer: Self-pay

## 2015-02-12 ENCOUNTER — Telehealth: Payer: Self-pay | Admitting: Obstetrics and Gynecology

## 2015-02-12 DIAGNOSIS — Z1231 Encounter for screening mammogram for malignant neoplasm of breast: Secondary | ICD-10-CM

## 2015-02-12 NOTE — Telephone Encounter (Signed)
[facility] called to confirm delivery details. I confirmed our address with them. The medication will be delivered on Tuesday, 02/18/15.

## 2015-02-12 NOTE — Telephone Encounter (Signed)
Call to patient and nurse appointment scheduled for 02/19/15 for Lupron injection.  Advised patient will call and move up appointment if Lupron arrives any earlier than 02/18/15 as scheduled. Patient agreeable. Denies current complaints.  Routing to provider for final review. Patient agreeable to disposition. Will close encounter.

## 2015-02-12 NOTE — Telephone Encounter (Signed)
Patient is calling to schedule her appointment for Lupron.

## 2015-02-19 ENCOUNTER — Ambulatory Visit (INDEPENDENT_AMBULATORY_CARE_PROVIDER_SITE_OTHER): Payer: BLUE CROSS/BLUE SHIELD

## 2015-02-19 VITALS — BP 118/72 | HR 64 | Resp 16 | Wt 252.0 lb

## 2015-02-19 DIAGNOSIS — D251 Intramural leiomyoma of uterus: Secondary | ICD-10-CM | POA: Diagnosis not present

## 2015-02-19 MED ORDER — LEUPROLIDE ACETATE 3.75 MG IM KIT
3.7500 mg | PACK | Freq: Once | INTRAMUSCULAR | Status: AC
  Administered 2015-02-19: 3.75 mg via INTRAMUSCULAR

## 2015-02-19 NOTE — Progress Notes (Signed)
   Patient in today for Lupron injection. Patient denies any problems with first injection. Tolerated shot well today in Right gluteus.   Will call for any concerns.  Encounter closed

## 2015-03-03 ENCOUNTER — Ambulatory Visit: Payer: BLUE CROSS/BLUE SHIELD

## 2015-03-10 ENCOUNTER — Emergency Department (HOSPITAL_COMMUNITY): Payer: BLUE CROSS/BLUE SHIELD

## 2015-03-10 ENCOUNTER — Emergency Department (HOSPITAL_COMMUNITY)
Admission: EM | Admit: 2015-03-10 | Discharge: 2015-03-10 | Disposition: A | Discharge status: Home / Self Care | Payer: BLUE CROSS/BLUE SHIELD | Attending: Emergency Medicine | Admitting: Emergency Medicine

## 2015-03-10 ENCOUNTER — Encounter (HOSPITAL_COMMUNITY): Payer: Self-pay | Admitting: Emergency Medicine

## 2015-03-10 DIAGNOSIS — S40012A Contusion of left shoulder, initial encounter: Secondary | ICD-10-CM

## 2015-03-10 DIAGNOSIS — S4992XA Unspecified injury of left shoulder and upper arm, initial encounter: Secondary | ICD-10-CM | POA: Diagnosis present

## 2015-03-10 DIAGNOSIS — F419 Anxiety disorder, unspecified: Secondary | ICD-10-CM | POA: Insufficient documentation

## 2015-03-10 DIAGNOSIS — G43909 Migraine, unspecified, not intractable, without status migrainosus: Secondary | ICD-10-CM | POA: Diagnosis not present

## 2015-03-10 DIAGNOSIS — Z8639 Personal history of other endocrine, nutritional and metabolic disease: Secondary | ICD-10-CM | POA: Diagnosis not present

## 2015-03-10 DIAGNOSIS — Y998 Other external cause status: Secondary | ICD-10-CM | POA: Insufficient documentation

## 2015-03-10 DIAGNOSIS — Z8742 Personal history of other diseases of the female genital tract: Secondary | ICD-10-CM | POA: Insufficient documentation

## 2015-03-10 DIAGNOSIS — Y9289 Other specified places as the place of occurrence of the external cause: Secondary | ICD-10-CM | POA: Insufficient documentation

## 2015-03-10 DIAGNOSIS — Z86018 Personal history of other benign neoplasm: Secondary | ICD-10-CM | POA: Insufficient documentation

## 2015-03-10 DIAGNOSIS — Z862 Personal history of diseases of the blood and blood-forming organs and certain disorders involving the immune mechanism: Secondary | ICD-10-CM | POA: Diagnosis not present

## 2015-03-10 DIAGNOSIS — W01198A Fall on same level from slipping, tripping and stumbling with subsequent striking against other object, initial encounter: Secondary | ICD-10-CM | POA: Diagnosis not present

## 2015-03-10 DIAGNOSIS — Y9389 Activity, other specified: Secondary | ICD-10-CM | POA: Insufficient documentation

## 2015-03-10 DIAGNOSIS — Z79899 Other long term (current) drug therapy: Secondary | ICD-10-CM | POA: Insufficient documentation

## 2015-03-10 DIAGNOSIS — Z8709 Personal history of other diseases of the respiratory system: Secondary | ICD-10-CM | POA: Insufficient documentation

## 2015-03-10 DIAGNOSIS — K219 Gastro-esophageal reflux disease without esophagitis: Secondary | ICD-10-CM | POA: Insufficient documentation

## 2015-03-10 DIAGNOSIS — I10 Essential (primary) hypertension: Secondary | ICD-10-CM | POA: Diagnosis not present

## 2015-03-10 DIAGNOSIS — Z8619 Personal history of other infectious and parasitic diseases: Secondary | ICD-10-CM | POA: Insufficient documentation

## 2015-03-10 MED ORDER — OXYCODONE-ACETAMINOPHEN 5-325 MG PO TABS
1.0000 | ORAL_TABLET | Freq: Once | ORAL | Status: AC
  Administered 2015-03-10: 1 via ORAL
  Filled 2015-03-10: qty 1

## 2015-03-10 MED ORDER — OXYCODONE-ACETAMINOPHEN 5-325 MG PO TABS: 2.0000 | ORAL_TABLET | ORAL | Status: DC | PRN

## 2015-03-10 NOTE — Discharge Instructions (Signed)
Follow up with orthopedic provider if your symptoms do not improve. Apply ice to affected area. Keep arm in sling until healed. Return to the ED if you experience severe worsening of your symptoms, redness or warmth of joint, numbness or tingling in extremity, fevers.

## 2015-03-10 NOTE — ED Provider Notes (Signed)
CSN: [id number]     Arrival date & time 03/10/15  1059 History  By signing my name below, I, Rayna Sexton, attest that this documentation has been prepared under the direction and in the presence of [employer], PA-C. Electronically Signed: Rayna Sexton, ED Scribe. 03/10/2015. 12:47 PM.    Chief Complaint  Patient presents with  . Shoulder Pain  . Fall   The history is provided by the patient. No language interpreter was used.    HPI Comments: [name] is a 45 y.o. female who presents to the Emergency Department complaining of a fall that occurred 3 hours ago. She notes experiencing mild dizziness due to having a migraine for the past 2 days which she says is nml further noting that when bending over in the shower lost her balance and fell backwards onto her left shoulder. She notes associated, moderate, left shoulder pain that worsens with any movement. Pt denies taking any medication since the fall noting that she took Tramadol prior to the fall for her migraine. Pt denies LOC, head trauma, numbness, tingling or any other associated symptoms at this time.   Past Medical History  Diagnosis Date  . Anxiety   . Chest pain     neg stress test summer 2010  . Migraines   . Endometriosis   . Sinusitis   . Ovarian torsion   . Allergic rhinitis     hx of testing and allergic to spring and fall f= pollens  . Anemia     nos  . GERD (gastroesophageal reflux disease)   . Endometriosis   . Hyperlipidemia   . Headache(784.0)   . Labial cyst 04/08/2011    early infection use hotpcompresses    . Thyroid disease     goiter  . STD (sexually transmitted disease)     Pos. Chlamydia--in her 47's  . Goiter   . Irritable bowel syndrome   . Fibroids   . Hypertension    Past Surgical History  Procedure Laterality Date  . Tubal ligation    . Unilateral salpingectomy      right  age 40 ectopics.  . Cesarean section  2002, 2004    x2  . Pelvic laparoscopy      x9  .  Radiology with anesthesia N/A 09/05/2014    Procedure: MRI OF BRAIN WITHOUT CONTRAST;  Surgeon: Medication Radiologist, MD;  Location: [facility];  Service: Radiology;  Laterality: N/A;  MRI/DR. JAFFE   Family History  Problem Relation Age of Onset  . Anxiety disorder Mother     mother is on xanax and apparently applying for  social security diabilty for her anxiety. t  . Depression Mother   . Diabetes Mother   . Hypertension Mother   . Coronary artery disease    . Diabetes    . Hyperlipidemia    . Other      history of child death in family   . Heart attack Father   . Seizures Father   . Stroke Maternal Grandmother   . Diabetes Paternal Grandmother   . Hypertension Paternal Grandmother   . Colon cancer Neg Hx   . Stomach cancer Neg Hx   . Esophageal cancer Neg Hx    Social History  Substance Use Topics  . Smoking status: Never Smoker   . Smokeless tobacco: Never Used  . Alcohol Use: No   OB History    Gravida Para Term Preterm AB TAB SAB Ectopic Multiple Living  7 2   5 1 1 3  2       Obstetric Comments   3 ectopic     Review of Systems A complete 10 system review of systems was obtained and all systems are negative except as noted in the HPI and PMH.   Allergies  Codeine phosphate; Moxifloxacin; and Sulfamethoxazole  Home Medications   Prior to Admission medications   Medication Sig Start Date End Date Taking? Authorizing Provider  atenolol (TENORMIN) 100 MG tablet Take 1 tablet (100 mg total) by mouth daily. 01/23/15   Pieter Partridge, DO  citalopram (CELEXA) 20 MG tablet TAKE 1 TABLET BY MOUTH ONCE DAILY 01/03/15   Burnis Medin, MD  Fish Oil OIL by Does not apply route.    Historical Provider, MD  fluticasone (FLONASE) 50 MCG/ACT nasal spray Place 2 sprays into both nostrils daily. Patient taking differently: Place 2 sprays into both nostrils daily as needed for allergies.  01/17/14   Burnis Medin, MD  ibuprofen (ADVIL,MOTRIN) 800 MG tablet TAKE 1 TABLET(800 MG) BY  MOUTH EVERY 8 HOURS AS NEEDED 01/08/15   Nunzio Cobbs, MD  loratadine (CLARITIN) 10 MG tablet Take 10 mg by mouth daily as needed for allergies.     Historical Provider, MD  LUPRON DEPOT 3.75 MG injection Inject 3.75 mg into the muscle every 30 (thirty) days. 11/11/14   Historical Provider, MD  Multiple Vitamin (MULTIVITAMIN WITH MINERALS) TABS tablet Take 1 tablet by mouth daily.    Historical Provider, MD  pantoprazole (PROTONIX) 40 MG tablet Take 1 tablet (40 mg total) by mouth daily. 02/20/14   Laban Emperor Zehr, PA-C  promethazine (PHENERGAN) 12.5 MG tablet Take 1 tablet (12.5 mg total) by mouth every 6 (six) hours as needed for nausea or vomiting. 01/13/15   Pieter Partridge, DO  traMADol (ULTRAM) 50 MG tablet Take 1 tablet (50 mg total) by mouth every 6 (six) hours as needed. She may take every 6 hours but limited to no more than 2 days out of the week 01/23/15   Pieter Partridge, DO  triamterene-hydrochlorothiazide (MAXZIDE-25) 37.5-25 MG per tablet Take 1 tablet by mouth daily. 09/25/14   Burnis Medin, MD  TURMERIC PO Take by mouth.    Historical Provider, MD   BP 134/75 mmHg  Pulse 70  Temp(Src) 97.9 F (36.6 C) (Oral)  SpO2 100%    Physical Exam  Constitutional: She is oriented to person, place, and time. She appears well-developed and well-nourished.  HENT:  Head: Normocephalic and atraumatic.  Eyes: EOM are normal.  Neck: Normal range of motion.  Cardiovascular: Normal rate and intact distal pulses.   Pulmonary/Chest: Effort normal. No respiratory distress.  Abdominal: Soft.  Musculoskeletal: Normal range of motion.  No bony TTP over left shoulder. Pain felt with flexion and extension. No obvious bony deformity. Intact distal pulses.     Neurological: She is alert and oriented to person, place, and time.  Strength 5/5 throughout. No sensory deficits.    Skin: Skin is warm and dry.  Psychiatric: She has a normal mood and affect.  Nursing note and vitals  reviewed.   ED Course  Procedures  DIAGNOSTIC STUDIES: Oxygen Saturation is 100% on RA, normal by my interpretation.    COORDINATION OF CARE: 12:44 PM Discussed next steps with pt including DG of the left shoulder and reevaluation based on imaging results. Pt verbalized understanding and is agreeable with the plan.   Labs Review Labs  Reviewed - No data to display  Imaging Review Dg Shoulder Left  03/10/2015  CLINICAL DATA:  Left shoulder pain.  Status post fall. EXAM: LEFT SHOULDER - 2+ VIEW COMPARISON:  None. FINDINGS: There is no evidence of fracture or dislocation. There is no evidence of arthropathy or other focal bone abnormality. Soft tissues are unremarkable. IMPRESSION: Negative. Electronically Signed   By: Kathreen Devoid   On: 03/10/2015 13:06   I have personally reviewed and evaluated these images as part of my medical decision-making.   EKG Interpretation None      MDM   Final diagnoses:  Shoulder contusion, left, initial encounter    Patient X-Ray negative for obvious fracture or dislocation.  Pt advised to follow up with orthopedics. Patient given shoulder immobilizer while in ED, conservative therapy recommended and discussed. Patient will be discharged home & is agreeable with above plan. Returns precautions discussed. Pt appears safe for discharge.  I personally performed the services described in this documentation, which was scribed in my presence. The recorded information has been reviewed and is accurate.      Dondra Spry Shedd, PA-C 03/10/15 [place], MD 03/10/15 [phone]

## 2015-03-10 NOTE — ED Notes (Signed)
Pt reports hx of migraine and "I sometimes get dizzy with my migraines". Today while in the shower she bent over to pick up the soap and fell hitting her left shoulder. Pt has left shoulder pain with increase pain when moving arm. Denies LOC or hitting head. Pt ambulatory.

## 2015-03-13 ENCOUNTER — Telehealth: Payer: Self-pay | Admitting: Neurology

## 2015-03-13 NOTE — Telephone Encounter (Signed)
Pt called to update on current medication, doing about the same, was dizzy and fell in shower, at least 2 migraines a week/Dawn [phone]

## 2015-03-17 ENCOUNTER — Ambulatory Visit: Payer: BLUE CROSS/BLUE SHIELD

## 2015-03-17 NOTE — Telephone Encounter (Signed)
Left VM and sent Mychart message

## 2015-03-17 NOTE — Telephone Encounter (Signed)
Pt called with update on atenolol 100 mg QHS. Still having migraines, states the frequency and intensity still about the same, duration may be slightly better. Did fall in shower due to dizziness. No other ailments at time of fall. Dizziness did not persist. Please advise.

## 2015-03-17 NOTE — Telephone Encounter (Signed)
She can decrease dose of atenolol back to 50mg  daily since there is no change.  I would like to add another medication, specifically Cymbalta 30mg  daily.  I would like an update in one month.

## 2015-03-19 ENCOUNTER — Encounter: Payer: Self-pay | Admitting: Neurology

## 2015-03-19 MED ORDER — DULOXETINE HCL 30 MG PO CPEP: 30.0000 mg | ORAL_CAPSULE | Freq: Every day | ORAL | Status: DC

## 2015-03-20 ENCOUNTER — Telehealth: Payer: Self-pay | Admitting: Obstetrics and Gynecology

## 2015-03-20 NOTE — Telephone Encounter (Signed)
Spoke with patient. Patient is scheduled to see Dr.Silva for follow up with Lupron on 03/28/2015 and would like to receive her injection at this appointment. Note placed on her appointment regarding Lupron injection. She is agreeable.  Routing to provider for final review. Patient agreeable to disposition. Will close encounter.

## 2015-03-20 NOTE — Telephone Encounter (Signed)
[facility] pharmacy calling to schedule delivery of lupron for patient. Scheduled arrival on Tuesday 03/25/15.

## 2015-03-25 ENCOUNTER — Telehealth: Payer: Self-pay | Admitting: *Deleted

## 2015-03-25 NOTE — Telephone Encounter (Signed)
Call to patient to notify that Lupron has arrived. Appointment is scheduled for 03-28-15.Left message to call back.

## 2015-03-25 NOTE — Telephone Encounter (Signed)
Patient returned call. Notified Lupron has arrived for appointment.   Routing to provider for final review. Will close encounter.

## 2015-03-27 ENCOUNTER — Other Ambulatory Visit: Payer: Self-pay

## 2015-03-27 NOTE — Telephone Encounter (Signed)
Medication refill request: Lupron Depot Kit 3.60m Last AEX:  09-19-14 Next AEX: 09-26-15 Last MMG (if hormonal medication request): 12-03-13 Category B; Bi-Rads 1 Neg (Next mammogram is scheduled for 04-10-15) Refill authorized: Please advise on approval or denial of rx request. Thank you.

## 2015-03-28 ENCOUNTER — Encounter: Payer: Self-pay | Admitting: Obstetrics and Gynecology

## 2015-03-28 ENCOUNTER — Ambulatory Visit (INDEPENDENT_AMBULATORY_CARE_PROVIDER_SITE_OTHER): Payer: BLUE CROSS/BLUE SHIELD | Admitting: Obstetrics and Gynecology

## 2015-03-28 VITALS — BP 112/66 | HR 76 | Resp 20 | Ht 65.5 in | Wt 253.4 lb

## 2015-03-28 DIAGNOSIS — D259 Leiomyoma of uterus, unspecified: Secondary | ICD-10-CM

## 2015-03-28 DIAGNOSIS — M25559 Pain in unspecified hip: Secondary | ICD-10-CM | POA: Diagnosis not present

## 2015-03-28 MED ORDER — LEUPROLIDE ACETATE 3.75 MG IM KIT
3.7500 mg | PACK | Freq: Once | INTRAMUSCULAR | Status: AC
  Administered 2015-03-28: 3.75 mg via INTRAMUSCULAR

## 2015-03-28 NOTE — Addendum Note (Signed)
Addended by: Lowella Fairy on: 03/28/2015 01:43 PM   Modules accepted: Orders

## 2015-03-28 NOTE — Progress Notes (Signed)
Patient ID: [name], female   DOB: [birth year], 45 y.o.   MRN: [id number] GYNECOLOGY  VISIT   HPI: 45 y.o.   Single  African American  female   [id number] with Patient's last menstrual period was 09/13/2014 (approximate).   here for 6 month follow up with Lupron.   Has taken Lupron for fibroids and pelvic pain.  No bleeding.  Still has pain from time to time.  Always has a little bit of pain.   Declines Depo Provera.  Had Mirena IUD in the past and it was difficult to remove.  Patient loved not having cycles.   Is now borderline diabetic.   Has HTN.  Migraines are under control with Dr. Tomi Likens.  GYNECOLOGIC HISTORY: Patient's last menstrual period was 09/13/2014 (approximate). Contraception:tubal Menopausal hormone therapy: n/a Last mammogram: 12-03-13 D[facility] Cat.B/Neg/BiRads1:[facility] Last pap smear: 08-31-13 Neg:Neg HR HPV        OB History    Gravida Para Term Preterm AB TAB SAB Ectopic Multiple Living   7 2   5 1 1 3  2       Obstetric Comments   3 ectopic         Patient Active Problem List   Diagnosis Date Noted  . Morbid obesity ([place]) 01/30/2015  . 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 RUQ pain 02/25/2014  . Diarrhea 02/25/2014  . Calcium oxalate crystals in urine 02/22/2014  . Intramural leiomyoma of uterus 02/22/2014  . Hx of migraine headaches 02/04/2014  . Medication management 02/04/2014  . Hx of endometriosis 02/04/2014  . Right lower quadrant abdominal pain mid also 02/04/2014  . Adjustment disorder with anxious mood 01/17/2014  . Thyroid mass 02/16/2013  . Fever, unspecified 02/15/2013  . Sinusitis 02/15/2013  . Anxiety 12/11/2011  . Stress 12/11/2011  . Irregular periods 08/17/2011  . Endometriosis 08/17/2011  . Edema 05/10/2011  . Elevated blood pressure reading 05/10/2011  . Fatigue 04/08/2011  . Protracted URI 04/08/2011  . SINUSITIS, RECURRENT 11/06/2009  . FATIGUE 02/25/2009  . PALPITATIONS  02/25/2009  . DYSPNEA 02/25/2009  . KNEE INJURY, RIGHT 12/05/2007  . HEADACHE 05/03/2007  . ADULT SITUATIONAL REACTION 11/21/2006  . SLEEPLESSNESS 11/21/2006  . CHEST PAIN 11/21/2006  . TINEA PEDIS 08/31/2006  . Other and unspecified hyperlipidemia 08/31/2006  . DISORDER, ADJUSTMENT W/ANXIETY 08/31/2006  . PLANTAR FASCIITIS 08/31/2006  . ANEMIA-NOS 06/23/2006  . MIGRAINE, COMMON 06/23/2006  . ALLERGIC RHINITIS 06/23/2006  . GERD 06/23/2006  . IRRITABLE BOWEL SYNDROME 06/23/2006    Past Medical History  Diagnosis Date  . Anxiety   . Chest pain     neg stress test summer 2010  . Migraines   . Endometriosis   . Sinusitis   . Ovarian torsion   . Allergic rhinitis     hx of testing and allergic to spring and fall f= pollens  . Anemia     nos  . GERD (gastroesophageal reflux disease)   . Endometriosis   . Hyperlipidemia   . Headache(784.0)   . Labial cyst 04/08/2011    early infection use hotpcompresses    . Thyroid disease     goiter  . STD (sexually transmitted disease)     Pos. Chlamydia--in her 42's  . Goiter   . Irritable bowel syndrome   . Fibroids   . Hypertension     Past Surgical History  Procedure Laterality Date  . Tubal ligation    . Unilateral salpingectomy  right  age 40 ectopics.  . Cesarean section  2002, 2004    x2  . Pelvic laparoscopy      x9  . Radiology with anesthesia N/A 09/05/2014    Procedure: MRI OF BRAIN WITHOUT CONTRAST;  Surgeon: Medication Radiologist, MD;  Location: [facility];  Service: Radiology;  Laterality: N/A;  MRI/DR. JAFFE    Current Outpatient Prescriptions  Medication Sig Dispense Refill  . atenolol (TENORMIN) 50 MG tablet Take 1 tablet by mouth daily.  6  . citalopram (CELEXA) 20 MG tablet TAKE 1 TABLET BY MOUTH ONCE DAILY 120 tablet 1  . DULoxetine (CYMBALTA) 30 MG capsule Take 1 capsule (30 mg total) by mouth daily. 30 capsule 3  . Fish Oil OIL by Does not apply route.    . fluticasone (FLONASE) 50 MCG/ACT nasal  spray Place 2 sprays into both nostrils daily. (Patient taking differently: Place 2 sprays into both nostrils daily as needed for allergies. ) 1 g prn  . ibuprofen (ADVIL,MOTRIN) 800 MG tablet TAKE 1 TABLET(800 MG) BY MOUTH EVERY 8 HOURS AS NEEDED 90 tablet 0  . loratadine (CLARITIN) 10 MG tablet Take 10 mg by mouth daily as needed for allergies.     Marland Kitchen LUPRON DEPOT 3.75 MG injection Inject 3.75 mg into the muscle every 30 (thirty) days.  5  . Multiple Vitamin (MULTIVITAMIN WITH MINERALS) TABS tablet Take 1 tablet by mouth daily.    . pantoprazole (PROTONIX) 40 MG tablet Take 1 tablet (40 mg total) by mouth daily. 90 tablet 3  . promethazine (PHENERGAN) 12.5 MG tablet Take 1 tablet (12.5 mg total) by mouth every 6 (six) hours as needed for nausea or vomiting. 30 tablet 1  . traMADol (ULTRAM) 50 MG tablet Take 1 tablet (50 mg total) by mouth every 6 (six) hours as needed. She may take every 6 hours but limited to no more than 2 days out of the week 32 tablet 1  . triamterene-hydrochlorothiazide (MAXZIDE-25) 37.5-25 MG per tablet Take 1 tablet by mouth daily. 90 tablet 3  . TURMERIC PO Take by mouth.     No current facility-administered medications for this visit.     ALLERGIES: Codeine phosphate; Moxifloxacin; and Sulfamethoxazole  Family History  Problem Relation Age of Onset  . Anxiety disorder Mother     mother is on xanax and apparently applying for  social security diabilty for her anxiety. t  . Depression Mother   . Diabetes Mother   . Hypertension Mother   . Coronary artery disease    . Diabetes    . Hyperlipidemia    . Other      history of child death in family   . Heart attack Father   . Seizures Father   . Stroke Maternal Grandmother   . Diabetes Paternal Grandmother   . Hypertension Paternal Grandmother   . Colon cancer Neg Hx   . Stomach cancer Neg Hx   . Esophageal cancer Neg Hx     Social History   Social History  . Marital Status: Single    Spouse Name: N/A  .  Number of Children: N/A  . Years of Education: N/A   Occupational History  . Not on file.   Social History Main Topics  . Smoking status: Never Smoker   . Smokeless tobacco: Never Used  . Alcohol Use: No  . Drug Use: No  . Sexual Activity:    Partners: Male    Birth Control/ Protection: Surgical  Comment: Tubal   Other Topics Concern  . 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   And graduated as SW.      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  PHYSICAL EXAMINATION:    BP 112/66 mmHg  Pulse 76  Resp 20  Ht 5' 5.5" (1.664 m)  Wt 253 lb 6.4 oz (114.941 kg)  BMI 41.51 kg/m2  LMP 09/13/2014 (Approximate)    General appearance: alert, cooperative and appears stated age   Pelvic: External genitalia:  no lesions              Urethra:  normal appearing urethra with no masses, tenderness or lesions              Bartholins and Skenes: normal                 Vagina: normal appearing vagina with normal color and discharge, no lesions              Cervix: no lesions          Bimanual Exam:  Uterus:  normal size, contour, position, consistency, mobility, non-tender              Adnexa: normal adnexa and no mass, fullness, tenderness             Chaperone was present for exam.  ASSESSMENT  Chronic pelvic pain.  Fibroids. HTN.  Migraines.   PLAN  Patient to receive Depo Lupron 3.75 mg IM now.  This is the last of her 6 total injections.  Discussed options for follow  up care - observation, oral Micronor or Aygestin, Depo Provera, Mirena IUD preceded by sonohysterogram, and hysterectomy with removal of remaining tube and ovary.  Brochures on hysterectomy and Mirena.  Patient will consider choices.    An After Visit Summary was printed and given to the patient.  _15_____ minutes face to face time of which over 50% was spent in counseling.

## 2015-03-28 NOTE — Patient Instructions (Signed)
Norethindrone tablets (contraception) What is this medicine? NORETHINDRONE (nor eth IN drone) is an oral contraceptive. The product contains a female hormone known as a progestin. It is used to prevent pregnancy. This medicine may be used for other purposes; ask your health care provider or pharmacist if you have questions. What should I tell my health care provider before I take this medicine? They need to know if you have any of these conditions: -blood vessel disease or blood clots -breast, cervical, or vaginal cancer -diabetes -heart disease -kidney disease -liver disease -mental depression -migraine -seizures -stroke -vaginal bleeding -an unusual or allergic reaction to norethindrone, other medicines, foods, dyes, or preservatives -pregnant or trying to get pregnant -breast-feeding How should I use this medicine? Take this medicine by mouth with a glass of water. You may take it with or without food. Follow the directions on the prescription label. Take this medicine at the same time each day and in the order directed on the package. Do not take your medicine more often than directed. Contact your pediatrician regarding the use of this medicine in children. Special care may be needed. This medicine has been used in female children who have started having menstrual periods. A patient package insert for the product will be given with each prescription and refill. Read this sheet carefully each time. The sheet may change frequently. Overdosage: If you think you have taken too much of this medicine contact a poison control center or emergency room at once. NOTE: This medicine is only for you. Do not share this medicine with others. What if I miss a dose? Try not to miss a dose. Every time you miss a dose or take a dose late your chance of pregnancy increases. When 1 pill is missed (even if only 3 hours late), take the missed pill as soon as possible and continue taking a pill each day at  the regular time (use a back up method of birth control for the next 48 hours). If more than 1 dose is missed, use an additional birth control method for the rest of your pill pack until menses occurs. Contact your health care professional if more than 1 dose has been missed. What may interact with this medicine? Do not take this medicine with any of the following medications: -amprenavir or fosamprenavir -bosentan This medicine may also interact with the following medications: -antibiotics or medicines for infections, especially rifampin, rifabutin, rifapentine, and griseofulvin, and possibly penicillins or tetracyclines -aprepitant -barbiturate medicines, such as phenobarbital -carbamazepine -felbamate -modafinil -oxcarbazepine -phenytoin -ritonavir or other medicines for HIV infection or AIDS -St. John's wort -topiramate This list may not describe all possible interactions. Give your health care provider a list of all the medicines, herbs, non-prescription drugs, or dietary supplements you use. Also tell them if you smoke, drink alcohol, or use illegal drugs. Some items may interact with your medicine. What should I watch for while using this medicine? Visit your doctor or health care professional for regular checks on your progress. You will need a regular breast and pelvic exam and Pap smear while on this medicine. Use an additional method of birth control during the first cycle that you take these tablets. If you have any reason to think you are pregnant, stop taking this medicine right away and contact your doctor or health care professional. If you are taking this medicine for hormone related problems, it may take several cycles of use to see improvement in your condition. This medicine does not protect you   against HIV infection (AIDS) or any other sexually transmitted diseases. What side effects may I notice from receiving this medicine? Side effects that you should report to your  doctor or health care professional as soon as possible: -breast tenderness or discharge -pain in the abdomen, chest, groin or leg -severe headache -skin rash, itching, or hives -sudden shortness of breath -unusually weak or tired -vision or speech problems -yellowing of skin or eyes Side effects that usually do not require medical attention (report to your doctor or health care professional if they continue or are bothersome): -changes in sexual desire -change in menstrual flow -facial hair growth -fluid retention and swelling -headache -irritability -nausea -weight gain or loss This list may not describe all possible side effects. Call your doctor for medical advice about side effects. You may report side effects to FDA at 1-800-FDA-1088. Where should I keep my medicine? Keep out of the reach of children. Store at room temperature between 15 and 30 degrees C (59 and 86 degrees F). Throw away any unused medicine after the expiration date. NOTE: This sheet is a summary. It may not cover all possible information. If you have questions about this medicine, talk to your doctor, pharmacist, or health care provider.    2016, Elsevier/Gold Standard. (2011-10-01 16:41:35)  

## 2015-04-09 ENCOUNTER — Telehealth: Payer: Self-pay | Admitting: Family Medicine

## 2015-04-09 NOTE — Telephone Encounter (Signed)
Received a fax from [name].  Pt states she has an ear infection and a migraine.  Wants antibiotics called in. Left a message for the pt to return my call.

## 2015-04-10 ENCOUNTER — Ambulatory Visit: Payer: BLUE CROSS/BLUE SHIELD

## 2015-04-11 ENCOUNTER — Other Ambulatory Visit: Payer: Self-pay | Admitting: Gastroenterology

## 2015-04-15 NOTE — Telephone Encounter (Signed)
Spoke to the pt.  Currently she has nasal congestion (clear), hoarse,  sinus pressure/pain and cough.  Production of cough is thick and clear but was green and yellow.  Denied fever.  Ongoing for 1-2 wks.  Advised an office visit but she thinks that she may be getting better.  She will call on Thursday to make an appt if needed.  Advised she could try OTC nasal spray and antihistamine to see if that will help.  She stated she is taking OTC Nasacort and generic antihistamine.  Will forward to [facility] for any further instructions.

## 2015-04-15 NOTE — Telephone Encounter (Signed)
Left a message for a return call.

## 2015-04-15 NOTE — Telephone Encounter (Signed)
I agree with above  If not  Improving after 2 weeks of illness then advise OV

## 2015-04-21 ENCOUNTER — Encounter: Payer: Self-pay | Admitting: Neurology

## 2015-04-21 NOTE — Telephone Encounter (Signed)
Please see mychart message.

## 2015-04-30 ENCOUNTER — Telehealth: Payer: Self-pay | Admitting: Neurology

## 2015-04-30 MED ORDER — DULOXETINE HCL 60 MG PO CPEP: 60.0000 mg | ORAL_CAPSULE | Freq: Every day | ORAL | Status: DC

## 2015-04-30 NOTE — Telephone Encounter (Signed)
Pt states she does not feel safe driving herself in, and she's not sure if she can find someone to bring her. She was going to contact a friend to see. Pt would like to know if there is anything we could send to pharmacy if she is unable to get a ride to the office today? Please advise.   Cymbalta 60 mg prescription sent in. Pt aware.

## 2015-04-30 NOTE — Telephone Encounter (Signed)
She can come in for a cocktail (if she has a driver) or otherwise toradol shot.  Triptans are contraindicated for her.  We can increase Cymbalta to 60mg  daily.

## 2015-04-30 NOTE — Telephone Encounter (Signed)
I would try taking the tramadol with acetaminophen.  Otherwise, I don't have anything else to give her.  As a last resort, we do use narcotics, such as Tylenol with codeine, but I do not prescribe those.

## 2015-04-30 NOTE — Telephone Encounter (Signed)
Pt has had a migraine and needs some input please call [phone]

## 2015-04-30 NOTE — Telephone Encounter (Signed)
1. What is the duration of the headaches? Started on Sunday, home from work  What pain relieves are you taking? Tramadol, not helping Are you taking a triptan (maxalt/imitrex)? Have you tried taking it along with naproxen? Doesn't take a triptan 3. What is the specific medication and dose of preventative/ daily medication she is taking? Atenolol 50 mg, Cymbalta 30 mg, tramadol 50 mg.   Pt states this is not a "headache" migraine, but one of the left sided ones that she was having when she first came to see you. Having left leg pain. Has taken Tramadol but no relief. Unsure what to do. Please advise.   Pt states that since medication change (Was on atenolol 100 mg was reduced to 50 mg and Cymbalta was added. - due to fall during migraine from dizziness) she has sad more headaches (last 4 weeks).

## 2015-05-01 MED ORDER — PROMETHAZINE HCL 12.5 MG PO TABS: 12.5000 mg | ORAL_TABLET | Freq: Four times a day (QID) | ORAL | Status: DC | PRN

## 2015-05-01 MED ORDER — TRAMADOL HCL 50 MG PO TABS: 50.0000 mg | ORAL_TABLET | Freq: Four times a day (QID) | ORAL | Status: DC | PRN

## 2015-05-01 NOTE — Telephone Encounter (Signed)
Left message on machine for pt to return call to the office. Mychart message also sent.

## 2015-05-07 ENCOUNTER — Other Ambulatory Visit: Payer: Self-pay | Admitting: Obstetrics and Gynecology

## 2015-05-07 ENCOUNTER — Other Ambulatory Visit: Payer: Self-pay | Admitting: Internal Medicine

## 2015-05-07 NOTE — Telephone Encounter (Signed)
Sent to the pharmacy by e-scribe. 

## 2015-05-07 NOTE — Telephone Encounter (Signed)
Medication refill request: Ibuprofen 800 mg Last AEX:  09/19/14 Next AEX: 09/26/2015 Last MMG (if hormonal medication request): n/a Refill authorized: Please advise

## 2015-05-13 ENCOUNTER — Encounter: Payer: Self-pay | Admitting: Neurology

## 2015-05-13 ENCOUNTER — Other Ambulatory Visit: Payer: Self-pay | Admitting: Obstetrics and Gynecology

## 2015-05-13 ENCOUNTER — Ambulatory Visit (INDEPENDENT_AMBULATORY_CARE_PROVIDER_SITE_OTHER): Payer: BLUE CROSS/BLUE SHIELD | Admitting: Neurology

## 2015-05-13 ENCOUNTER — Encounter: Payer: Self-pay | Admitting: Obstetrics and Gynecology

## 2015-05-13 ENCOUNTER — Telehealth: Payer: Self-pay | Admitting: Neurology

## 2015-05-13 VITALS — BP 118/72 | HR 86 | Ht 66.0 in | Wt 246.0 lb

## 2015-05-13 DIAGNOSIS — G43419 Hemiplegic migraine, intractable, without status migrainosus: Secondary | ICD-10-CM | POA: Diagnosis not present

## 2015-05-13 DIAGNOSIS — M25559 Pain in unspecified hip: Secondary | ICD-10-CM

## 2015-05-13 MED ORDER — TRAMADOL-ACETAMINOPHEN 37.5-325 MG PO TABS: 2.0000 | ORAL_TABLET | Freq: Four times a day (QID) | ORAL | Status: DC | PRN

## 2015-05-13 MED ORDER — ATENOLOL 25 MG PO TABS: 75.0000 mg | ORAL_TABLET | Freq: Every day | ORAL | Status: DC

## 2015-05-13 NOTE — Telephone Encounter (Signed)
Mychart message sent.

## 2015-05-13 NOTE — Telephone Encounter (Signed)
PT forgot to ask Dr Tomi Likens on his thoughts of the ear piercing for headaches, said you can call or leave a message on my chart after asking him/Dawn CB# [phone]

## 2015-05-13 NOTE — Progress Notes (Signed)
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  UPDATE: Headaches have been almost daily for over 3 months.  She has mostly hemiplegic migraine but sometimes has classic migraine.  She had to miss work 11 days in March and already 7 days in April.  When she is dizzy she cannot drive.  The lights and sounds at work exacerbate the pain.  They last anywhere from 4 hours to all day. Current abortive medication:  Tramadol 50mg , ibuprofen, Phenergan 12.5mg  Antihypertensive medications:  atenolol 50mg  (100mg  caused dizziness) Antidepressant medications:  Cymbalta 60mg , celexa Anticonvulsant medications:  none  HISTORY: Hemiplegic migraines: Onset:  Classic migraines started age 48.  Current atypical migraines started in 2010 Location: bi-frontal Quality:  pressure Initial Intensity:  dull Aura:  Word-finding problems, slowed speech Prodrome:  none Associated symptoms:  Nausea, pain and numbness involving the left side of face, arm and leg.  Associated weakness/heaviness of the left arm and leg. Initial Duration:  8-10 days Initial Frequency:  She always has some degree of pain in the left side but with fluctuations in weakness, pain and speech difficulty  Classic migraines: Started at age 22. Resolved in 2010 but returned in late 2016. Semiology are pounding bi-frontal headaches associated with nausea and vomiting.    Past abortive medication:  Imitrex, Zomig, ibuprofen, BC, naproxen, Excedrin, Tylenol Past preventative medication:  Amitriptyline, topiramate, Depakote  She was previously worked up in August 2010.  MRI of brain with and without contrast was normal.  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  Due  to persistent left sided weakness and pain, MRI of brain was performed on 09/05/14, which was normal.  Caffeine:  Just stopped Smoker:  no Exercise:  Not routine Depression/stress:  stress Sleep hygiene:  poor Family history of headache:  Father, siblings, children  PAST MEDICAL HISTORY: Past Medical History  Diagnosis Date  . Anxiety   . Chest pain     neg stress test summer 2010  . Migraines   . Endometriosis   . Sinusitis   . Ovarian torsion   . Allergic rhinitis     hx of testing and allergic to spring and fall f= pollens  . Anemia     nos  . GERD (gastroesophageal reflux disease)   . Endometriosis   . Hyperlipidemia   . Headache(784.0)   . Labial cyst 04/08/2011    early infection use hotpcompresses    . Thyroid disease     goiter  . STD (sexually transmitted disease)     Pos. Chlamydia--in her 36's  . Goiter   . Irritable bowel syndrome   . Fibroids   . Hypertension     MEDICATIONS: Current Outpatient Prescriptions on File Prior to Visit  Medication Sig Dispense Refill  . citalopram (CELEXA) 20 MG tablet TAKE 1 TABLET BY MOUTH ONCE DAILY 120 tablet 1  . Fish Oil OIL by Does not apply route.    . fluticasone (FLONASE) 50 MCG/ACT nasal spray Place 2 sprays into both nostrils daily. (Patient taking differently: Place 2 sprays into both nostrils daily as needed for allergies. ) 1 g prn  . ibuprofen (ADVIL,MOTRIN) 800 MG tablet TAKE 1 TABLET(800 MG) BY MOUTH EVERY 8 HOURS AS NEEDED 90 tablet 0  .  loratadine (CLARITIN) 10 MG tablet Take 10 mg by mouth daily as needed for allergies.     Marland Kitchen LUPRON DEPOT 3.75 MG injection Inject 3.75 mg into the muscle every 30 (thirty) days.  5  . Multiple Vitamin (MULTIVITAMIN WITH MINERALS) TABS tablet Take 1 tablet by mouth daily.    . pantoprazole (PROTONIX) 40 MG tablet TAKE 1 TABLET BY MOUTH DAILY 30 tablet 2  . promethazine (PHENERGAN) 12.5 MG tablet Take 1 tablet (12.5 mg total) by mouth every 6 (six) hours as needed for nausea or  vomiting. 30 tablet 1  . traMADol (ULTRAM) 50 MG tablet Take 1 tablet (50 mg total) by mouth every 6 (six) hours as needed. She may take every 6 hours but limited to no more than 2 days out of the week 32 tablet 2  . triamterene-hydrochlorothiazide (MAXZIDE-25) 37.5-25 MG per tablet Take 1 tablet by mouth daily. 90 tablet 3  . TURMERIC PO Take by mouth.     No current facility-administered medications on file prior to visit.    ALLERGIES: Allergies  Allergen Reactions  . Codeine Phosphate Hives  . Moxifloxacin     REACTION: cause strange sensation, joint pain. Could not get thumbs to move. Stiffness.  . Sulfamethoxazole Hives    FAMILY HISTORY: Family History  Problem Relation Age of Onset  . Anxiety disorder Mother     mother is on xanax and apparently applying for  social security diabilty for her anxiety. t  . Depression Mother   . Diabetes Mother   . Hypertension Mother   . Coronary artery disease    . Diabetes    . Hyperlipidemia    . Other      history of child death in family   . Heart attack Father   . Seizures Father   . Stroke Maternal Grandmother   . Diabetes Paternal Grandmother   . Hypertension Paternal Grandmother   . Colon cancer Neg Hx   . Stomach cancer Neg Hx   . Esophageal cancer Neg Hx     SOCIAL HISTORY: Social History   Social History  . Marital Status: Single    Spouse Name: N/A  . Number of Children: N/A  . Years of Education: N/A   Occupational History  . Not on file.   Social History Main Topics  . Smoking status: Never Smoker   . Smokeless tobacco: Never Used  . Alcohol Use: No  . Drug Use: No  . Sexual Activity:    Partners: Male    Birth Control/ Protection: Surgical     Comment: Tubal   Other Topics Concern  . 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   And graduated as SW.      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     Hamer: Constitutional: No fevers, chills, or  sweats, no generalized fatigue, change in appetite Eyes: No visual changes, double vision, eye pain Ear, nose and throat: No hearing loss, ear pain, nasal congestion, sore throat Cardiovascular: No chest pain, palpitations Respiratory:  No shortness of breath at rest or with exertion, wheezes GastrointestinaI: No nausea, vomiting, diarrhea, abdominal pain, fecal incontinence Genitourinary:  No dysuria, urinary retention or frequency Musculoskeletal:  No neck pain, back pain Integumentary: No rash, pruritus, skin lesions Neurological: as above Psychiatric: No depression, insomnia, anxiety Endocrine: No palpitations, fatigue, diaphoresis, mood swings, change in appetite, change in weight, increased thirst Hematologic/Lymphatic:  No anemia, purpura, petechiae. Allergic/Immunologic: no itchy/runny eyes, nasal congestion, recent allergic reactions, rashes  PHYSICAL EXAM: Filed Vitals:   05/13/15 1312  BP: 118/72  Pulse: 86   General: No acute distress.  Patient appears well-groomed. Head:  Normocephalic/atraumatic Eyes:  Fundoscopic exam unremarkable without vessel changes, exudates, hemorrhages or papilledema. Neck: supple, no paraspinal tenderness, full range of motion Heart:  Regular rate and rhythm Lungs:  Clear to auscultation bilaterally Back: No paraspinal tenderness Neurological Exam: alert and oriented to person, place, and time. Attention span and concentration intact, recent and remote memory intact, fund of knowledge intact.  Speech fluent and not dysarthric,  language intact.  CN II-XII intact. Fundoscopic exam unremarkable without vessel changes, exudates, hemorrhages or papilledema.  Bulk and tone normal, muscle strength 5/5 throughout.  Sensation to light touch, temperature and vibration intact.  Deep tendon reflexes 2+ throughout, toes downgoing.  Finger to nose and heel to shin testing intact.  Gait normal, Romberg negative.  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  She has over 3 months of daily headaches/migraines.  She has failed several preventatives, such as topamax, Depakote, amitriptyline, Cymbalta and atenolol.  I would like to set her up for Botox.  Side effects and benefits discussed. 2.  In the meantime, we will increase the atenolol to 75mg  daily instead of 100mg , to see if she tolerates it better. 3.  For abortive therapy, try Ultracet.  Stop ibuprofen.  Limit pain relievers to no more than 2 days out of the week.   4.  I think she should work from home.  Will fill out FMLA 5.  Follow up for Botox. 6.  She will stop Cymbalta since it was ineffective.  She was questioning about switching antidepressants.  Alternative could be Effexor or Zoloft, both which have been helpful for migraines as well.  We did discuss rare interaction for Serotonin Syndrome between tramadol and antidepressants.  We discussed that she should go to ED if she ever feels those symptoms.  26 minutes spent face to face with patient, over 50% spent discussing management.  Metta Clines, DO  CC:  [name], MD

## 2015-05-13 NOTE — Patient Instructions (Addendum)
1.  Increase atenolol to 75mg  every morning.  Will prescribe 25mg  tablets, so take 3 tablets every morning. 2.  For acute headaches, try Ultracet, 2 tablets every 6 hours.  Limit to no more than 2 days out of the week. 3.  Either stop ibuprofen, or slowly taper off by one day per week every week until off. 4.  Stop Cymbalta. 5.  Will set up for Botox 6.  I recommend that you should work from home. 7.  Follow up for first round of botox.  8.  Be aware that tramadol (which is in Ultracet) may interact with antidepressants to cause serotonin syndrome, as discussed.  It is rare but possible.  Be vigilant of symptoms and go to ED if experiencing them.

## 2015-05-14 ENCOUNTER — Telehealth: Payer: Self-pay | Admitting: Obstetrics and Gynecology

## 2015-05-14 DIAGNOSIS — Z30430 Encounter for insertion of intrauterine contraceptive device: Secondary | ICD-10-CM

## 2015-05-14 DIAGNOSIS — D259 Leiomyoma of uterus, unspecified: Secondary | ICD-10-CM

## 2015-05-14 NOTE — Telephone Encounter (Signed)
Called patient to review benefits for procedure. Left voicemail to call back and review. °

## 2015-05-16 ENCOUNTER — Telehealth: Payer: Self-pay | Admitting: Obstetrics and Gynecology

## 2015-05-16 NOTE — Telephone Encounter (Signed)
Tried to lm to r/s 09/26/15 appointment with Dr. Quincy Simmonds due to her being out of the office unable to lm vm box full/js

## 2015-05-19 NOTE — Telephone Encounter (Signed)
Attempted second call to patient. Automated message stated number is no longer in service. No DPR on file to contact other parties.

## 2015-05-23 NOTE — Telephone Encounter (Signed)
Spoke with pt regarding benefit for IUD insertion. Patient understood and agreeable. Patient advises she would like to schedule procedure the end of May. Patient asked if an ultrasound was going to be performed at the time of IUD insertion and  would also like to address birth control options until IUD is inserted.  Routing to triage for review.

## 2015-05-23 NOTE — Telephone Encounter (Signed)
Attempt to call patient. Voice mail not set up. Unable to leave message.

## 2015-05-26 NOTE — Telephone Encounter (Signed)
Spoke with patient. Patient states that she would like to schedule her IUD insertion for the end of May. Asking if she will need to have an ultrasound performed at the time she has her IUD inserted. Advised I will speak with Dr.Silva and return call with recommendations. Patient would also like to start on oral contraception to help control her cycles prior to her IUD insertion. Patient had her last Depo Lupron injection on 03/28/2015. States she would like to start on oral contraception. Has discussed Micronor and Aygestin with Dr.Silva. Would like Dr.Silva's recommendation on which to take. Advised I will speak with Dr.Silva and return call. She is agreeable.

## 2015-05-26 NOTE — Telephone Encounter (Signed)
OK for Micronor x 4 months if needed.  OK for pelvic ultrasound with IUD insertion due to fibroid uterus. Will need to be precerted.

## 2015-05-27 NOTE — Telephone Encounter (Signed)
Left message to call Kaitlyn at [phone]. 

## 2015-05-27 NOTE — Telephone Encounter (Signed)
The Micronor is a progesterone based contraceptive. I think this should be fine for the patient until she receives the IUD, unless she is having really heavy bleeding or significant pelvic pain.  Please let me know.  Aygestin is a similar medication to Micronor and is given in a higher dose to control these symptoms. She used Megace in the past prior to going on the Depo Lupron.

## 2015-05-27 NOTE — Telephone Encounter (Signed)
Spoke with patient. Advised of message as seen below from [place]. Patient is agreeable. She would like to discuss benefits for IUD insertion with PUS prior to scheduling as she will need to schedule based on this information. Advised I will send a message to our insurance and billing department so benefits may be checked. Advised she will receive a phone call to discuss benefits and then be schedule. She is agreeable. Also asking if Micronor will be the same dose she was on previously. Advised this will be the same dosage. Patient states that when she was previously on Micronor 0.35 mg it did not control her bleeding or her discomfort. "Dr.Silva and I discussed a higher dose of medication or maybe it was another medication. I am not sure." Advised I will speak with Dr.Silva regarding medication and return call. She is agreeable.  Cc: Lerry Liner for precert of PUS with IUD insert

## 2015-05-28 ENCOUNTER — Telehealth: Payer: Self-pay | Admitting: Internal Medicine

## 2015-05-28 NOTE — Telephone Encounter (Signed)
Spoke with pt regarding benefit for ultrasound and IUD insertion. Patient understood and agreeable. Patient ready to schedule. Patient scheduled 06/05/15 with Dr Quincy Simmonds. Pt aware of arrival date and time. Pt aware of 72 hours cancellation policy with [id number] fee. No further questions. Routing to Provider for final review. Ok to close

## 2015-05-28 NOTE — Telephone Encounter (Signed)
Pt scheduled on 06/06/15 per pt availability

## 2015-05-28 NOTE — Telephone Encounter (Signed)
Dr.Silva, please review and advise regarding patients medication based on patients moderate pelvic pain (see note below from today).

## 2015-05-28 NOTE — Telephone Encounter (Signed)
[facility] - Client [facility] Patient Name: [name] DOB: 14-Oct-1970 Initial Comment caller states she fell on her knee Friday and now its numb Nurse Assessment Nurse: Markus Daft, RN, Sherre Poot Date/Time (Eastern Time): 05/28/2015 1:40:05 PM Confirm and document reason for call. If symptomatic, describe symptoms. You must click the next button to save text entered. ---Caller states that she fell when walking in the parking lot on her left knee Friday, and Saturday noticed it was starting to have numbness/tingling. A little swelling at the knee, no bruising. -- Migraine headache for a few days. No dizziness/passing with this. Friend witnessed it. She was able to stand back up and walk. No problems with her gait. Has the patient traveled out of the country within the last 30 days? ---Not Applicable Does the patient have any new or worsening symptoms? ---Yes Will a triage be completed? ---Yes Related visit to physician within the last 2 weeks? ---No Does the PT have any chronic conditions? (i.e. diabetes, asthma, etc.) ---Yes List chronic conditions. ---Migraines, endometriosis Is the patient pregnant or possibly pregnant? (Ask all females between the ages of 50-55) ---No Is this a behavioral health or substance abuse call? ---No Guidelines Guideline Title Affirmed Question Affirmed Notes Knee Injury Minor injury or pain from direct blow (all triage questions negative) Final Disposition User See PCP When Office is Open (within 3 days) Markus Daft, Therapist, sports, Sherre Poot Comments Caller needs to be seen in 2-3 days due to this numbness. She will call office to set this up. Referrals REFERRED TO PCP OFFICE Disagree/Comply: Comply

## 2015-05-28 NOTE — Telephone Encounter (Signed)
I would not start oral medical therapy as we are planning for the Mirena IUD insertion in a few days.  She may take her Tramadol for pain if needed.

## 2015-05-28 NOTE — Telephone Encounter (Signed)
Spoke with patient. Advised of message as seen below from [place]. Patient state that she is having pelvic pain that is intermittent. Pain is a 8/10 when it occurs. Denies any current pain. Reports pain kept her up last night, but subsided this morning. Denies any current bleeding. "It feels like I am going to start my period, but I have not." Advised I will speak with Dr.Silva and return call with further recommendations. She is agreeable.

## 2015-05-28 NOTE — Telephone Encounter (Signed)
Left a message for a return call.

## 2015-05-28 NOTE — Telephone Encounter (Signed)
Will need an appt to discuss.  Please schedule an appt.  Thanks!

## 2015-05-28 NOTE — Telephone Encounter (Signed)
Pt would like to see if she could be changed from Celexa to Effexor or Zoloft.  Dr. Tomi Likens state that Effexor will help with her migraines.

## 2015-05-28 NOTE — Telephone Encounter (Signed)
lmom for pt to call office to make an appt.

## 2015-05-28 NOTE — Telephone Encounter (Signed)
Pt scheduled  

## 2015-05-28 NOTE — Telephone Encounter (Signed)
I have reviewed the phone calls, and closed the encounter.

## 2015-05-29 NOTE — Telephone Encounter (Signed)
Spoke with patient. Advised of message as seen below from [place]. She is agreeable and verbalizes understanding.  Routing to provider for final review. Patient agreeable to disposition. Will close encounter.

## 2015-05-30 ENCOUNTER — Ambulatory Visit: Payer: BLUE CROSS/BLUE SHIELD

## 2015-06-03 ENCOUNTER — Telehealth: Payer: Self-pay | Admitting: Obstetrics and Gynecology

## 2015-06-03 NOTE — Telephone Encounter (Addendum)
Patient has a PUS and IUD insertion appointment 06/05/15 and will be moving Friday and Saturday. Patient is concerned about having this procedure done and moving the next day. I told patient a nurse would need to call her back. I informed patient of the 72 hour cancellation policy for procedures. Patient is worried about having this done before moving.

## 2015-06-03 NOTE — Telephone Encounter (Signed)
Left message to call Amee Boothe at [phone]. 

## 2015-06-04 NOTE — Telephone Encounter (Signed)
Patient returning your call can be reached after 12 since she will be in a meeting.

## 2015-06-04 NOTE — Telephone Encounter (Signed)
Spoke with patient. Patient states that she found out that she had to move suddenly and is worried about having to move after having an IUD placed. Patient is also having to care for a sick family member and is very worried about how she will feel after IUD insertion. Requesting to reschedule appointment at this time. Appointment rescheduled for 06/19/2015 at 11 am for PUS guided IUD insertion. She is agreeable to date and time.  Routing to provider for final review. Patient agreeable to disposition. Will close encounter.

## 2015-06-05 ENCOUNTER — Other Ambulatory Visit: Payer: BLUE CROSS/BLUE SHIELD | Admitting: Obstetrics and Gynecology

## 2015-06-05 ENCOUNTER — Other Ambulatory Visit: Payer: BLUE CROSS/BLUE SHIELD

## 2015-06-05 NOTE — Progress Notes (Signed)
Document opened and reviewed for OV but appt  canceled same day .  

## 2015-06-06 ENCOUNTER — Encounter: Payer: BLUE CROSS/BLUE SHIELD | Admitting: Internal Medicine

## 2015-06-18 ENCOUNTER — Telehealth: Payer: Self-pay | Admitting: Obstetrics and Gynecology

## 2015-06-18 NOTE — Telephone Encounter (Signed)
Patient is scheduled for an ultrasound guided IUD placement appointment tomorrow. She called and said, "I started the equivalent of my cycle today. Should I keep my appointment?"

## 2015-06-18 NOTE — Telephone Encounter (Signed)
Patient advised, per Tracy's direction to me, it is okay to keep the appointment.

## 2015-06-19 ENCOUNTER — Ambulatory Visit (INDEPENDENT_AMBULATORY_CARE_PROVIDER_SITE_OTHER): Payer: BLUE CROSS/BLUE SHIELD | Admitting: Obstetrics and Gynecology

## 2015-06-19 ENCOUNTER — Ambulatory Visit (INDEPENDENT_AMBULATORY_CARE_PROVIDER_SITE_OTHER): Payer: BLUE CROSS/BLUE SHIELD

## 2015-06-19 DIAGNOSIS — Z30430 Encounter for insertion of intrauterine contraceptive device: Secondary | ICD-10-CM

## 2015-06-19 DIAGNOSIS — D259 Leiomyoma of uterus, unspecified: Secondary | ICD-10-CM | POA: Diagnosis not present

## 2015-06-19 DIAGNOSIS — M25559 Pain in unspecified hip: Secondary | ICD-10-CM

## 2015-06-19 NOTE — Progress Notes (Signed)
GYNECOLOGY  VISIT   HPI: 45 y.o.   Single  African American  female   [phone] with  LMP 06/18/15. here for   Ultrasound guided IUD insertion.   Has pelvic pain, fibroids, and hx menorrhagia.  Completed 6 months of Depo Lupron.  Ready to have a Mirena IUD again.   Wants to have her headaches under control also and is working with Dr. Tomi Likens to do this.   GYNECOLOGIC HISTORY: No LMP recorded. Patient has had an injection.          OB History    Gravida Para Term Preterm AB TAB SAB Ectopic Multiple Living   7 2   5 1 1 3  2       Obstetric Comments   3 ectopic         Patient Active Problem List   Diagnosis Date Noted  . Morbid obesity ([place]) 01/30/2015  . 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 RUQ pain 02/25/2014  . Diarrhea 02/25/2014  . Calcium oxalate crystals in urine 02/22/2014  . Intramural leiomyoma of uterus 02/22/2014  . Hx of migraine headaches 02/04/2014  . Medication management 02/04/2014  . Hx of endometriosis 02/04/2014  . Right lower quadrant abdominal pain mid also 02/04/2014  . Adjustment disorder with anxious mood 01/17/2014  . Thyroid mass 02/16/2013  . Fever, unspecified 02/15/2013  . Sinusitis 02/15/2013  . Anxiety 12/11/2011  . Stress 12/11/2011  . Irregular periods 08/17/2011  . Endometriosis 08/17/2011  . Edema 05/10/2011  . Elevated blood pressure reading 05/10/2011  . Fatigue 04/08/2011  . Protracted URI 04/08/2011  . SINUSITIS, RECURRENT 11/06/2009  . FATIGUE 02/25/2009  . PALPITATIONS 02/25/2009  . DYSPNEA 02/25/2009  . KNEE INJURY, RIGHT 12/05/2007  . HEADACHE 05/03/2007  . ADULT SITUATIONAL REACTION 11/21/2006  . SLEEPLESSNESS 11/21/2006  . CHEST PAIN 11/21/2006  . TINEA PEDIS 08/31/2006  . Other and unspecified hyperlipidemia 08/31/2006  . DISORDER, ADJUSTMENT W/ANXIETY 08/31/2006  . PLANTAR FASCIITIS 08/31/2006  . ANEMIA-NOS 06/23/2006  . MIGRAINE, COMMON 06/23/2006  . ALLERGIC RHINITIS  06/23/2006  . GERD 06/23/2006  . IRRITABLE BOWEL SYNDROME 06/23/2006    Past Medical History  Diagnosis Date  . Anxiety   . Chest pain     neg stress test summer 2010  . Migraines   . Endometriosis   . Sinusitis   . Ovarian torsion   . Allergic rhinitis     hx of testing and allergic to spring and fall f= pollens  . Anemia     nos  . GERD (gastroesophageal reflux disease)   . Endometriosis   . Hyperlipidemia   . Headache(784.0)   . Labial cyst 04/08/2011    early infection use hotpcompresses    . Thyroid disease     goiter  . STD (sexually transmitted disease)     Pos. Chlamydia--in her 2's  . Goiter   . Irritable bowel syndrome   . Fibroids   . Hypertension     Past Surgical History  Procedure Laterality Date  . Tubal ligation    . Unilateral salpingectomy      right  age 44 ectopics.  . Cesarean section  2002, 2004    x2  . Pelvic laparoscopy      x9  . Radiology with anesthesia N/A 09/05/2014    Procedure: MRI OF BRAIN WITHOUT CONTRAST;  Surgeon: Medication Radiologist, MD;  Location: [facility];  Service: Radiology;  Laterality: N/A;  MRI/DR. JAFFE    Current Outpatient  Prescriptions  Medication Sig Dispense Refill  . atenolol (TENORMIN) 25 MG tablet Take 3 tablets (75 mg total) by mouth daily. 90 tablet 3  . citalopram (CELEXA) 20 MG tablet TAKE 1 TABLET BY MOUTH ONCE DAILY 120 tablet 1  . Fish Oil OIL by Does not apply route.    . fluticasone (FLONASE) 50 MCG/ACT nasal spray Place 2 sprays into both nostrils daily. (Patient taking differently: Place 2 sprays into both nostrils daily as needed for allergies. ) 1 g prn  . ibuprofen (ADVIL,MOTRIN) 800 MG tablet TAKE 1 TABLET(800 MG) BY MOUTH EVERY 8 HOURS AS NEEDED 90 tablet 0  . loratadine (CLARITIN) 10 MG tablet Take 10 mg by mouth daily as needed for allergies.     Marland Kitchen LUPRON DEPOT 3.75 MG injection Inject 3.75 mg into the muscle every 30 (thirty) days.  5  . Multiple Vitamin (MULTIVITAMIN WITH MINERALS) TABS  tablet Take 1 tablet by mouth daily.    . pantoprazole (PROTONIX) 40 MG tablet TAKE 1 TABLET BY MOUTH DAILY 30 tablet 2  . promethazine (PHENERGAN) 12.5 MG tablet Take 1 tablet (12.5 mg total) by mouth every 6 (six) hours as needed for nausea or vomiting. 30 tablet 1  . traMADol (ULTRAM) 50 MG tablet Take 1 tablet (50 mg total) by mouth every 6 (six) hours as needed. She may take every 6 hours but limited to no more than 2 days out of the week 32 tablet 2  . traMADol-acetaminophen (ULTRACET) 37.5-325 MG tablet Take 2 tablets by mouth every 6 (six) hours as needed. 30 tablet 0  . triamterene-hydrochlorothiazide (MAXZIDE-25) 37.5-25 MG per tablet Take 1 tablet by mouth daily. 90 tablet 3  . TURMERIC PO Take by mouth.     No current facility-administered medications for this visit.     ALLERGIES: Codeine phosphate; Moxifloxacin; and Sulfamethoxazole  Family History  Problem Relation Age of Onset  . Anxiety disorder Mother     mother is on xanax and apparently applying for  social security diabilty for her anxiety. t  . Depression Mother   . Diabetes Mother   . Hypertension Mother   . Coronary artery disease    . Diabetes    . Hyperlipidemia    . Other      history of child death in family   . Heart attack Father   . Seizures Father   . Stroke Maternal Grandmother   . Diabetes Paternal Grandmother   . Hypertension Paternal Grandmother   . Colon cancer Neg Hx   . Stomach cancer Neg Hx   . Esophageal cancer Neg Hx     Social History   Social History  . Marital Status: Single    Spouse Name: N/A  . Number of Children: N/A  . Years of Education: N/A   Occupational History  . Not on file.   Social History Main Topics  . Smoking status: Never Smoker   . Smokeless tobacco: Never Used  . Alcohol Use: No  . Drug Use: No  . Sexual Activity:    Partners: Male    Birth Control/ Protection: Surgical     Comment: Tubal   Other Topics Concern  . 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   And graduated as SW.      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  PHYSICAL EXAMINATION:    There were no vitals taken for this visit.    General appearance: alert, cooperative and appears stated age    Pelvic: External genitalia:  no lesions              Urethra:  normal appearing urethra with no masses, tenderness or lesions              Bartholins and Skenes: normal                 Vagina: normal appearing vagina with normal color and discharge, no lesions              Cervix: no lesions.  Menstrual flow noted.         Bimanual Exam:  Uterus:  normal size, contour, position, consistency, mobility, non-tender              Adnexa: normal adnexa and no mass, fullness, tenderness                Procedure - Mirena IUD insertion - lot number [id number], expiration 01/20.  Consent for procedure.  Speculum placed in vagina.  Sterile prep with Hibiclens.  Tenaculum to anterior cervical lip.  Paracervical block with 10 cc 1% lidocaine.  Lot number [id number], expiration 03/25/16.  Os finder used.  Uterus sounded to 8.5 cm.  Mirena IUD placed without difficulty.  Strings trimmed.  TV ultrasound confirms the IUD in the proper position of the endometrial canal after procedure is completed. No complications.  Minimal EBL.   Chaperone was present for exam.  Pelvic ultrasound -  Uterus with 2 fibroids noted - largest 42 x 36 mm.  EMS 6.6 mm.  Normal ovaries.    ASSESSMENT  Uterine fibroids.  Mirena IUD placement with Ultrasound guidance.   Successful.   PLAN  Fibroids discussed with patient.  Mirena IUD instructions and precautions given.  NSAIDs for pain control prn.  Follow up in 5 weeks for a recheck appointment.    An After Visit Summary was printed and given to the patient.  ___10____ minutes face to face time of which over 50% was spent in counseling.

## 2015-06-20 ENCOUNTER — Encounter: Payer: Self-pay | Admitting: Obstetrics and Gynecology

## 2015-07-02 ENCOUNTER — Ambulatory Visit (INDEPENDENT_AMBULATORY_CARE_PROVIDER_SITE_OTHER): Payer: BLUE CROSS/BLUE SHIELD | Admitting: Neurology

## 2015-07-02 DIAGNOSIS — G43419 Hemiplegic migraine, intractable, without status migrainosus: Secondary | ICD-10-CM

## 2015-07-02 MED ORDER — ONABOTULINUMTOXINA 100 UNITS IJ SOLR
155.0000 [IU] | Freq: Once | INTRAMUSCULAR | Status: AC
  Administered 2015-07-02: 155 [IU] via INTRAMUSCULAR

## 2015-07-02 NOTE — Procedures (Signed)
[facility]   Procedure Note Botox  Attending: Dr. Tomi Bishop  Preoperative Diagnosis(es): Chronic migraine  Consent obtained from: The patient Benefits discussed included, but were not limited to decreased muscle tightness, increased joint range of motion, and decreased pain.  Risk discussed included, but were not limited pain and discomfort, bleeding, bruising, excessive weakness, venous thrombosis, muscle atrophy and dysphagia.  Anticipated outcomes of the procedure as well as he risks and benefits of the alternatives to the procedure, and the roles and tasks of the personnel to be involved, were discussed with the patient, and the patient consents to the procedure and agrees to proceed. A copy of the patient medication guide was given to the patient which explains the blackbox warning.  Patients identity and treatment sites confirmed Yes.  .  Details of Procedure: Skin was cleaned with alcohol. Prior to injection, the needle plunger was aspirated to make sure the needle was not within a blood vessel.  There was no blood retrieved on aspiration.    Following is a summary of the muscles injected  And the amount of Botulinum toxin used:  Dilution 200 units of Botox was reconstituted with 4 ml of preservative free normal saline. Time of reconstitution: At the time of the office visit (<30 minutes prior to injection)   Injections  155 total units of Botox was injected with a 30 gauge needle.  Injection Sites: L occipitalis: 15 units- 3 sites  R occiptalis: 15 units- 3 sites  L upper trapezius: 15 units- 3 sites R upper trapezius: 15 units- 3 sits          L paraspinal (___mid__level): 10 units- 2 sites R paraspinal (__mid___level): 10 units- 2 sites  Face L frontalis(2 injection sites):10 units   R frontalis(2 injection sites):10 units         L corrugator: 5 units   R corrugator: 5 units           Procerus: 5 units   L temporalis: 20 units R temporalis: 20 units   Agent:   200 units of botulinum Type A (Onobotulinum Toxin type A) was reconstituted with 4 ml of preservative free normal saline.  Time of reconstitution: At the time of the office visit (<30 minutes prior to injection)     Total injected (Units): 155  Total wasted (Units): 45  Patient tolerated procedure well without complications.   Reinjection is anticipated in 3 months. Return to clinic in 6 weeks  [name] R. Renee Likens, DO

## 2015-07-02 NOTE — Progress Notes (Signed)
See Botox procedure note

## 2015-07-02 NOTE — Patient Instructions (Signed)
See Botox procedure note

## 2015-07-03 ENCOUNTER — Other Ambulatory Visit: Payer: Self-pay | Admitting: Neurology

## 2015-07-03 ENCOUNTER — Telehealth: Payer: Self-pay | Admitting: Family Medicine

## 2015-07-03 NOTE — Telephone Encounter (Signed)
This is a message that did not make it into Epic.  Pt has been seen and treated for this problem.  [facility] - Client [place] Patient Name: [name] Gender: Female DOB: 30-Mar-1970 Age: 45 Y 3 M 10 D Return Phone Number: [phone] (Primary) Address: City/State/Zip: [place] Client [facility] - Client Client Site [facility] - Night Physician [name] - MD Contact Type Call Who Is Calling Patient / Member / Family / Caregiver Call Type Triage / Clinical Relationship To Patient Self Return Phone Number [phone] (Primary) Chief Complaint Facial Swelling Reason for Call Symptomatic / Request for [place] states she woke up w/ face swollen. PreDisposition Did not know what to do Translation No Nurse Assessment Nurse: Renee Guise, RN, Vaughan Basta Date/Time (Eastern Time): 06/28/2015 10:55:32 AM Confirm and document reason for call. If symptomatic, describe symptoms. You must click the next button to save text entered. ---Caller states her whole face is swollen, left side is worse. No tooth pain. Has had a migraine for several days x 7/10 pain rating. Day 28 of having migraine. Also has nasal allergies and ears have been itchy and some pain off and on. Face feels heavy and like someone hit her with a brick. Was not there last night and saw for first time this morning. Not painful to touch and no fever. Has the patient traveled out of the country within the last 30 days? ---No Does the patient have any new or worsening symptoms? ---Yes Will a triage be completed? ---Yes Related visit to physician within the last 2 weeks? ---Yes Does the PT have any chronic conditions? (i.e. diabetes, asthma, etc.) ---Yes List chronic conditions. ---migraines fibroids endometriosis Is the patient pregnant or possibly pregnant? (Ask all  females between the ages of 21-55) ---No Is this a behavioral health or substance abuse call? ---No Guidelines Guideline Title Affirmed Question Affirmed Notes Nurse Date/Time [name] Time) Face Swelling SEVERE swelling of the entire face Renee Guise, RN, Vaughan Basta 06/28/2015 11:00:09 AM Disp. Time [name] Time) Disposition Final User PLEASE NOTE: All timestamps contained within this report are represented as [place] Standard Time. CONFIDENTIALTY NOTICE: This fax transmission is intended only for the addressee. It contains information that is legally privileged, confidential or otherwise protected from use or disclosure. If you are not the intended recipient, you are strictly prohibited from reviewing, disclosing, copying using or disseminating any of this information or taking any action in reliance on or regarding this information. If you have received this fax in error, please notify us immediately by telephone so that we can arrange for its return to [place]. Phone: [phone], Toll-Free: [phone], Fax: [phone] Page: 2 of 2 Call Id: [id number] 06/28/2015 11:03:34 AM See Physician within 4 Hours (or PCP triage) Yes Renee Guise, RN, Phineas Semen Understands: Yes Disagree/Comply: Comply Care Advice Given Per Guideline SEE PHYSICIAN WITHIN 4 HOURS (or PCP triage): ANTIHISTAMINE (E.G., BENADRYL): * Benadryl (diphenhydramine) every 6 hours is best. Adult dose is 25-50 mg. Take 2 or 3 times. * Take an antihistamine by mouth to reduce the swelling and to help with any itching. CALL BACK IF: * You become worse. CARE ADVICE given per Face Swelling (Adult) guideline. Referrals [facility]

## 2015-07-03 NOTE — Telephone Encounter (Signed)
This is a message that did not make it into Epic.  Pt has been treated for this problem.  [facility] - Client [place] Patient Name: [name] Gender: Female DOB: 26-Nov-1970 Age: 45 Y 3 M 10 D Return Phone Number: [phone] (Primary) Address: City/State/Zip: [place] Client [facility] - Client Client Site [place] [facility] - Night Physician [name] - MD Contact Type Call Who Is Calling Patient / Member / Family / Caregiver Call Type Triage / Clinical Relationship To Patient Self Return Phone Number [phone] (Primary) Chief Complaint Facial Swelling Reason for Call Symptomatic / Request for [place] states woke up with face swollen and would like to be seen. Translation No Nurse Assessment Nurse: Leilani Merl, RN, Heather Date/Time (Eastern Time): 06/28/2015 11:15:00 AM Confirm and document reason for call. If symptomatic, describe symptoms. You must click the next button to save text entered. ---Caller was trying to get an appt at the [place] office or see if she could wait and be seen on Monday. Caller informed that the [place] office is booked for today and she does not need to wait until Monday since the previous triage nurse told her to be seen within 4 hours. Caller verbalizes understanding. Has the patient traveled out of the country within the last 30 days? ---Not Applicable Does the patient have any new or worsening symptoms? ---No Guidelines Guideline Title Affirmed Question Affirmed Notes Nurse Date/Time (Eastern Time) Disp. Time [name] Time) Disposition Final User 06/28/2015 11:16:17 AM Clinical Call Yes [place], RN, Nira Conn

## 2015-07-08 ENCOUNTER — Telehealth: Payer: Self-pay

## 2015-07-08 MED ORDER — PROMETHAZINE HCL 25 MG PO TABS: 25.0000 mg | ORAL_TABLET | Freq: Four times a day (QID) | ORAL | Status: AC | PRN

## 2015-07-08 MED ORDER — ATENOLOL 100 MG PO TABS: 100.0000 mg | ORAL_TABLET | Freq: Every day | ORAL | Status: DC

## 2015-07-08 NOTE — Telephone Encounter (Signed)
Pt aware Rx sent in  

## 2015-07-08 NOTE — Telephone Encounter (Signed)
Pt called about Migraines. Pt states she has had a migraine for the last 30-35 days straight, but they worsened on Sunday. Pt is taking Atenolol 75 mg daily still. Pt states she does not have any Ultracet 37.5-325 mg because she moved. (Will submit refill new to new location). Pt is also very nauseated. Pt states she is taking the 12.5 mg of phenagren with out relief. Would like to know if there is something else she can take. Please advise.

## 2015-07-08 NOTE — Telephone Encounter (Signed)
We can increase promethazine to 25mg  every 6 hours as needed.  We can increase atenolol to 100mg  daily.  She just had her first round of Botox last week, so it may take time to see results.

## 2015-07-09 ENCOUNTER — Ambulatory Visit: Payer: BLUE CROSS/BLUE SHIELD

## 2015-07-24 ENCOUNTER — Telehealth: Payer: Self-pay | Admitting: *Deleted

## 2015-07-24 ENCOUNTER — Ambulatory Visit: Payer: BLUE CROSS/BLUE SHIELD | Admitting: Obstetrics and Gynecology

## 2015-07-24 NOTE — Telephone Encounter (Signed)
Thank you for the update!

## 2015-07-24 NOTE — Telephone Encounter (Signed)
Patient called and rescheduled todays appointment till next week. She is in bed sick with a Migraine.

## 2015-07-30 ENCOUNTER — Ambulatory Visit (INDEPENDENT_AMBULATORY_CARE_PROVIDER_SITE_OTHER): Payer: BLUE CROSS/BLUE SHIELD | Admitting: Obstetrics and Gynecology

## 2015-07-30 ENCOUNTER — Encounter: Payer: Self-pay | Admitting: Obstetrics and Gynecology

## 2015-07-30 VITALS — BP 112/70 | HR 74 | Resp 14 | Ht 66.0 in | Wt 247.6 lb

## 2015-07-30 DIAGNOSIS — Z30431 Encounter for routine checking of intrauterine contraceptive device: Secondary | ICD-10-CM

## 2015-07-30 NOTE — Progress Notes (Signed)
GYNECOLOGY  VISIT   HPI: 45 y.o.   Single  Caucasian  female   ZC:[id number] with No LMP recorded. Patient is not currently having periods (Reason: IUD).   here for follow-up IUD check.   Had IUD placement with ultrasound guidance.  Menses since IUD placed - bleeding for 10 days.  Cramping for the first few days.  Had a big migraine with the menses. Had hemiplegic migraines and aura.  Last Depo Lupron injection the beginning of March 2017.  Really liked not having bleeding and pain.   Not sexually active.   Did Botox injections just prior to her IUD insertion.  Has hemiplegic migraines.   GYNECOLOGIC HISTORY: No LMP recorded. Patient is not currently having periods (Reason: IUD). Contraception:  IUD Menopausal hormone therapy:  none Last mammogram:  12/03/13 BIRADS1 Last pap smear:   09/10/13 WNL neg HR HPV        OB History    Gravida Para Term Preterm AB TAB SAB Ectopic Multiple Living   7 2   5 1 1 3  2       Obstetric Comments   3 ectopic         Patient Active Problem List   Diagnosis Date Noted  . Morbid obesity ([place]) 01/30/2015  . 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 RUQ pain 02/25/2014  . Diarrhea 02/25/2014  . Calcium oxalate crystals in urine 02/22/2014  . Intramural leiomyoma of uterus 02/22/2014  . Hx of migraine headaches 02/04/2014  . Medication management 02/04/2014  . Hx of endometriosis 02/04/2014  . Right lower quadrant abdominal pain mid also 02/04/2014  . Adjustment disorder with anxious mood 01/17/2014  . Thyroid mass 02/16/2013  . Fever, unspecified 02/15/2013  . Sinusitis 02/15/2013  . Anxiety 12/11/2011  . Stress 12/11/2011  . Irregular periods 08/17/2011  . Endometriosis 08/17/2011  . Edema 05/10/2011  . Elevated blood pressure reading 05/10/2011  . Fatigue 04/08/2011  . Protracted URI 04/08/2011  . SINUSITIS, RECURRENT 11/06/2009  . FATIGUE 02/25/2009  . PALPITATIONS 02/25/2009  . DYSPNEA 02/25/2009   . KNEE INJURY, RIGHT 12/05/2007  . HEADACHE 05/03/2007  . ADULT SITUATIONAL REACTION 11/21/2006  . SLEEPLESSNESS 11/21/2006  . CHEST PAIN 11/21/2006  . TINEA PEDIS 08/31/2006  . Other and unspecified hyperlipidemia 08/31/2006  . DISORDER, ADJUSTMENT W/ANXIETY 08/31/2006  . PLANTAR FASCIITIS 08/31/2006  . ANEMIA-NOS 06/23/2006  . MIGRAINE, COMMON 06/23/2006  . ALLERGIC RHINITIS 06/23/2006  . GERD 06/23/2006  . IRRITABLE BOWEL SYNDROME 06/23/2006    Past Medical History  Diagnosis Date  . Anxiety   . Chest pain     neg stress test summer 2010  . Migraines   . Endometriosis   . Sinusitis   . Ovarian torsion   . Allergic rhinitis     hx of testing and allergic to spring and fall f= pollens  . Anemia     nos  . GERD (gastroesophageal reflux disease)   . Endometriosis   . Hyperlipidemia   . Headache(784.0)   . Labial cyst 04/08/2011    early infection use hotpcompresses    . Thyroid disease     goiter  . STD (sexually transmitted disease)     Pos. Chlamydia--in her 59's  . Goiter   . Irritable bowel syndrome   . Fibroids   . Hypertension     Past Surgical History  Procedure Laterality Date  . Tubal ligation    . Unilateral salpingectomy      right  age 63 ectopics.  . Cesarean section  2002, 2004    x2  . Pelvic laparoscopy      x9  . Radiology with anesthesia N/A 09/05/2014    Procedure: MRI OF BRAIN WITHOUT CONTRAST;  Surgeon: Medication Radiologist, MD;  Location: [facility];  Service: Radiology;  Laterality: N/A;  MRI/DR. JAFFE    Current Outpatient Prescriptions  Medication Sig Dispense Refill  . atenolol (TENORMIN) 100 MG tablet Take 1 tablet (100 mg total) by mouth daily. 30 tablet 1  . citalopram (CELEXA) 20 MG tablet TAKE 1 TABLET BY MOUTH ONCE DAILY 120 tablet 1  . Fish Oil OIL by Does not apply route.    . fluticasone (FLONASE) 50 MCG/ACT nasal spray Place 2 sprays into both nostrils daily. (Patient taking differently: Place 2 sprays into both nostrils  daily as needed for allergies. ) 1 g prn  . Multiple Vitamin (MULTIVITAMIN WITH MINERALS) TABS tablet Take 1 tablet by mouth daily.    . pantoprazole (PROTONIX) 40 MG tablet TAKE 1 TABLET BY MOUTH DAILY 30 tablet 2  . promethazine (PHENERGAN) 25 MG tablet Take 1 tablet (25 mg total) by mouth every 6 (six) hours as needed for nausea or vomiting. 60 tablet 1  . traMADol-acetaminophen (ULTRACET) 37.5-325 MG tablet TAKE 2 TABLETS BY MOUTH EVERY 6 HOURS AS NEEDED. 30 tablet 0  . triamterene-hydrochlorothiazide (MAXZIDE-25) 37.5-25 MG per tablet Take 1 tablet by mouth daily. 90 tablet 3  . TURMERIC PO Take by mouth.     No current facility-administered medications for this visit.     ALLERGIES: Codeine phosphate; Moxifloxacin; and Sulfamethoxazole  Family History  Problem Relation Age of Onset  . Anxiety disorder Mother     mother is on xanax and apparently applying for  social security diabilty for her anxiety. t  . Depression Mother   . Diabetes Mother   . Hypertension Mother   . Coronary artery disease    . Diabetes    . Hyperlipidemia    . Other      history of child death in family   . Heart attack Father   . Seizures Father   . Stroke Maternal Grandmother   . Diabetes Paternal Grandmother   . Hypertension Paternal Grandmother   . Colon cancer Neg Hx   . Stomach cancer Neg Hx   . Esophageal cancer Neg Hx     Social History   Social History  . Marital Status: Single    Spouse Name: N/A  . Number of Children: N/A  . Years of Education: N/A   Occupational History  . Not on file.   Social History Main Topics  . Smoking status: Never Smoker   . Smokeless tobacco: Never Used  . Alcohol Use: No  . Drug Use: No  . Sexual Activity:    Partners: Male    Birth Control/ Protection: Surgical     Comment: Tubal   Other Topics Concern  . 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   And graduated as SW.      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  PHYSICAL EXAMINATION:    BP 112/70 mmHg  Pulse 74  Resp 14  Ht 5\' 6"  (1.676 m)  Wt 247 lb 9.6 oz (112.311 kg)  BMI 39.98 kg/m2    General appearance: alert, cooperative and appears stated age   Pelvic: External genitalia:  no lesions              Urethra:  normal appearing urethra with no masses, tenderness or lesions              Bartholins and Skenes: normal                 Vagina: normal appearing vagina with normal color and discharge, no lesions              Cervix: no lesions and IUD strings seen.  Menstrual spotting noted.                Bimanual Exam:  Uterus:  normal size, contour, position, consistency, mobility, non-tender              Adnexa: normal adnexa and no mass, fullness, tenderness              Chaperone was present for exam.  ASSESSMENT  Mirena IUD check up.  Uterine fibroids.  Migraines - hemiplegic.  PLAN  Menstrual calendar.  Discussed the improved bleeding profile many women have with the IUD.  Not a good candidate for estrogen supplementation for menstrual migraines.  Discussed risk of stroke with estrogen use.  She will get her mammogram done.  Follow up for annual exam and prn.    An After Visit Summary was printed and given to the patient.  ___15___ minutes face to face time of which over 50% was spent in counseling.

## 2015-08-04 ENCOUNTER — Ambulatory Visit: Payer: BLUE CROSS/BLUE SHIELD | Admitting: Neurology

## 2015-08-04 DIAGNOSIS — Z029 Encounter for administrative examinations, unspecified: Secondary | ICD-10-CM

## 2015-08-04 IMAGING — MG MM DIGITAL SCREENING BILAT W/ CAD
4 series · 4 of 4 positions shown · non-contrast
Comparison: Previous exam(s).

CLINICAL DATA: Screening.

EXAM:
DIGITAL SCREENING BILATERAL MAMMOGRAM WITH CAD

[R CC]
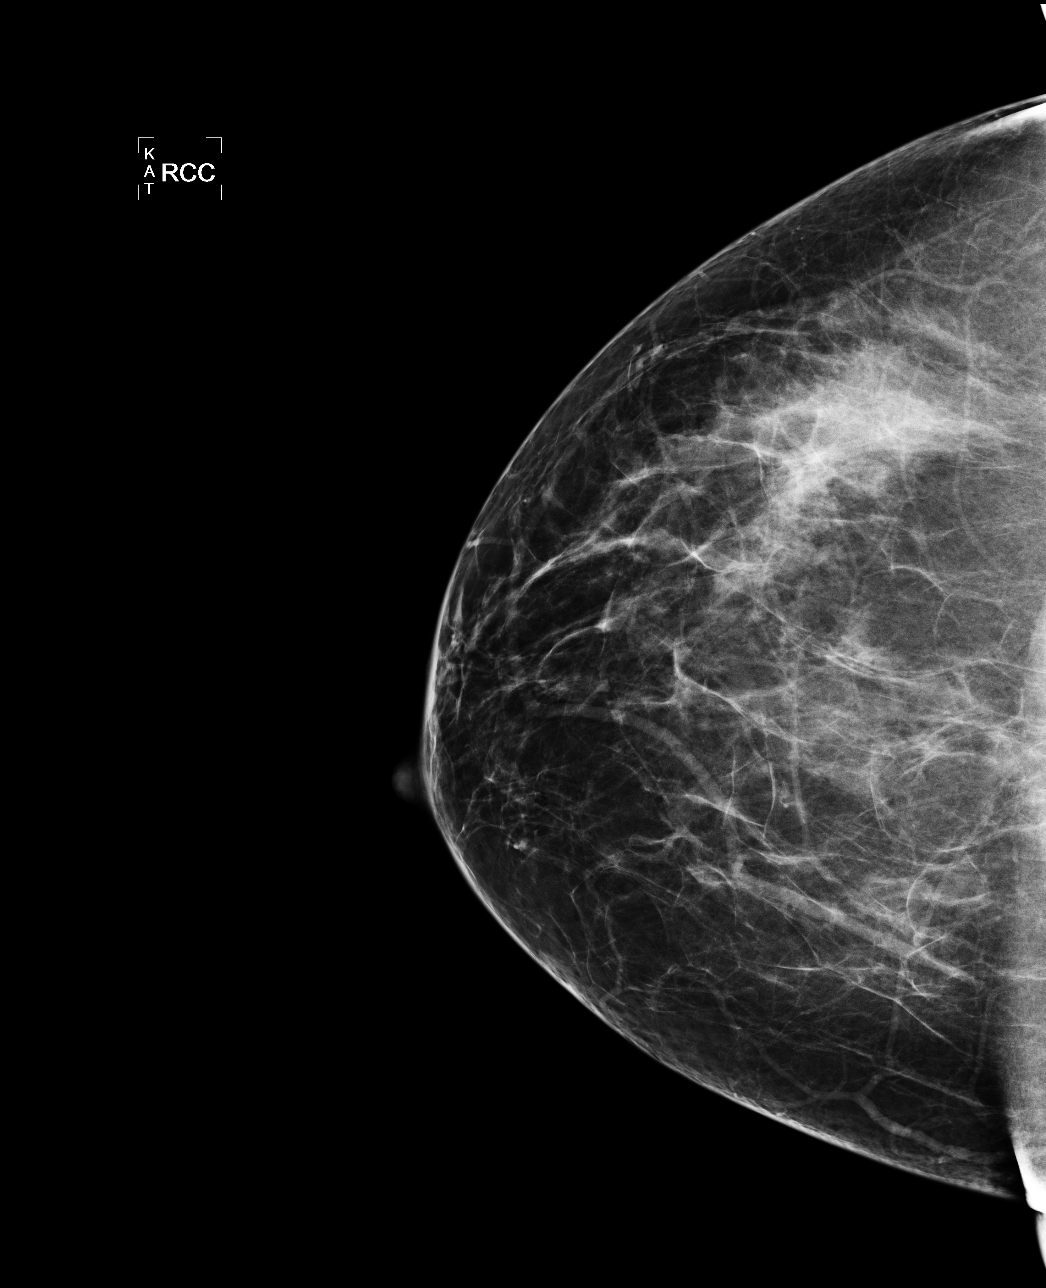

[L CC]
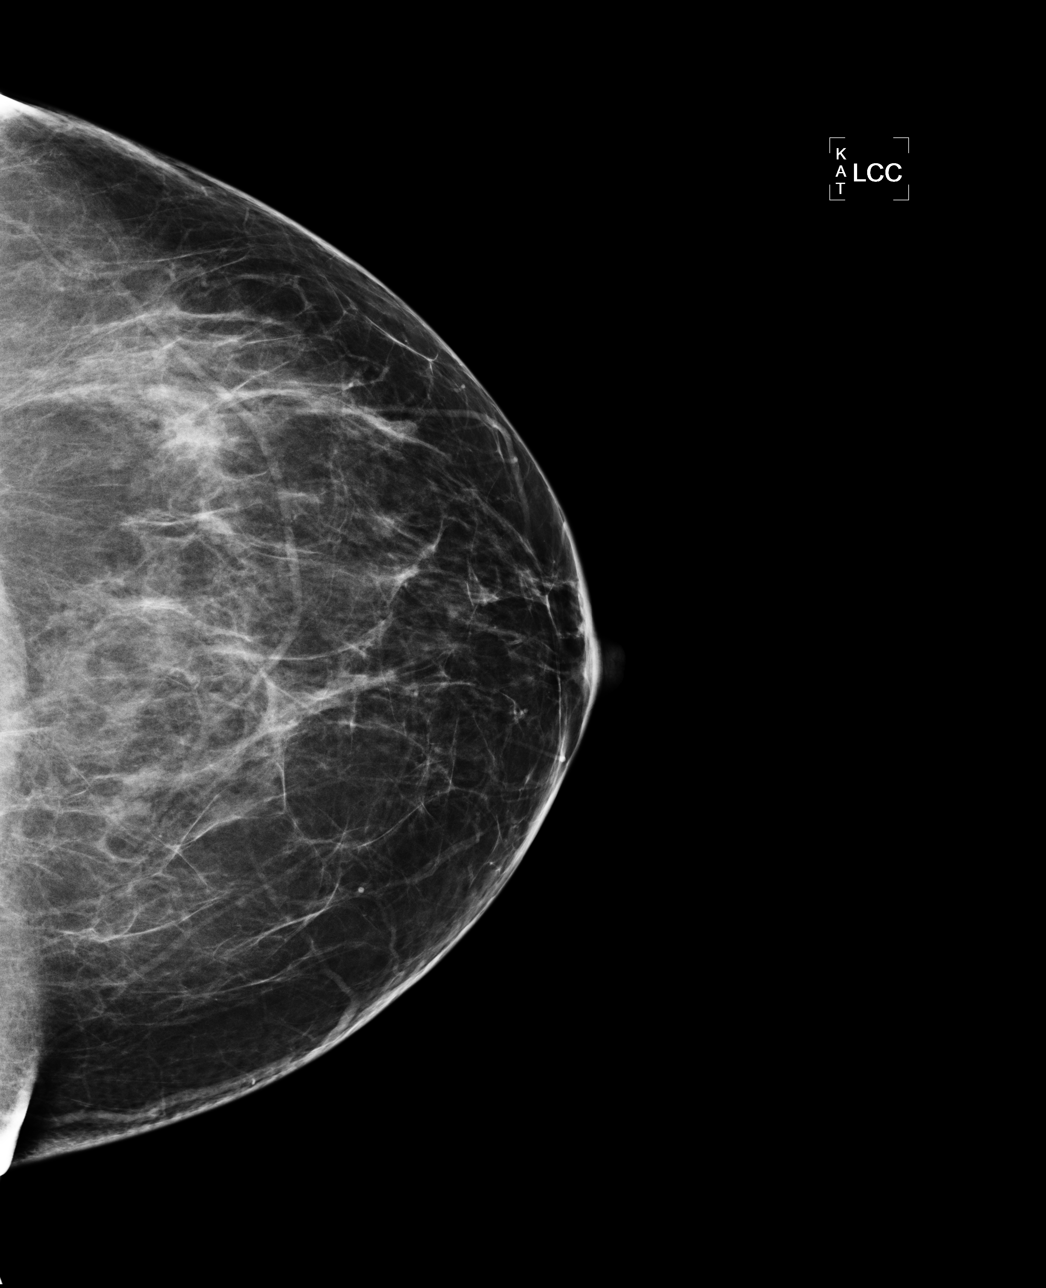

[L MLO]
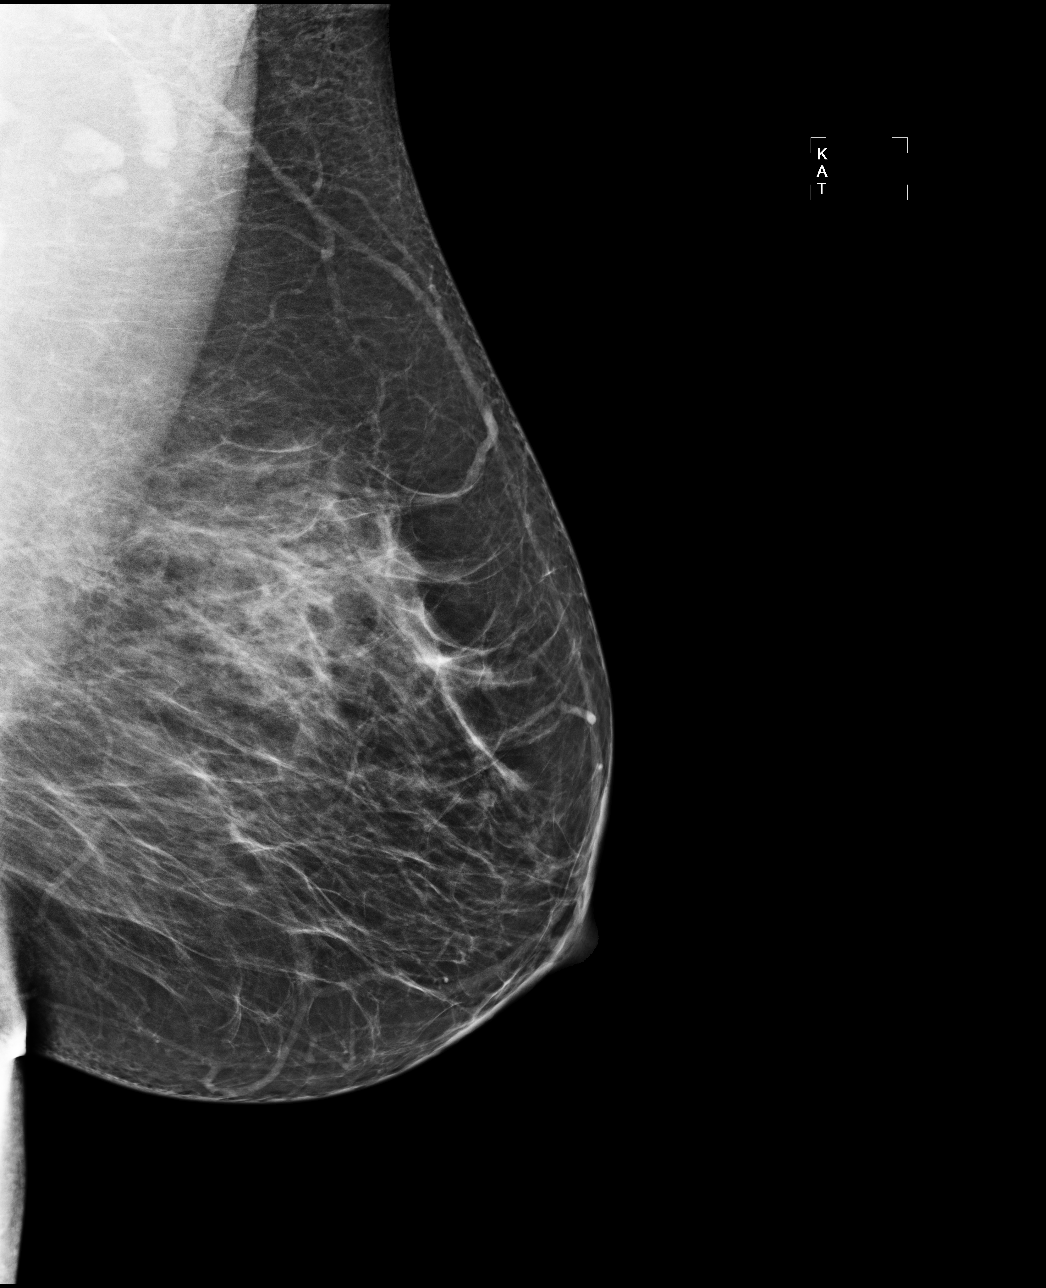

[R MLO]
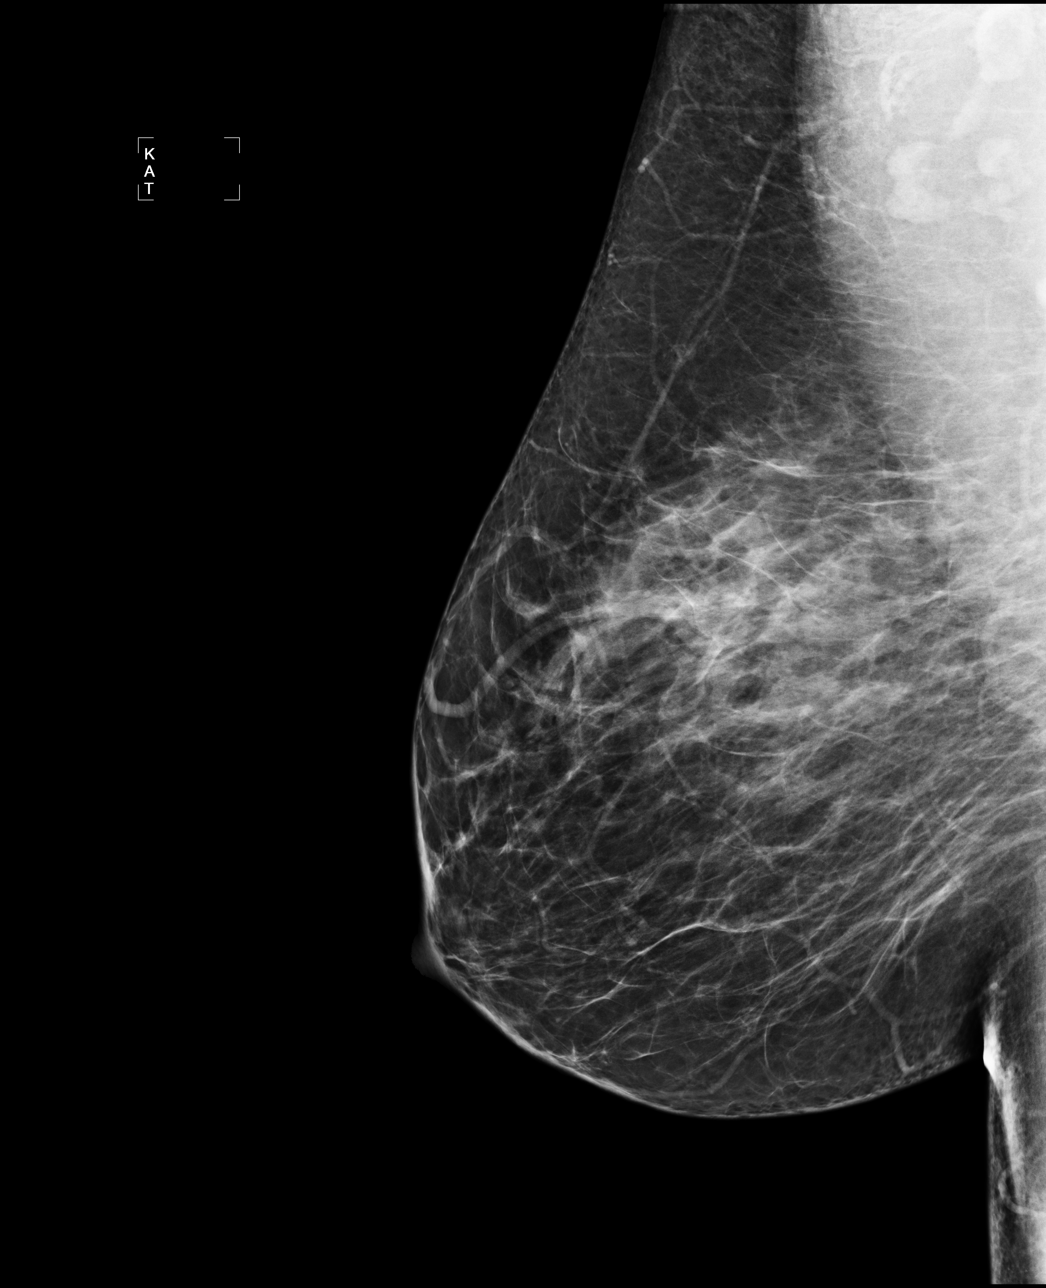

[4 of 4 positions shown; findings below may reference images not displayed]

ACR Breast Density Category b: There are scattered areas of
fibroglandular density.
FINDINGS: There are no findings suspicious for malignancy. Images were
processed with CAD.
IMPRESSION: No mammographic evidence of malignancy. A result letter of this
screening mammogram will be mailed directly to the patient.

RECOMMENDATION:
Screening mammogram in one year. (Code:AS-G-LCT)

BI-RADS CATEGORY  1: Negative.

## 2015-08-11 ENCOUNTER — Telehealth: Payer: Self-pay | Admitting: Neurology

## 2015-08-11 NOTE — Telephone Encounter (Deleted)
[name] 02/08/2070. She called with a Migraine

## 2015-08-11 NOTE — Telephone Encounter (Signed)
Attempted to reach pt at number listed. Line disconnected before vm picked up.

## 2015-08-11 NOTE — Telephone Encounter (Signed)
[name] 07/23/2070. She would like to speak with you about a migraine that she has. Her # is [phone]. Thank you

## 2015-08-13 MED ORDER — VENLAFAXINE HCL 37.5 MG PO TABS
ORAL_TABLET | ORAL | Status: DC
Start: 2015-08-13 — End: 2015-09-08

## 2015-08-13 MED ORDER — CITALOPRAM HYDROBROMIDE 20 MG PO TABS: ORAL_TABLET | ORAL | Status: DC

## 2015-08-13 NOTE — Telephone Encounter (Signed)
If she is willing to get discontinue Cymbalta and/or Celexa and switch to another antidepressant that may be more effective in treating headache, we can do that.  The other options are really limited to antidepressants.  Did she try Cambia powder to treat the headache?  She should take once for acute headache.  Should try to take on an empty stomach for better absorption.    I would consider getting the IUD taken out, as this may contribute to headaches.  Otherwise, I really have no other options.  We just started the Botox and will have to give it time.  Sometimes, headaches may get worse a little bit after the first dose of Botox, but should then improve.

## 2015-08-13 NOTE — Telephone Encounter (Signed)
Pt called back today. States she needs help. She is exhausted. Has had a migraine x 1 month. It is constant. She missed Monday's appointment due to nausea and head pain. Pt is taking atenolol 100 mg daily. She is taking Ultracet for pain, which has not helped pain level. Pt states she has also tried BC powder over the last month. Please advise.

## 2015-08-13 NOTE — Telephone Encounter (Signed)
Pt aware. New Rx sent in. Also sent mychart message w/ directions for patient.

## 2015-08-13 NOTE — Telephone Encounter (Signed)
She should take 10mg  Celexa daily and then stop At the same time, she can start venlafaxine XR 37.5mg .  Take 1 capsule daily for 7 days, then increase to 2 capsules (75mg  total) daily

## 2015-08-13 NOTE — Telephone Encounter (Signed)
Spoke with patient. She is off of Cymbalta now, just taking the Celexa, but she is agreeable to switch to a medication that can help treat both. Please advise.

## 2015-08-13 NOTE — Addendum Note (Signed)
Addended by: Gerda Diss A on: 08/13/2015 03:10 PM   Modules accepted: Orders

## 2015-08-25 ENCOUNTER — Other Ambulatory Visit: Payer: Self-pay | Admitting: Internal Medicine

## 2015-08-26 ENCOUNTER — Telehealth: Payer: Self-pay

## 2015-08-26 MED ORDER — PREDNISONE 10 MG PO TABS: ORAL_TABLET | ORAL | 0 refills | Status: DC

## 2015-08-26 NOTE — Telephone Encounter (Signed)
Message relayed to patient. Verbalized understanding and denied questions. RX sen to pharmacy.

## 2015-08-26 NOTE — Telephone Encounter (Signed)
If she continues to take [facility] powder daily, then the headaches will remain daily.  We can prescribe her a prednisone taper to see if it can help relieve it (prednisone 10mg  tablets:  6tabs x1day, then 5tabs x1day, then 4tabs x1day, then 3tabs x1day, then 2tabs x1day, then 1tab x1day, then STOP)

## 2015-08-26 NOTE — Telephone Encounter (Signed)
Pt left vm, with complaints of migraine. Pt's medication was changed on August 11, 2015 from Celexa to Venlafaxine. Pt stated that she knows it will take time for medication to get in system and help but she is exhausted. Pt stated that she worked from home every day last week, and so far this week, because she couldn't leave her house to do migraine. Pt stated that the ultracet has not been giving her any relief either, and so she has been taking BC powder almost daily with some relief. Please advise.

## 2015-08-28 ENCOUNTER — Other Ambulatory Visit: Payer: Self-pay | Admitting: Family Medicine

## 2015-08-28 ENCOUNTER — Telehealth: Payer: Self-pay | Admitting: Family Medicine

## 2015-08-28 DIAGNOSIS — Z0000 Encounter for general adult medical examination without abnormal findings: Secondary | ICD-10-CM

## 2015-08-28 NOTE — Telephone Encounter (Signed)
lmom for pt to call back

## 2015-08-28 NOTE — Telephone Encounter (Signed)
Pt now due for cpx and lab work. I have placed the lab orders.  Please help the pt to make both appointments.  Thanks!!!

## 2015-08-28 NOTE — Telephone Encounter (Signed)
Sent to the pharmacy by e-scribe for 1 month.  Message sent to scheduling.  Pt now due for cpx and lab work.

## 2015-09-01 ENCOUNTER — Telehealth: Payer: Self-pay | Admitting: Internal Medicine

## 2015-09-01 NOTE — Telephone Encounter (Signed)
lmom for pt to call back

## 2015-09-05 NOTE — Telephone Encounter (Signed)
Pt has been sch

## 2015-09-08 ENCOUNTER — Other Ambulatory Visit: Payer: Self-pay | Admitting: Neurology

## 2015-09-11 ENCOUNTER — Other Ambulatory Visit: Payer: Self-pay

## 2015-09-16 ENCOUNTER — Telehealth: Payer: Self-pay | Admitting: Neurology

## 2015-09-16 ENCOUNTER — Encounter: Payer: Self-pay | Admitting: Neurology

## 2015-09-16 NOTE — Telephone Encounter (Signed)
Please see message from patient

## 2015-09-16 NOTE — Telephone Encounter (Signed)
Pt would like letter stating that until she is able to get her migraines under control she should be allowed to work from home. Please advise.

## 2015-09-16 NOTE — Telephone Encounter (Signed)
We can give her a letter stating that until her migraines are better controlled, it is recommended that she work from home.

## 2015-09-16 NOTE — Telephone Encounter (Signed)
Needs to talk to you about Mychart message. Please call

## 2015-09-16 NOTE — Telephone Encounter (Signed)
What specifically is she asking?

## 2015-09-16 NOTE — Telephone Encounter (Signed)
Letter written and sent via mychart  

## 2015-09-24 ENCOUNTER — Encounter: Payer: Self-pay | Admitting: Neurology

## 2015-09-24 ENCOUNTER — Other Ambulatory Visit: Payer: Self-pay | Admitting: Internal Medicine

## 2015-09-24 NOTE — Telephone Encounter (Signed)
Sent to the pharmacy by e-scribe for 2 months.  Pt has upcoming cpx on 11/18/15.

## 2015-09-25 ENCOUNTER — Telehealth: Payer: Self-pay | Admitting: Internal Medicine

## 2015-09-25 MED ORDER — TRAMADOL-ACETAMINOPHEN 37.5-325 MG PO TABS: 2.0000 | ORAL_TABLET | Freq: Four times a day (QID) | ORAL | 0 refills | Status: DC | PRN

## 2015-09-25 NOTE — Telephone Encounter (Signed)
Patient Name: [name] [facility]  DOB: Mar 22, 1970    Initial Comment Caller States has headache, will the Dr give her some meds for it , other Dr is out of town she can't get them    Nurse Assessment  Nurse: Raphael Gibney, RN, Vanita Ingles Date/Time (Eastern Time): 09/25/2015 2:03:59 PM  Confirm and document reason for call. If symptomatic, describe symptoms. You must click the next button to save text entered. ---Caller states she has chronic migraines. Migraine has gotten worse. Her neurologist is out of the town until next week. She has gotten prednisone for her migraines before.  Has the patient traveled out of the country within the last 30 days? ---Not Applicable  Does the patient have any new or worsening symptoms? ---Yes  Will a triage be completed? ---Yes  Related visit to physician within the last 2 weeks? ---No  Does the PT have any chronic conditions? (i.e. diabetes, asthma, etc.) ---Yes  List chronic conditions. ---chronic migraines  Is the patient pregnant or possibly pregnant? (Ask all females between the ages of 52-55) ---No  Is this a behavioral health or substance abuse call? ---No     Guidelines    Guideline Title Affirmed Question Affirmed Notes  Headache [1] SEVERE headache (e.g., excruciating) AND [2] not improved after 2 hours of pain medicine    Final Disposition User   See Physician within 4 Hours (or PCP triage) Raphael Gibney, RN, Vera    Comments  pt does not want to come into office but states she would like prednisone called in. Her neurologist is out of town until next week. Please call pt back regarding medication.   Referrals  GO TO FACILITY REFUSED   Disagree/Comply: Disagree  Disagree/Comply Reason: Disagree with instructions

## 2015-09-25 NOTE — Telephone Encounter (Signed)
Spoke to pt, told her Dr. Regis Bill is out of the office also and that I did discuss with Dr.Hunter and he said being as you just completed course of Prednisone from 8/1 that was given. He said you need to wait till Dr.Jaffe comes back next week to discuss with him. Pt verbalized understanding.

## 2015-09-26 ENCOUNTER — Ambulatory Visit: Payer: BLUE CROSS/BLUE SHIELD | Admitting: Obstetrics and Gynecology

## 2015-09-26 NOTE — Telephone Encounter (Signed)
error 

## 2015-10-03 ENCOUNTER — Ambulatory Visit (INDEPENDENT_AMBULATORY_CARE_PROVIDER_SITE_OTHER): Payer: BLUE CROSS/BLUE SHIELD | Admitting: Neurology

## 2015-10-03 DIAGNOSIS — G43419 Hemiplegic migraine, intractable, without status migrainosus: Secondary | ICD-10-CM | POA: Diagnosis not present

## 2015-10-03 MED ORDER — ONABOTULINUMTOXINA 100 UNITS IJ SOLR
155.0000 [IU] | Freq: Once | INTRAMUSCULAR | Status: AC
  Administered 2015-10-03: 155 [IU] via INTRAMUSCULAR

## 2015-10-03 MED ORDER — VENLAFAXINE HCL ER 150 MG PO CP24: 150.0000 mg | ORAL_CAPSULE | Freq: Every day | ORAL | 5 refills | Status: DC

## 2015-10-03 MED ORDER — ONABOTULINUMTOXINA 100 UNITS IJ SOLR: 200.0000 [IU] | Freq: Once | INTRAMUSCULAR | Status: DC

## 2015-10-03 NOTE — Progress Notes (Addendum)
[facility]   Procedure Note Botox  Attending: Dr. Tomi Likens  Preoperative Diagnosis(es): Chronic migraine  Consent obtained from: The patient Benefits discussed included, but were not limited to decreased muscle tightness, increased joint range of motion, and decreased pain.  Risk discussed included, but were not limited pain and discomfort, bleeding, bruising, excessive weakness, venous thrombosis, muscle atrophy and dysphagia.  Anticipated outcomes of the procedure as well as he risks and benefits of the alternatives to the procedure, and the roles and tasks of the personnel to be involved, were discussed with the patient, and the patient consents to the procedure and agrees to proceed. A copy of the patient medication guide was given to the patient which explains the blackbox warning.  Patients identity and treatment sites confirmed Yes.  .  Details of Procedure: Skin was cleaned with alcohol. Prior to injection, the needle plunger was aspirated to make sure the needle was not within a blood vessel.  There was no blood retrieved on aspiration.    Following is a summary of the muscles injected  And the amount of Botulinum toxin used:  Dilution 200 units of Botox was reconstituted with 4 ml of preservative free normal saline. Time of reconstitution: At the time of the office visit (<30 minutes prior to injection)   Injections  155 total units of Botox was injected with a 30 gauge needle.  Injection Sites: L occipitalis: 15 units- 3 sites  R occiptalis: 15 units- 3 sites  L upper trapezius: 15 units- 3 sites R upper trapezius: 15 units- 3 sits          L paraspinal: 10 units- 2 sites R paraspinal: 10 units- 2 sites  Face L frontalis(2 injection sites):10 units   R frontalis(2 injection sites):10 units         L corrugator: 5 units   R corrugator: 5 units           Procerus: 5 units   L temporalis: 20 units R temporalis: 20 units   Agent:  200 units of botulinum Type A  (Onobotulinum Toxin type A) was reconstituted with 4 ml of preservative free normal saline.  Time of reconstitution: At the time of the office visit (<30 minutes prior to injection)     Total injected (Units): 155  Total wasted (Units): 5  Patient tolerated procedure well without complications.   Reinjection is anticipated in 3 months.  [name] R. Renee Alcaide, DO  NOTE:  Will increase venlafaxine XR to 150mg  daily.

## 2015-10-07 ENCOUNTER — Other Ambulatory Visit: Payer: Self-pay | Admitting: Neurology

## 2015-10-08 ENCOUNTER — Encounter: Payer: Self-pay | Admitting: Neurology

## 2015-10-08 ENCOUNTER — Telehealth: Payer: Self-pay

## 2015-10-08 NOTE — Telephone Encounter (Signed)
Pease advise? Okay to write.

## 2015-10-08 NOTE — Telephone Encounter (Signed)
Yes, that is okay.

## 2015-10-17 ENCOUNTER — Ambulatory Visit: Payer: BLUE CROSS/BLUE SHIELD | Admitting: Obstetrics and Gynecology

## 2015-10-20 ENCOUNTER — Other Ambulatory Visit: Payer: Self-pay | Admitting: Internal Medicine

## 2015-11-15 ENCOUNTER — Other Ambulatory Visit: Payer: Self-pay | Admitting: Internal Medicine

## 2015-11-18 ENCOUNTER — Encounter: Payer: BLUE CROSS/BLUE SHIELD | Admitting: Internal Medicine

## 2015-11-18 NOTE — Telephone Encounter (Signed)
Pt scheduled for cpx 01/2016.  Please advise.

## 2015-11-19 NOTE — Telephone Encounter (Signed)
She is overdue for blood work but has been seen in the system in the last 6 months. Can refill medicine until January 2018 assuming she gets cpx labs   Please add hemoglobin A1c onto her CPX labs diagnosis hyperglycemia.

## 2015-11-20 NOTE — Telephone Encounter (Signed)
Pt need new Rx for pantoprazole and triamterene-hydrochlorothiazide and would like to have enough until her physical in January 2018  Pharm:  [facility]

## 2015-11-24 NOTE — Telephone Encounter (Signed)
I have answered this   See  My respoonse

## 2015-12-01 ENCOUNTER — Telehealth: Payer: Self-pay

## 2015-12-01 ENCOUNTER — Encounter: Payer: Self-pay | Admitting: Neurology

## 2015-12-01 NOTE — Telephone Encounter (Signed)
Mychart message from pt:   Hi Dr. Tomi Likens ,  I hope my message finds you well. I just wanted to give you an update since my second Botox injections. Fall and seasonal allergies have descended on me and the allergies are a trigger, so my head hurts pretty much all the time on the left side. Like there is a T cross ice pick going through the top of my head and through my temple and extreme pressure in the back of my left eye . I have notice the pain on my left side my face , my arm and especially my leg have increase significantly to the point it is keeping me up at night ( Lack of sleep is trigger as well) and the only thing that seems to help are BC powders that I take during the day to function. I have concerns about taking them , but I need to work and function. I have given up on the Tramadol/APAP it does not help at all. (there is a part 2)   Part 2  Some good news there is some improvement in my ability focus, train of thought is clearer than before. Less brain fog. I am still extremely frustrated with the migraines and my quality of life or maybe I should say the the lack of . I do believe because of the amount of time I have been struggling with the last bout of migraines, the pain , the uncertainty of everyday not knowing how my day is going to be, been at home mostly because I can 't be out and about has increased my symptoms of depression. I have scheduled and appt with a therapist because I am concerned. So very sick and tired of this.  Thanks   [employer]

## 2015-12-01 NOTE — Telephone Encounter (Signed)
Please advise 

## 2015-12-22 ENCOUNTER — Encounter: Payer: Self-pay | Admitting: Neurology

## 2015-12-22 NOTE — Telephone Encounter (Signed)
Okay to place referral? Please advise °

## 2015-12-25 ENCOUNTER — Encounter: Payer: Self-pay | Admitting: Neurology

## 2015-12-25 MED ORDER — GABAPENTIN 300 MG PO CAPS: 300.0000 mg | ORAL_CAPSULE | Freq: Two times a day (BID) | ORAL | 2 refills | Status: DC

## 2015-12-25 NOTE — Telephone Encounter (Signed)
Please see pt's response. Requesting Prednisone. Please advise.

## 2015-12-25 NOTE — Telephone Encounter (Signed)
Please advise 

## 2015-12-26 MED ORDER — PREDNISONE 10 MG PO TABS: ORAL_TABLET | ORAL | 0 refills | Status: DC

## 2015-12-26 NOTE — Telephone Encounter (Signed)
Rx sent to pharmacy   

## 2016-01-02 ENCOUNTER — Ambulatory Visit: Payer: BLUE CROSS/BLUE SHIELD | Admitting: Neurology

## 2016-01-10 ENCOUNTER — Encounter (HOSPITAL_COMMUNITY): Payer: Self-pay | Admitting: Emergency Medicine

## 2016-01-10 ENCOUNTER — Emergency Department (HOSPITAL_COMMUNITY)
Admission: EM | Admit: 2016-01-10 | Discharge: 2016-01-10 | Disposition: A | Discharge status: Home / Self Care | Payer: BLUE CROSS/BLUE SHIELD | Attending: Emergency Medicine | Admitting: Emergency Medicine

## 2016-01-10 DIAGNOSIS — I10 Essential (primary) hypertension: Secondary | ICD-10-CM | POA: Diagnosis not present

## 2016-01-10 DIAGNOSIS — G43809 Other migraine, not intractable, without status migrainosus: Secondary | ICD-10-CM | POA: Insufficient documentation

## 2016-01-10 DIAGNOSIS — R51 Headache: Secondary | ICD-10-CM | POA: Diagnosis present

## 2016-01-10 DIAGNOSIS — Z79899 Other long term (current) drug therapy: Secondary | ICD-10-CM | POA: Diagnosis not present

## 2016-01-10 MED ORDER — KETOROLAC TROMETHAMINE 60 MG/2ML IM SOLN
60.0000 mg | Freq: Once | INTRAMUSCULAR | Status: AC
  Administered 2016-01-10: 60 mg via INTRAMUSCULAR
  Filled 2016-01-10: qty 2

## 2016-01-10 MED ORDER — DIPHENHYDRAMINE HCL 50 MG/ML IJ SOLN
25.0000 mg | Freq: Once | INTRAMUSCULAR | Status: AC
  Administered 2016-01-10: 25 mg via INTRAMUSCULAR
  Filled 2016-01-10: qty 1

## 2016-01-10 MED ORDER — PROCHLORPERAZINE EDISYLATE 5 MG/ML IJ SOLN
10.0000 mg | Freq: Once | INTRAMUSCULAR | Status: AC
  Administered 2016-01-10: 10 mg via INTRAMUSCULAR
  Filled 2016-01-10: qty 2

## 2016-01-10 NOTE — Discharge Instructions (Signed)
Rest. Drink plenty of fluids. Follow up with your doctor as needed.

## 2016-01-10 NOTE — ED Triage Notes (Signed)
Pt c/o left sided migraine, states she has been having this one for 50 days now but is severe tonight. States she sees a specialist for them. Pain 10/10. C/o nausea no vomiitting.

## 2016-01-10 NOTE — ED Provider Notes (Signed)
[facility] DEPT Provider Note   CSN: [id number] Arrival date & time: 01/10/16  1905     History   Chief Complaint Chief Complaint  Patient presents with  . Migraine    HPI [name] is a 45 y.o. female.  HPI [name] is a 45 y.o. female with history of anemia, endometriosis, acid reflux, migraine headaches, presents to emergency department complaining of a headache. Patient states she developed headache yesterday. Pain is behind the left eye. Similar to prior migraine headaches. Currently seeing a specialist, states there taking her off of all of her medications and she believes that is what triggered her migraine. She reports left-sided tingling with this headache which she states is typical of her migraine. She denies any head injuries. No fever. No neck stiffness. No changes in vision. Reports some nausea no vomiting. Denies any trouble ambulating. She states she called her neurologist today, who told her to call her primary care doctor, who told her to go to the hospital.  Past Medical History:  Diagnosis Date  . Allergic rhinitis    hx of testing and allergic to spring and fall f= pollens  . Anemia    nos  . Anxiety   . Chest pain    neg stress test summer 2010  . Endometriosis   . Endometriosis   . Fibroids   . GERD (gastroesophageal reflux disease)   . Goiter   . Headache(784.0)   . Hyperlipidemia   . Hypertension   . Irritable bowel syndrome   . Labial cyst 04/08/2011   early infection use hotpcompresses    . Migraines   . Ovarian torsion   . Sinusitis   . STD (sexually transmitted disease)    Pos. Chlamydia--in her 13's  . Thyroid disease    goiter    Patient Active Problem List   Diagnosis Date Noted  . Morbid obesity ([place]) 01/30/2015  . 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 RUQ pain 02/25/2014  . Diarrhea 02/25/2014  . Calcium oxalate crystals in urine 02/22/2014  . Intramural leiomyoma of uterus  02/22/2014  . Hx of migraine headaches 02/04/2014  . Medication management 02/04/2014  . Hx of endometriosis 02/04/2014  . Right lower quadrant abdominal pain mid also 02/04/2014  . Adjustment disorder with anxious mood 01/17/2014  . Thyroid mass 02/16/2013  . Fever, unspecified 02/15/2013  . Sinusitis 02/15/2013  . Anxiety 12/11/2011  . Stress 12/11/2011  . Irregular periods 08/17/2011  . Endometriosis 08/17/2011  . Edema 05/10/2011  . Elevated blood pressure reading 05/10/2011  . Fatigue 04/08/2011  . Protracted URI 04/08/2011  . SINUSITIS, RECURRENT 11/06/2009  . FATIGUE 02/25/2009  . PALPITATIONS 02/25/2009  . DYSPNEA 02/25/2009  . KNEE INJURY, RIGHT 12/05/2007  . HEADACHE 05/03/2007  . ADULT SITUATIONAL REACTION 11/21/2006  . SLEEPLESSNESS 11/21/2006  . CHEST PAIN 11/21/2006  . TINEA PEDIS 08/31/2006  . Other and unspecified hyperlipidemia 08/31/2006  . DISORDER, ADJUSTMENT W/ANXIETY 08/31/2006  . PLANTAR FASCIITIS 08/31/2006  . ANEMIA-NOS 06/23/2006  . MIGRAINE, COMMON 06/23/2006  . ALLERGIC RHINITIS 06/23/2006  . GERD 06/23/2006  . IRRITABLE BOWEL SYNDROME 06/23/2006    Past Surgical History:  Procedure Laterality Date  . CESAREAN SECTION  2002, 2004   x2  . PELVIC LAPAROSCOPY     x9  . RADIOLOGY WITH ANESTHESIA N/A 09/05/2014   Procedure: MRI OF BRAIN WITHOUT CONTRAST;  Surgeon: Medication Radiologist, MD;  Location: [facility];  Service: Radiology;  Laterality: N/A;  MRI/DR. JAFFE  .  TUBAL LIGATION    . UNILATERAL SALPINGECTOMY     right  age 89 ectopics.    OB History    Gravida Para Term Preterm AB Living   7 2     5 2    SAB TAB Ectopic Multiple Live Births   1 1 3           Obstetric Comments   3 ectopic       Home Medications    Prior to Admission medications   Medication Sig Start Date End Date Taking? Authorizing Provider  atenolol (TENORMIN) 100 MG tablet TAKE 1 TABLET BY MOUTH EVERY DAY 10/08/15   Pieter Partridge, DO  citalopram (CELEXA) 20 MG  tablet Take 1/2 tablet daily for 7 days then stop. 08/13/15   Pieter Partridge, DO  Fish Oil OIL by Does not apply route.    Historical Provider, MD  fluticasone (FLONASE) 50 MCG/ACT nasal spray Place 2 sprays into both nostrils daily. Patient taking differently: Place 2 sprays into both nostrils daily as needed for allergies.  01/17/14   Burnis Medin, MD  gabapentin (NEURONTIN) 300 MG capsule Take 1 capsule (300 mg total) by mouth 2 (two) times daily. 12/25/15   Pieter Partridge, DO  Multiple Vitamin (MULTIVITAMIN WITH MINERALS) TABS tablet Take 1 tablet by mouth daily.    Historical Provider, MD  pantoprazole (PROTONIX) 40 MG tablet TAKE 1 TABLET BY MOUTH DAILY 11/20/15   Burnis Medin, MD  predniSONE (DELTASONE) 10 MG tablet 10mg  tablets. Take 6tabs x1day, then 5tabs x1day, then 4tabs x1day, then 3tabs x1day, then 2tabs x1day, then 1tab x1day, then STOP 12/26/15   Pieter Partridge, DO  promethazine (PHENERGAN) 25 MG tablet Take 1 tablet (25 mg total) by mouth every 6 (six) hours as needed for nausea or vomiting. 07/08/15   Pieter Partridge, DO  traMADol-acetaminophen (ULTRACET) 37.5-325 MG tablet Take 2 tablets by mouth every 6 (six) hours as needed. 09/25/15   Pieter Partridge, DO  triamterene-hydrochlorothiazide (MAXZIDE-25) 37.5-25 MG tablet TAKE 1 TABLET BY MOUTH DAILY 11/20/15   Burnis Medin, MD  TURMERIC PO Take by mouth.    Historical Provider, MD  venlafaxine XR (EFFEXOR-XR) 150 MG 24 hr capsule Take 1 capsule (150 mg total) by mouth daily with breakfast. 10/03/15   Pieter Partridge, DO    Family History Family History  Problem Relation Age of Onset  . Anxiety disorder Mother     mother is on xanax and apparently applying for  social security diabilty for her anxiety. t  . Depression Mother   . Diabetes Mother   . Hypertension Mother   . Heart attack Father   . Seizures Father   . Stroke Maternal Grandmother   . Diabetes Paternal Grandmother   . Hypertension Paternal Grandmother   . Coronary artery  disease    . Diabetes    . Hyperlipidemia    . Other      history of child death in family   . Colon cancer Neg Hx   . Stomach cancer Neg Hx   . Esophageal cancer Neg Hx     Social History Social History  Substance Use Topics  . Smoking status: Never Smoker  . Smokeless tobacco: Never Used  . Alcohol use No     Allergies   Codeine phosphate; Moxifloxacin; and Sulfamethoxazole   Review of Systems Review of Systems  Constitutional: Negative for chills and fever.  Eyes: Positive for photophobia. Negative for visual  disturbance.  Respiratory: Negative for cough, chest tightness and shortness of breath.   Cardiovascular: Negative for chest pain, palpitations and leg swelling.  Gastrointestinal: Positive for diarrhea. Negative for abdominal pain, nausea and vomiting.  Genitourinary: Negative for dysuria and flank pain.  Musculoskeletal: Negative for myalgias, neck pain and neck stiffness.  Skin: Negative for rash.  Neurological: Positive for numbness and headaches. Negative for dizziness and weakness.  All other systems reviewed and are negative.    Physical Exam Updated Vital Signs BP 138/87 (BP Location: Left Arm)   Pulse 65   Temp 97.9 F (36.6 C) (Oral)   Resp 16   SpO2 100%   Physical Exam  Constitutional: She is oriented to person, place, and time. She appears well-developed and well-nourished. No distress.  HENT:  Head: Normocephalic.  Right Ear: External ear normal.  Left Ear: External ear normal.  Nose: Nose normal.  Mouth/Throat: Oropharynx is clear and moist.  TMs are normal bilateral. Tenderness over maxillary sinus on the left.  Eyes: Conjunctivae and EOM are normal. Pupils are equal, round, and reactive to light.  Neck: Normal range of motion. Neck supple.  Cardiovascular: Normal rate, regular rhythm and normal heart sounds.   Pulmonary/Chest: Effort normal and breath sounds normal. No respiratory distress. She has no wheezes. She has no rales.    Abdominal: Soft. Bowel sounds are normal. She exhibits no distension. There is no tenderness. There is no rebound.  Musculoskeletal: She exhibits no edema.  Neurological: She is alert and oriented to person, place, and time.  5/5 and equal upper and lower extremity strength bilaterally. Equal grip strength bilaterally. Normal finger to nose and heel to shin. No pronator drift. Gait is normal  Skin: Skin is warm and dry. Capillary refill takes less than 2 seconds.  Psychiatric: She has a normal mood and affect. Her behavior is normal.  Nursing note and vitals reviewed.    ED Treatments / Results  Labs (all labs ordered are listed, but only abnormal results are displayed) Labs Reviewed - No data to display  EKG  EKG Interpretation None       Radiology No results found.  Procedures Procedures (including critical care time)  Medications Ordered in ED Medications  prochlorperazine (COMPAZINE) injection 10 mg (not administered)  ketorolac (TORADOL) injection 60 mg (not administered)  diphenhydrAMINE (BENADRYL) injection 25 mg (not administered)     Initial Impression / Assessment and Plan / ED Course  I have reviewed the triage vital signs and the nursing notes.  Pertinent labs & imaging results that were available during my care of the patient were reviewed by me and considered in my medical decision making (see chart for details).  Clinical Course    Patient in the emergency department with typical for her migraine. Normal neurological exam. No fever. No nuchal rigidity. No injuries to the head. States migraine cocktails usually help. We'll try IM Toradol, Compazine, Benadryl. Will reassess.  Patient feels much better after migraine cocktail. Pain resolved and patient would like to be discharged home. She will follow up with her neurologist on Monday. Return precautions discussed.  Vitals:   01/10/16 1910 01/10/16 2229  BP: 138/87   Pulse: 65 68  Resp: 16 18  Temp:  97.9 F (36.6 C)   TempSrc: Oral   SpO2: 100% 98%     Final Clinical Impressions(s) / ED Diagnoses   Final diagnoses:  Other migraine without status migrainosus, not intractable    New Prescriptions New Prescriptions  No medications on file     Jeannett Senior, PA-C 01/11/16 0041    Julianne Rice, MD 01/12/16 [phone]

## 2016-01-12 ENCOUNTER — Other Ambulatory Visit: Payer: BLUE CROSS/BLUE SHIELD

## 2016-01-12 ENCOUNTER — Telehealth: Payer: Self-pay

## 2016-01-12 NOTE — Telephone Encounter (Signed)
Pt called [facility] c/o migraine and wanting medication. Pt was advised to go to ED. Pt was seen and treated at [facility] ED. Nothing further needed at this time.    Date/Time [name] Time): 01/10/2016 5:45:26 PM Patient Name: [name] [facility] Gender: Female DOB: 12-31-1970 Age: 45 Y 9 M 22 D Return Phone Number: [phone] (Primary) Address: City/State/Zip: [place] Client [facility] Night - Client Client Site [place] - Night Physician [name] - MD Contact Type Call Who Is Calling Patient / Member / Family / Caregiver Call Type Triage / Clinical Relationship To Patient Self Return Phone Number [phone] (Primary) Chief Complaint Headache Reason for Call Symptomatic / Request for Health Information Initial Comment Caller States Migraine wants meds

## 2016-01-13 ENCOUNTER — Telehealth: Payer: Self-pay | Admitting: Internal Medicine

## 2016-01-13 NOTE — Telephone Encounter (Signed)
Patient Name: [name]  DOB: [birth year]    Initial Comment Caller states having migraine and resting heart rate is 115. Second day of new med for migraine and also had flu shot. Feels really strange and whole body is throbbing    Nurse Assessment      Guidelines    Guideline Title Affirmed Question Affirmed Notes       Final Disposition User   FINAL ATTEMPT MADE - message left Carmon, RN, Langley Gauss

## 2016-01-14 ENCOUNTER — Telehealth: Payer: Self-pay | Admitting: Neurology

## 2016-01-14 ENCOUNTER — Ambulatory Visit (INDEPENDENT_AMBULATORY_CARE_PROVIDER_SITE_OTHER): Payer: BLUE CROSS/BLUE SHIELD | Admitting: Neurology

## 2016-01-14 DIAGNOSIS — G43419 Hemiplegic migraine, intractable, without status migrainosus: Secondary | ICD-10-CM

## 2016-01-14 MED ORDER — ONABOTULINUMTOXINA 100 UNITS IJ SOLR
155.0000 [IU] | Freq: Once | INTRAMUSCULAR | Status: AC
  Administered 2016-01-14: 155 [IU] via INTRAMUSCULAR

## 2016-01-14 NOTE — Telephone Encounter (Signed)
Will wait until after pt sees Dr. Domingo Cocking. Dr. Tomi Likens is not sure Dr. Domingo Cocking does botox.

## 2016-01-14 NOTE — Telephone Encounter (Signed)
Spoke with [name] and she did talk to [facility] last night. She has been tapering off of Atenolol and did not take any yesterday. She believed this is the reason for her increased heart rate and "pulsing" sensation. Advised [name] that her blood pressure was likely slightly elevated which could cause this sensation. She states that she did go home and rest and is now feeling much better. She has an appt today with Dr Tomi Likens re: migraines. She will let him know what she has experienced the past few days. Nothing further needed at this time.

## 2016-01-14 NOTE — Telephone Encounter (Signed)
Patient is going to dr Domingo Cocking and we need to cancel the auth for the botox so they can get a auth for the botox

## 2016-01-14 NOTE — Telephone Encounter (Signed)
Please followup on patient's status

## 2016-01-14 NOTE — Progress Notes (Signed)
[facility]   Procedure Note Botox  Attending: Dr. Tomi Likens  Preoperative Diagnosis(es): Chronic migraine  Consent obtained from: The patient Benefits discussed included, but were not limited to decreased muscle tightness, increased joint range of motion, and decreased pain.  Risk discussed included, but were not limited pain and discomfort, bleeding, bruising, excessive weakness, venous thrombosis, muscle atrophy and dysphagia.  Anticipated outcomes of the procedure as well as he risks and benefits of the alternatives to the procedure, and the roles and tasks of the personnel to be involved, were discussed with the patient, and the patient consents to the procedure and agrees to proceed. A copy of the patient medication guide was given to the patient which explains the blackbox warning.  Patients identity and treatment sites confirmed Yes.  .  Details of Procedure: Skin was cleaned with alcohol. Prior to injection, the needle plunger was aspirated to make sure the needle was not within a blood vessel.  There was no blood retrieved on aspiration.    Following is a summary of the muscles injected  And the amount of Botulinum toxin used:  Dilution 200 units of Botox was reconstituted with 4 ml of preservative free normal saline. Time of reconstitution: At the time of the office visit (<30 minutes prior to injection)   Injections  155 total units of Botox was injected with a 30 gauge needle.  Injection Sites: L occipitalis: 15 units- 3 sites  R occiptalis: 15 units- 3 sites  L upper trapezius: 15 units- 3 sites R upper trapezius: 15 units- 3 sits          L paraspinal: 10 units- 2 sites R paraspinal: 10 units- 2 sites  Face L frontalis(2 injection sites):10 units   R frontalis(2 injection sites):10 units         L corrugator: 5 units   R corrugator: 5 units           Procerus: 5 units   L temporalis: 20 units R temporalis: 20 units   Agent:  200 units of botulinum Type A  (Onobotulinum Toxin type A) was reconstituted with 4 ml of preservative free normal saline.  Time of reconstitution: At the time of the office visit (<30 minutes prior to injection)     Total injected (Units): 155  Total wasted (Units): 45  Patient tolerated procedure well without complications.    Adam R. Tomi Likens, DO

## 2016-01-22 ENCOUNTER — Telehealth: Payer: Self-pay | Admitting: Neurology

## 2016-01-22 ENCOUNTER — Telehealth: Payer: Self-pay | Admitting: Internal Medicine

## 2016-01-22 NOTE — Telephone Encounter (Signed)
Patient Name: Loren [facility] DOB: 1971/01/12 Initial Comment Caller says, migraine patient, has been taken off her Rx, but her resting heart rate is too high, this has been going on for a week and a half. Nurse Assessment Nurse: Ronnald Ramp, RN, Miranda Date/Time (Eastern Time): 01/22/2016 1:10:47 PM Confirm and document reason for call. If symptomatic, describe symptoms. ---Caller states she has migraines and had been on Atenolol for the headache. She is seeing a new neurologist and he weaned her off the Atenolol. Since that time, her heart rate has been high. HR 122 after walking from 1 room to the next. Her normal is in the 60's. When her HR goes up, she can feel pounding in her head. Does the patient have any new or worsening symptoms? ---Yes Will a triage be completed? ---Yes Related visit to physician within the last 2 weeks? ---No Does the PT have any chronic conditions? (i.e. diabetes, asthma, etc.) ---Yes List chronic conditions. ---Migraines, GERD, Anxiety Is the patient pregnant or possibly pregnant? (Ask all females between the ages of 17-55) ---No Is this a behavioral health or substance abuse call? ---No Guidelines Guideline Title Affirmed Question Affirmed Notes Heart Rate and Heartbeat Questions History of hyperthyroidism or taking thyroid medication Final Disposition User See PCP When Office is Open (within 3 days) Ronnald Ramp, RN, Miranda Comments No appt available with PCP. Appt scheduled for tomorrow 12/29 at 9:45am with Dr. Maudie Mercury Referrals REFERRED TO PCP OFFICE Disagree/Comply: Comply

## 2016-01-22 NOTE — Telephone Encounter (Signed)
Noted  

## 2016-01-22 NOTE — Telephone Encounter (Signed)
Pt called requesting we cancel our auth on her Botox as she will be getting it from Dr. Domingo Cocking.  I have called her [employer] and our Botox Josem Kaufmann are cancelled.  I let the patient know this has been completed.

## 2016-01-23 ENCOUNTER — Ambulatory Visit: Payer: Self-pay | Admitting: Family Medicine

## 2016-01-26 DIAGNOSIS — R760 Raised antibody titer: Secondary | ICD-10-CM

## 2016-01-26 HISTORY — DX: Raised antibody titer: R76.0

## 2016-02-09 ENCOUNTER — Other Ambulatory Visit (INDEPENDENT_AMBULATORY_CARE_PROVIDER_SITE_OTHER): Payer: BLUE CROSS/BLUE SHIELD

## 2016-02-09 DIAGNOSIS — R7989 Other specified abnormal findings of blood chemistry: Secondary | ICD-10-CM | POA: Diagnosis not present

## 2016-02-09 DIAGNOSIS — Z0000 Encounter for general adult medical examination without abnormal findings: Secondary | ICD-10-CM | POA: Diagnosis not present

## 2016-02-09 LAB — CBC WITH DIFFERENTIAL/PLATELET
Basophils Absolute: 0.1 10*3/uL (ref 0.0–0.1)
Basophils Relative: 1 % (ref 0.0–3.0)
EOS PCT: 2.4 % (ref 0.0–5.0)
Eosinophils Absolute: 0.2 10*3/uL (ref 0.0–0.7)
HCT: 40.7 % (ref 36.0–46.0)
Hemoglobin: 13.8 g/dL (ref 12.0–15.0)
LYMPHS ABS: 4.5 10*3/uL — AB (ref 0.7–4.0)
Lymphocytes Relative: 44.9 % (ref 12.0–46.0)
MCHC: 33.8 g/dL (ref 30.0–36.0)
MCV: 90.6 fl (ref 78.0–100.0)
MONO ABS: 0.6 10*3/uL (ref 0.1–1.0)
MONOS PCT: 5.7 % (ref 3.0–12.0)
NEUTROS PCT: 46 % (ref 43.0–77.0)
Neutro Abs: 4.6 10*3/uL (ref 1.4–7.7)
Platelets: 468 10*3/uL — ABNORMAL HIGH (ref 150.0–400.0)
RBC: 4.49 Mil/uL (ref 3.87–5.11)
RDW: 14.1 % (ref 11.5–15.5)
WBC: 10 10*3/uL (ref 4.0–10.5)

## 2016-02-09 LAB — LIPID PANEL
CHOLESTEROL: 228 mg/dL — AB (ref 0–200)
HDL: 34 mg/dL — ABNORMAL LOW (ref >39.00)
NonHDL: 193.8
Total CHOL/HDL Ratio: 7
Triglycerides: 219 mg/dL — ABNORMAL HIGH (ref 0.0–149.0)
VLDL: 43.8 mg/dL — ABNORMAL HIGH (ref 0.0–40.0)

## 2016-02-09 LAB — BASIC METABOLIC PANEL
BUN: 11 mg/dL (ref 6–23)
CO2: 24 meq/L (ref 19–32)
Calcium: 9.3 mg/dL (ref 8.4–10.5)
Chloride: 103 mEq/L (ref 96–112)
Creatinine, Ser: 0.99 mg/dL (ref 0.40–1.20)
GFR: 78.05 mL/min (ref >60.00)
GLUCOSE: 122 mg/dL — AB (ref 70–99)
POTASSIUM: 3.9 meq/L (ref 3.5–5.1)
SODIUM: 137 meq/L (ref 135–145)

## 2016-02-09 LAB — HEPATIC FUNCTION PANEL
ALK PHOS: 54 U/L (ref 39–117)
ALT: 14 U/L (ref 0–35)
AST: 15 U/L (ref 0–37)
Albumin: 4.1 g/dL (ref 3.5–5.2)
BILIRUBIN DIRECT: 0.1 mg/dL (ref 0.0–0.3)
Total Bilirubin: 0.3 mg/dL (ref 0.2–1.2)
Total Protein: 6.9 g/dL (ref 6.0–8.3)

## 2016-02-09 LAB — TSH: TSH: 2.21 u[IU]/mL (ref 0.35–4.50)

## 2016-02-09 LAB — LDL CHOLESTEROL, DIRECT: Direct LDL: 145 mg/dL

## 2016-02-10 ENCOUNTER — Ambulatory Visit (INDEPENDENT_AMBULATORY_CARE_PROVIDER_SITE_OTHER): Payer: BLUE CROSS/BLUE SHIELD | Admitting: Internal Medicine

## 2016-02-10 ENCOUNTER — Telehealth: Payer: Self-pay | Admitting: Internal Medicine

## 2016-02-10 ENCOUNTER — Encounter: Payer: Self-pay | Admitting: Internal Medicine

## 2016-02-10 VITALS — BP 124/86 | HR 105 | Temp 97.9°F | Wt 245.0 lb

## 2016-02-10 DIAGNOSIS — Z79899 Other long term (current) drug therapy: Secondary | ICD-10-CM | POA: Diagnosis not present

## 2016-02-10 DIAGNOSIS — R000 Tachycardia, unspecified: Secondary | ICD-10-CM | POA: Diagnosis not present

## 2016-02-10 DIAGNOSIS — R0989 Other specified symptoms and signs involving the circulatory and respiratory systems: Secondary | ICD-10-CM

## 2016-02-10 DIAGNOSIS — Z8669 Personal history of other diseases of the nervous system and sense organs: Secondary | ICD-10-CM

## 2016-02-10 NOTE — Patient Instructions (Addendum)
Not sure  Why your heart rate is not back to normal . your blood pressure os acceptable  Today.  Can conssider going back on low dose   b blocker  Metoprolol     But would wait at this time  We may consider  More eva;luation such as   Echo test of heart .    Stay hydrated .  BP Readings from Last 3 Encounters:  02/10/16 124/86  01/10/16 138/87  07/30/15 112/70

## 2016-02-10 NOTE — Telephone Encounter (Signed)
Patient Name: [name] [facility]  DOB: 1970/03/05    Initial Comment Caller states 174/104 at [employer]; having dizzy spells all day;    Nurse Assessment  Nurse: Leilani Merl, RN, Heather Date/Time (Eastern Time): 02/10/2016 2:26:00 PM  Confirm and document reason for call. If symptomatic, describe symptoms. ---Caller states 174/104 at [facility]; having dizzy spells all day. She was taken off a beta blocker, atenolol for her migraines on 01/09/16  Does the patient have any new or worsening symptoms? ---Yes  Will a triage be completed? ---Yes  Related visit to physician within the last 2 weeks? ---No  Does the PT have any chronic conditions? (i.e. diabetes, asthma, etc.) ---Yes  List chronic conditions. ---migraines, see MR  Is the patient pregnant or possibly pregnant? (Ask all females between the ages of 37-55) ---No  Is this a behavioral health or substance abuse call? ---No     Guidelines    Guideline Title Affirmed Question Affirmed Notes  High Blood Pressure BP ? 160/100   Dizziness - Lightheadedness [1] Dizziness caused by heat exposure, sudden standing, or poor fluid intake AND [2] no improvement after 2 hours of rest and fluids    Final Disposition User   See Physician within 4 Hours (or PCP triage) Leilani Merl, RN, Heather    Comments  Appt with Dr. Regis Bill at 3:30 pm today   Referrals  REFERRED TO PCP OFFICE   Disagree/Comply: Comply    Disagree/Comply: Comply

## 2016-02-10 NOTE — Progress Notes (Signed)
Pre visit review using our clinic review tool, if applicable. No additional management support is needed unless otherwise documented below in the visit note.  Chief Complaint  Patient presents with  . High Blood Pressure Reading    [facility] found to bew 174/104 with heart rate of 111  . Dizziness  . Elevated heart Rate    HPI: [name] 46 y.o.  SDA  appt   For dizzziness  And inc pulse   Last ov with me was 8 2016 for PV  Has been under care for dr Loretta Plume and neuro for   Intractable migrains    And then was referred to dr Domingo Cocking  For more advice on management She also sees gyne   Noted   Hr elevated after   Stopping atenolol    Dec 50 qd and then  Stop  No spells .  stopped celexa    Weaned and Changed  To effexor.  150  Summer.2017     Dr Domingo Cocking   Not managing the effexor but no objecting.  Prefers she stay off new med if possible    Med manipulation   Called [facility]   And snet in for  evlaution .  [facility]   No syncope bleeding   . Works out of home  No tad . Medicaid  Managing  Investment banker, corporate.  Mostly teens and peds.  ROS: See pertinent positives and negatives per HPI. No cp sob at thist ime told she should lose weight  No processed foods   Past Medical History:  Diagnosis Date  . Allergic rhinitis    hx of testing and allergic to spring and fall f= pollens  . Anemia    nos  . Anxiety   . Chest pain    neg stress test summer 2010  . Endometriosis   . Endometriosis   . Fibroids   . GERD (gastroesophageal reflux disease)   . Goiter   . Headache(784.0)   . Hyperlipidemia   . Hypertension   . Irritable bowel syndrome   . Labial cyst 04/08/2011   early infection use hotpcompresses    . Migraines   . Ovarian torsion   . Sinusitis   . STD (sexually transmitted disease)    Pos. Chlamydia--in her 25's  . Thyroid disease    goiter    Family History  Problem Relation Age of Onset  . Anxiety disorder Mother    mother is on xanax and apparently applying for  social security diabilty for her anxiety. t  . Depression Mother   . Diabetes Mother   . Hypertension Mother   . Heart attack Father   . Seizures Father   . Stroke Maternal Grandmother   . Diabetes Paternal Grandmother   . Hypertension Paternal Grandmother   . Coronary artery disease    . Diabetes    . Hyperlipidemia    . Other      history of child death in family   . Colon cancer Neg Hx   . Stomach cancer Neg Hx   . Esophageal cancer Neg Hx     Social History   Social History  . Marital status: Single    Spouse name: N/A  . Number of children: N/A  . Years of education: N/A   Social History Main Topics  . Smoking status: Never Smoker  . Smokeless tobacco: Never Used  . Alcohol use No  .  Drug use: No  . Sexual activity: Yes    Partners: Male    Birth control/ protection: Surgical     Comment: Tubal   Other Topics Concern  . 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   And graduated as SW.      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 Multiple Vitamin (MULTIVITAMIN WITH MINERALS) TABS tablet Take 1 tablet by mouth daily.    . pantoprazole (PROTONIX) 40 MG tablet TAKE 1 TABLET BY MOUTH DAILY 30 tablet 5  . promethazine (PHENERGAN) 25 MG tablet Take 1 tablet (25 mg total) by mouth every 6 (six) hours as needed for nausea or vomiting. 60 tablet 1  . traMADol-acetaminophen (ULTRACET) 37.5-325 MG  tablet Take 2 tablets by mouth every 6 (six) hours as needed. 15 tablet 0  . triamterene-hydrochlorothiazide (MAXZIDE-25) 37.5-25 MG tablet TAKE 1 TABLET BY MOUTH DAILY 30 tablet 5  . venlafaxine XR (EFFEXOR-XR) 150 MG 24 hr capsule Take 1 capsule (150 mg total) by mouth daily with breakfast. 30 capsule 5  . atenolol (TENORMIN) 100 MG tablet TAKE 1 TABLET BY MOUTH EVERY DAY 30 tablet 3  . citalopram (CELEXA) 20 MG tablet Take 1/2 tablet daily for 7 days then stop. 7 tablet 0  . Fish Oil OIL by Does not apply route.    . fluticasone (FLONASE) 50 MCG/ACT nasal spray Place 2 sprays into both nostrils daily. (Patient taking differently: Place 2 sprays into both nostrils daily as needed for allergies. ) 1 g prn  . gabapentin (NEURONTIN) 300 MG capsule Take 1 capsule (300 mg total) by mouth 2 (two) times daily. 60 capsule 2  . predniSONE (DELTASONE) 10 MG tablet 10mg  tablets. Take 6tabs x1day, then 5tabs x1day, then 4tabs x1day, then 3tabs x1day, then 2tabs x1day, then 1tab x1day, then STOP 21 tablet 0  . TURMERIC PO Take by mouth.     No facility-administered medications prior to visit.      EXAM:  BP 124/86 (BP Location: Left Arm, Patient Position: Sitting, Cuff Size: Large)   Pulse (!) 105   Temp 97.9 F (36.6 C) (Oral)   Wt 245 lb (111.1 kg)   SpO2 98%   BMI 39.54 kg/m   Body mass index is 39.54 kg/m.  GENERAL: vitals reviewed and listed above, alert, oriented, appears well hydrated and in no acute distress HEENT: atraumatic, conjunctiva  clear, no obvious abnormalities on inspection of external nose and ears NECK: no obvious masses on inspection palpation  LUNGS: clear to auscultation bilaterally, no wheezes, rales or rhonchi,  CV: HRRR, no clubbing cyanosis or  peripheral edema nl cap refill  Rate 100  MS: moves all extremities without noticeable focal  abnormality PSYCH: pleasant and cooperative, no obvious depression or anxiety Lab Results  Component Value Date   WBC 10.0  02/09/2016   HGB 13.8 02/09/2016   HCT 40.7 02/09/2016   PLT 468.0 (H) 02/09/2016   GLUCOSE 122 (H) 02/09/2016   CHOL 228 (H) 02/09/2016   TRIG 219.0 (H) 02/09/2016   HDL 34.00 (L) 02/09/2016   LDLDIRECT 145.0 02/09/2016   LDLCALC 148 (H) 09/23/2014   ALT 14 02/09/2016   AST 15 02/09/2016   NA 137 02/09/2016   K 3.9 02/09/2016   CL 103 02/09/2016   CREATININE 0.99 02/09/2016   BUN 11 02/09/2016   CO2 24 02/09/2016   TSH 2.21 02/09/2016   HGBA1C 5.8 08/24/2006   BP Readings from Last 3 Encounters:  02/10/16 124/86  01/10/16 138/87  07/30/15 112/70   Wt Readings from Last 3 Encounters:  02/10/16 245 lb (111.1 kg)  07/30/15 247 lb 9.6 oz (112.3 kg)  05/13/15 246 lb (111.6 kg)  EKG SR rate 98  Nl intervals     ASSESSMENT AND PLAN:  Discussed the following assessment and plan:  Tachycardia - Plan: EKG 12-Lead  Labile blood pressure - Plan: EKG 12-Lead  Medication management  Hx of atypical migraine No obvious underlying cardiomyopathy your disease however will follow. Consideration of beta blocker echo etc. but at this time have her do her lifestyle intervention as planned and sitter [employer]. Plan follow-up visit at her checkup as planned. Keep appointment with Dr. Domingo Cocking and will maintain on Effexor at this time. Doesn't sound like svt ,. Or arryntmia  Certainly medication management and changes could be causing her symptoms but should fade with time.  -Patient advised to return or notify health care team  if symptoms worsen ,persist or new concerns arise.  Patient Instructions   Not sure  Why your heart rate is not back to normal . your blood pressure os acceptable  Today.  Can conssider going back on low dose   b blocker  Metoprolol     But would wait at this time  We may consider  More eva;luation such as   Echo test of heart .    Stay hydrated .  BP Readings from Last 3 Encounters:  02/10/16 124/86  01/10/16 138/87  07/30/15 112/70         Mariann Laster K. Malya Cirillo M.D.

## 2016-02-10 NOTE — Telephone Encounter (Signed)
Noted  

## 2016-02-17 NOTE — Progress Notes (Signed)
Pre visit review using our clinic review tool, if applicable. No additional management support is needed unless otherwise documented below in the visit note.  Chief Complaint  Patient presents with  . Annual Exam    HPI: Patient  [name]  46 y.o. comes in today for Preventive Health Care visit  Since last visit she has less frequent rapid heart rate last about 5 or 6 seconds with no associated symptoms are occurring a few times a day. Doesn't wake her up. Has joined [employer] beginning to work on that. Seeing Dr. Domingo Cocking frequently in regard to her atypical migraines. She has a family history of diabetes strong and is interested in preventing. She's no longer taking Benadryl is never slept well at night but will work on it. Denies sleep apnea. Sees gyne for   Fibroids bleeding  Now has IUD Thyroid  Last [place] 2015 stable  Neg bx In past Has seen counselor for unresolved issues   To find another in network   Health Maintenance  Topic Date Due  . MAMMOGRAM  12/04/2014  . HIV Screening  02/16/2017 (Originally 03/20/1985)  . PAP SMEAR  09/10/2016  . TETANUS/TDAP  02/17/2026  . 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 GI /GU: No adominal pain, vomiting, change in bowel habits. No blood in the stool. No significant GU symptoms. SKIN/HEME: ,no acute skin rashes suspicious lesions or bleeding. No lymphadenopathy, nodules, masses.  NEURO/ PSYCH:  No neurologic signs such as weakness numbness. No depression anxiety. IMM/ Allergy: No unusual infections.  Allergy .   REST of 12 system  review negative except as per HPI   Past Medical History:  Diagnosis Date  . Allergic rhinitis    hx of testing and allergic to spring and fall f= pollens  . Anemia    nos  . Anxiety   . Chest pain    neg stress test summer 2010  . Endometriosis   . Endometriosis   . Fibroids   . GERD (gastroesophageal reflux disease)   . Goiter   . Headache(784.0)   . Hyperlipidemia   . Hypertension   . Irritable bowel syndrome   . Labial cyst 04/08/2011   early infection use hotpcompresses    . Migraines   . Ovarian torsion   . Sinusitis   . STD (sexually transmitted disease)    Pos. Chlamydia--in her 45's  . Thyroid disease    goiter    Past Surgical History:  Procedure Laterality Date  . CESAREAN SECTION  2002, 2004   x2  . PELVIC LAPAROSCOPY     x9  . RADIOLOGY WITH ANESTHESIA N/A 09/05/2014   Procedure: MRI OF BRAIN WITHOUT CONTRAST;  Surgeon: Medication Radiologist, MD;  Location: [facility];  Service: Radiology;  Laterality: N/A;  MRI/DR. JAFFE  . TUBAL LIGATION    . UNILATERAL SALPINGECTOMY     right  age 31 ectopics.    Family History  Problem Relation Age of Onset  . Anxiety disorder Mother     mother is on xanax and apparently applying for  social security  diabilty for her anxiety. t  . Depression Mother   . Diabetes Mother   . Hypertension Mother   . Heart attack Father   . Seizures Father   . Stroke Maternal Grandmother   . Diabetes Paternal Grandmother   . Hypertension Paternal Grandmother   . Coronary artery disease    . Diabetes    . Hyperlipidemia    . Other      history of child death in family   . Colon cancer Neg Hx   . Stomach cancer Neg Hx   . Esophageal cancer Neg Hx     Social History   Social History  . Marital status: Single    Spouse name: N/A  . Number of children: N/A  . Years of education: N/A   Social History Main Topics  . Smoking status: Never Smoker  . Smokeless tobacco: Never Used  . Alcohol use No  . Drug use: No  . Sexual  activity: Yes    Partners: Male    Birth control/ protection: Surgical     Comment: Tubal   Other Topics Concern  . 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   And graduated as SW.      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 baclofen (LIORESAL) 10 MG tablet take ONE-HALF to 1 TAB AS NEEDED to TWICE DAILY, headache. Limit to 2 headache days per week. Avoid daily use.  0  . chlorproMAZINE (THORAZINE) 25 MG tablet take 1-2 TABLET BY MOUTH AS NEEDED for headache to TWICE DAILY and avoid daily use. limit use to 1-2 days per week  0  . Multiple Vitamin (MULTIVITAMIN WITH MINERALS) TABS tablet Take 1 tablet by mouth daily.    . promethazine (PHENERGAN) 25 MG tablet Take 1 tablet (25 mg total) by mouth every 6 (six) hours as needed for nausea or vomiting. 60 tablet 1  . zonisamide (ZONEGRAN) 25 MG capsule Take 2 capsules at bedtime; increase as directed.  1  . pantoprazole (PROTONIX) 40 MG tablet TAKE 1 TABLET BY MOUTH DAILY 30 tablet 5  . traMADol-acetaminophen (ULTRACET) 37.5-325 MG tablet Take 2 tablets by mouth every 6 (six) hours as needed. 15 tablet 0  . triamterene-hydrochlorothiazide (MAXZIDE-25) 37.5-25 MG tablet TAKE 1 TABLET BY MOUTH DAILY 30 tablet 5  . venlafaxine XR (EFFEXOR-XR) 150 MG 24 hr capsule Take 1 capsule (150 mg total) by mouth daily with breakfast. 30 capsule 5   No facility-administered medications prior to visit.      EXAM:  BP 126/82 (BP  Location: Right Arm, Patient Position: Sitting, Cuff Size: Large)   Temp 97.9 F (36.6 C) (Oral)   Ht 5' 5.75" (1.67 m)   Wt 245 lb (111.1 kg)   BMI 39.85 kg/m   Body  mass index is 39.85 kg/m.  Physical Exam: Vital signs reviewed [id number] is a well-developed well-nourished alert cooperative    who appearsr stated age in no acute distress.  HEENT: normocephalic atraumatic , Eyes: PERRL EOM's full, conjunctiva clear, Nares: paten,t no deformity discharge or tenderness., Ears: no deformity EAC's clear TMs with normal landmarks. Mouth: clear OP, no lesions, edema.  Moist mucous membranes. Dentition in adequate repair. NECK: supple without masses, thyromegaly or bruits. Thyroid palapble  CHEST/PULM:  Clear to auscultation and percussion breath sounds equal no wheeze , rales or rhonchi. No chest wall deformities or tenderness.Breast: normal by inspection . No dimpling, discharge, masses, tenderness or discharge . CV: PMI is nondisplaced, S1 S2 no gallops, murmurs, rubs. Peripheral pulses are full without delay.No JVD .  ABDOMEN: Bowel sounds normal nontender  No guard or rebound, no hepato splenomegal no CVA tenderness.  No hernia. Extremtities:  No clubbing cyanosis or edema, no acute joint swelling or redness no focal atrophy NEURO:  Oriented x3, cranial nerves 3-12 appear to be intact, no obvious focal weakness,gait within normal limits no abnormal reflexes or asymmetrical SKIN: No acute rashes normal turgor, color, no bruising or petechiae. PSYCH: Oriented, good eye contact, no obvious depression anxiety, cognition and judgment appear normal. LN: no cervical axillary inguinal adenopathy  Lab Results  Component Value Date   WBC 10.0 02/09/2016   HGB 13.8 02/09/2016   HCT 40.7 02/09/2016   PLT 468.0 (H) 02/09/2016   GLUCOSE 122 (H) 02/09/2016   CHOL 228 (H) 02/09/2016   TRIG 219.0 (H) 02/09/2016   HDL 34.00 (L) 02/09/2016   LDLDIRECT 145.0 02/09/2016   LDLCALC 148 (H) 09/23/2014    ALT 14 02/09/2016   AST 15 02/09/2016   NA 137 02/09/2016   K 3.9 02/09/2016   CL 103 02/09/2016   CREATININE 0.99 02/09/2016   BUN 11 02/09/2016   CO2 24 02/09/2016   TSH 2.21 02/09/2016   HGBA1C 5.7 02/18/2016   BP Readings from Last 3 Encounters:  02/18/16 126/82  02/10/16 124/86  01/10/16 138/87   Wt Readings from Last 3 Encounters:  02/18/16 245 lb (111.1 kg)  02/10/16 245 lb (111.1 kg)  07/30/15 247 lb 9.6 oz (112.3 kg)    ASSESSMENT AND PLAN:  Discussed the following assessment and plan:  Visit for preventive health examination  Hyperlipidemia, unspecified hyperlipidemia type  Low HDL (under 40)  Hyperglycemia - Plan: POCT A1C  Thrombocytosis ([place])  Need for Tdap vaccination - Plan: Tdap vaccine greater than or equal to 7yo IM  BMI [id number] Her metabolic profile is considered reminiscent of insulin resistance prediabetic state. Fortunately her A1c is in good range. She is going to take lifestyle measures and we will follow her up in 4 months. Healthy weight loss and activity will help prevent diabetes may help her headaches number of other medical problems. Her lipid panel is unfavorable and will follow that. Can consider adding medications but prefer lifestyle. We discussed metformin at this point will not implement. The rapid heart rate episodes appear to be getting less and her nonintensive no associated symptoms. At this time we'll follow things aggravate her get associated symptoms will reevaluate further.  reviewed preventive parameters.  Patient Care Team: Burnis Medin, MD as PCP - General Fay Records, MD (Cardiology) Renato Shin, MD as Consulting Physician (Endocrinology) Nunzio Cobbs, MD as Consulting Physician (Obstetrics and Gynecology) Pieter Partridge, DO as Consulting Physician (Neurology) Orie Rout, MD as Referring Physician (Specialist)  Patient Instructions  Continue weight watchers   Healthy weight  loss should help  all parameters   YOu have pre diabetic state . consideration in future or metformin but not needed today  I suspect the  Rapid heart rate witll subside over time and not dangerous  art this time.  ROV  In 4 months    Can check weight .  bp and a1c .        Standley Brooking. Panosh M.D.

## 2016-02-18 ENCOUNTER — Encounter: Payer: Self-pay | Admitting: Internal Medicine

## 2016-02-18 ENCOUNTER — Ambulatory Visit (INDEPENDENT_AMBULATORY_CARE_PROVIDER_SITE_OTHER): Payer: BLUE CROSS/BLUE SHIELD | Admitting: Internal Medicine

## 2016-02-18 VITALS — BP 126/82 | Temp 97.9°F | Ht 65.75 in | Wt 245.0 lb

## 2016-02-18 DIAGNOSIS — D473 Essential (hemorrhagic) thrombocythemia: Secondary | ICD-10-CM

## 2016-02-18 DIAGNOSIS — E785 Hyperlipidemia, unspecified: Secondary | ICD-10-CM

## 2016-02-18 DIAGNOSIS — Z6839 Body mass index (BMI) 39.0-39.9, adult: Secondary | ICD-10-CM | POA: Diagnosis not present

## 2016-02-18 DIAGNOSIS — R739 Hyperglycemia, unspecified: Secondary | ICD-10-CM

## 2016-02-18 DIAGNOSIS — D75839 Thrombocytosis, unspecified: Secondary | ICD-10-CM

## 2016-02-18 DIAGNOSIS — E786 Lipoprotein deficiency: Secondary | ICD-10-CM

## 2016-02-18 DIAGNOSIS — Z23 Encounter for immunization: Secondary | ICD-10-CM

## 2016-02-18 DIAGNOSIS — Z0000 Encounter for general adult medical examination without abnormal findings: Secondary | ICD-10-CM

## 2016-02-18 LAB — POCT GLYCOSYLATED HEMOGLOBIN (HGB A1C): Hemoglobin A1C: 5.7

## 2016-02-18 MED ORDER — PANTOPRAZOLE SODIUM 40 MG PO TBEC: 40.0000 mg | DELAYED_RELEASE_TABLET | Freq: Every day | ORAL | 1 refills | Status: DC

## 2016-02-18 MED ORDER — TRIAMTERENE-HCTZ 37.5-25 MG PO TABS: 1.0000 | ORAL_TABLET | Freq: Every day | ORAL | 3 refills | Status: DC

## 2016-02-18 MED ORDER — VENLAFAXINE HCL ER 150 MG PO CP24: 150.0000 mg | ORAL_CAPSULE | Freq: Every day | ORAL | 3 refills | Status: DC

## 2016-02-18 NOTE — Patient Instructions (Signed)
Continue weight watchers   Healthy weight  loss should help all parameters   YOu have pre diabetic state . consideration in future or metformin but not needed today  I suspect the  Rapid heart rate witll subside over time and not dangerous  art this time.  ROV  In 4 months    Can check weight .  bp and a1c .

## 2016-04-07 ENCOUNTER — Telehealth: Payer: Self-pay | Admitting: Obstetrics and Gynecology

## 2016-04-07 NOTE — Telephone Encounter (Signed)
Patient has some questions about coordinating a recommended medication with her neurologist. She also said she is continuing to have trouble with "heavy periods and cramping."  Last seen: 07/30/15

## 2016-04-07 NOTE — Telephone Encounter (Signed)
I agree with office visit and your recommendations.

## 2016-04-07 NOTE — Telephone Encounter (Signed)
Spoke with patient. Patient states she had new IUD placed 05/2015, has had IUD before. Patient states she had an ovarian cyst about 2 weeks ago, still bleeding. Patient states she has had cyst and a left ovary torsion in past, knows what this is and how it feels. Patient states cycles have began to get heavy again with cramping during cycles. Patient reports nausea and lower back pain. Patient states she always has lower back pain. Patient reports taking 800mg  of motrin and phenergan. Patient reports clots with bleeding and changing regular tampon q3 hours. Patient states she has changed neurologist and would like to know which medication she was advised not to take at last visit d/t migraines? Advised patient per Dr. Quincy Simmonds at last [place] dated 07/30/15 -Not a good candidate for estrogen supplementation for menstrual migraines. Discussed risk of stroke with estrogen use. Recommended OV for evaluation, patient declined OV for this week. Patient states she would prefer to wait until cycle has finished. Patient scheduled for 04/12/16 at 1pm with Dr. Quincy Simmonds. Advised patient should pain worsen, fever, nausea or vomiting devleop, bleeding increase to changing tampon q1-2 hours -return call to office or if after hours, seek care at local ER. Advised patient will review with Dr. Quincy Simmonds and return call with any additional recommendations, patient is agreeable.   Dr. Quincy Simmonds, any additional recommendations?

## 2016-04-12 ENCOUNTER — Encounter: Payer: Self-pay | Admitting: Obstetrics and Gynecology

## 2016-04-12 ENCOUNTER — Telehealth: Payer: Self-pay | Admitting: Obstetrics and Gynecology

## 2016-04-12 ENCOUNTER — Ambulatory Visit: Payer: Self-pay | Admitting: Obstetrics and Gynecology

## 2016-04-12 NOTE — Telephone Encounter (Signed)
Patient cancelled problem visit today for bleeding and wanted to reschedule a few weeks out. She is aware of dnka fee for today.

## 2016-04-12 NOTE — Telephone Encounter (Signed)
Spoke with patient. Patient states she is still bleeding and does not want to come in until bleeding stops. Patient states vaginal bleeding is "tapering off". Patient states she can not be seen until 3/28 or 3/29. Patient states this is not a new problem, just need to discuss "adding some progesterone". Patient rescheduled for 3/29 at 1pm. Advised patient should bleeding increase to changing pad q1-2 hours, fatigue, weakness or dizziness develop, return call to office for scheduling earlier appointment or seek care at ER after hours. Patient verbalizes understanding and is agreeable.  Routing to provider for final review. Patient is agreeable to disposition. Will close encounter.

## 2016-04-22 ENCOUNTER — Encounter: Payer: Self-pay | Admitting: Obstetrics and Gynecology

## 2016-04-22 ENCOUNTER — Ambulatory Visit (INDEPENDENT_AMBULATORY_CARE_PROVIDER_SITE_OTHER): Payer: BLUE CROSS/BLUE SHIELD | Admitting: Obstetrics and Gynecology

## 2016-04-22 VITALS — BP 112/62 | HR 64 | Resp 16 | Ht 66.0 in | Wt 236.0 lb

## 2016-04-22 DIAGNOSIS — Z1231 Encounter for screening mammogram for malignant neoplasm of breast: Secondary | ICD-10-CM | POA: Diagnosis not present

## 2016-04-22 DIAGNOSIS — G8929 Other chronic pain: Secondary | ICD-10-CM | POA: Diagnosis not present

## 2016-04-22 DIAGNOSIS — D259 Leiomyoma of uterus, unspecified: Secondary | ICD-10-CM

## 2016-04-22 DIAGNOSIS — R1031 Right lower quadrant pain: Secondary | ICD-10-CM

## 2016-04-22 DIAGNOSIS — N921 Excessive and frequent menstruation with irregular cycle: Secondary | ICD-10-CM

## 2016-04-22 MED ORDER — NORETHINDRONE 0.35 MG PO TABS: 1.0000 | ORAL_TABLET | Freq: Every day | ORAL | 0 refills | Status: DC

## 2016-04-22 NOTE — Progress Notes (Signed)
Scheduled patient while in office for screening mammogram at the [facility] on 04/23/2016 at 1:30 pm. Patient is agreeable to date and time.

## 2016-04-22 NOTE — Progress Notes (Signed)
GYNECOLOGY  VISIT   HPI: 46 y.o.   Single  African American  female   [phone] with No LMP recorded. Patient is not currently having periods (Reason: IUD).   here for bleeding with IUD. Patient states after Mirena insertion 05/2015 she has irregular bleeding for 4-5 months, then no cycles at all for a few months and recently had a very heavy cycle. She is also experiencing upper and lower abdominal pain daily.    Last menses lasted 9 days.  Extremely heavy with clots. Changing a regular tampon every 3 hours.  Stained through clothing at night.  Feels like she was doing well up with Mirena until this last cycle. Previously did course of Depo Lupron.   Has right sided lower abdominal pain during her menses and every once in a while.  Also has right upper quadrant most consistently.   Had 2 known fibroids.  Normal right ovary at last ultrasound May 2017.   Normal HIDA scan 2016.  Abdominal ultrasound 02/11/14 - fatty liver, otherwise normal.   This menses triggered a migraine.  Sees Dr. Domingo Cocking.  Taking Zonisimide, trigger point injections every 6 weeks and Botox every 3 months.  Per patient she does not have hemiplegic migraines.   GYNECOLOGIC HISTORY: No LMP recorded. Patient is not currently having periods (Reason: IUD). Contraception:  Mirena IUD inserted 06-19-15 Menopausal hormone therapy:  n/a Last mammogram:  12-03-13 Density B/Neg/BiRads1:TBC Last pap smear: 09-10-13 Neg:Neg HR HPV; 08-09-10 Neg        OB History    Gravida Para Term Preterm AB Living   7 2     5 2    SAB TAB Ectopic Multiple Live Births   1 1 3           Obstetric Comments   3 ectopic         Patient Active Problem List   Diagnosis Date Noted  . Morbid obesity ([place]) 01/30/2015  . 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 RUQ pain 02/25/2014  . Diarrhea 02/25/2014  . Calcium oxalate crystals in urine 02/22/2014  . Intramural leiomyoma of uterus 02/22/2014  . Hx of  migraine headaches 02/04/2014  . Medication management 02/04/2014  . Hx of endometriosis 02/04/2014  . Right lower quadrant abdominal pain mid also 02/04/2014  . Adjustment disorder with anxious mood 01/17/2014  . Thyroid mass 02/16/2013  . Fever, unspecified 02/15/2013  . Sinusitis 02/15/2013  . Anxiety 12/11/2011  . Stress 12/11/2011  . Irregular periods 08/17/2011  . Endometriosis 08/17/2011  . Edema 05/10/2011  . Elevated blood pressure reading 05/10/2011  . Fatigue 04/08/2011  . Protracted URI 04/08/2011  . SINUSITIS, RECURRENT 11/06/2009  . FATIGUE 02/25/2009  . PALPITATIONS 02/25/2009  . DYSPNEA 02/25/2009  . KNEE INJURY, RIGHT 12/05/2007  . HEADACHE 05/03/2007  . ADULT SITUATIONAL REACTION 11/21/2006  . SLEEPLESSNESS 11/21/2006  . CHEST PAIN 11/21/2006  . TINEA PEDIS 08/31/2006  . Other and unspecified hyperlipidemia 08/31/2006  . DISORDER, ADJUSTMENT W/ANXIETY 08/31/2006  . PLANTAR FASCIITIS 08/31/2006  . ANEMIA-NOS 06/23/2006  . MIGRAINE, COMMON 06/23/2006  . ALLERGIC RHINITIS 06/23/2006  . GERD 06/23/2006  . IRRITABLE BOWEL SYNDROME 06/23/2006    Past Medical History:  Diagnosis Date  . Allergic rhinitis    hx of testing and allergic to spring and fall f= pollens  . Anemia    nos  . Anxiety   . Chest pain    neg stress test summer 2010  . Endometriosis   . Endometriosis   .  Fibroids   . GERD (gastroesophageal reflux disease)   . Goiter   . Headache(784.0)   . Hyperlipidemia   . Hypertension   . Irritable bowel syndrome   . Labial cyst 04/08/2011   early infection use hotpcompresses    . Migraines   . Ovarian torsion   . Sinusitis   . STD (sexually transmitted disease)    Pos. Chlamydia--in her 33's  . Thyroid disease    goiter    Past Surgical History:  Procedure Laterality Date  . CESAREAN SECTION  2002, 2004   x2  . PELVIC LAPAROSCOPY     x9  . RADIOLOGY WITH ANESTHESIA N/A 09/05/2014   Procedure: MRI OF BRAIN WITHOUT CONTRAST;   Surgeon: Medication Radiologist, MD;  Location: [facility];  Service: Radiology;  Laterality: N/A;  MRI/DR. JAFFE  . TUBAL LIGATION    . UNILATERAL SALPINGECTOMY     right  age 46 ectopics.    Current Outpatient Prescriptions  Medication Sig Dispense Refill  . baclofen (LIORESAL) 10 MG tablet take ONE-HALF to 1 TAB AS NEEDED to TWICE DAILY, headache. Limit to 2 headache days per week. Avoid daily use.  0  . chlorproMAZINE (THORAZINE) 25 MG tablet take 1-2 TABLET BY MOUTH AS NEEDED for headache to TWICE DAILY and avoid daily use. limit use to 1-2 days per week  0  . Multiple Vitamin (MULTIVITAMIN WITH MINERALS) TABS tablet Take 1 tablet by mouth daily.    . pantoprazole (PROTONIX) 40 MG tablet Take 1 tablet (40 mg total) by mouth daily. 90 tablet 1  . PRESCRIPTION MEDICATION Botox injections every 3 months for migraines    . promethazine (PHENERGAN) 25 MG tablet Take 1 tablet (25 mg total) by mouth every 6 (six) hours as needed for nausea or vomiting. 60 tablet 1  . triamterene-hydrochlorothiazide (MAXZIDE-25) 37.5-25 MG tablet Take 1 tablet by mouth daily. 90 tablet 3  . UNABLE TO FIND Med Name: Trigger injections of steriods every six weeks for migraines    . venlafaxine XR (EFFEXOR-XR) 150 MG 24 hr capsule Take 1 capsule (150 mg total) by mouth daily with breakfast. 90 capsule 3  . zonisamide (ZONEGRAN) 100 MG capsule Take 1 capsule by mouth at bedtime.  1   No current facility-administered medications for this visit.      ALLERGIES: Codeine phosphate; Moxifloxacin; and Sulfamethoxazole  Family History  Problem Relation Age of Onset  . Anxiety disorder Mother     mother is on xanax and apparently applying for  social security diabilty for her anxiety. t  . Depression Mother   . Diabetes Mother   . Hypertension Mother   . Heart attack Father   . Seizures Father   . Stroke Maternal Grandmother   . Diabetes Paternal Grandmother   . Hypertension Paternal Grandmother   . Coronary artery  disease    . Diabetes    . Hyperlipidemia    . Other      history of child death in family   . Colon cancer Neg Hx   . Stomach cancer Neg Hx   . Esophageal cancer Neg Hx     Social History   Social History  . Marital status: Single    Spouse name: N/A  . Number of children: N/A  . Years of education: N/A   Occupational History  . Not on file.   Social History Main Topics  . Smoking status: Never Smoker  . Smokeless tobacco: Never Used  . Alcohol use No  .  Drug use: No  . Sexual activity: Yes    Partners: Male    Birth control/ protection: Surgical     Comment: Tubal/Mirena IUD inserted 06-19-15   Other Topics Concern  . 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   And graduated as SW.      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  PHYSICAL EXAMINATION:    BP 112/62 (BP Location: Right Arm, Patient Position: Sitting, Cuff Size: Large)   Pulse 64   Resp 16   Ht 5\' 6"  (1.676 m)   Wt 236 lb (107 kg)   BMI 38.09 kg/m     General appearance: alert, cooperative and appears stated age  Pelvic: External genitalia:  no lesions              Urethra:  normal appearing urethra with no masses, tenderness or lesions              Bartholins and Skenes: normal                 Vagina: normal appearing vagina with normal color and discharge, no lesions              Cervix: no lesions.  IUD strings seen.                 Bimanual Exam:  Uterus:  normal size, contour, position,  consistency, mobility, non-tender              Adnexa: no mass, fullness, tenderness          Chaperone was present for exam.  ASSESSMENT  Chronic pelvic pain.  Fibroids. HTN.  Migraines.   Mirena IUD.  Status post Depo Lupron.  Status post "BTL".  Status post multiple abdominal surgeries including ovarian torsion and left salpingo-oophorectomy in 2010 and right salpingectomy in 2004.  PLAN  Discussed chronic pelvic pain and options for care:  Observation, Depo Provera, course of Micronor or Aygestin, and surgical care including laparoscopic hysterectomy.  Will add Micronor to her regimen.  3 packs.  RF none.  Ok to start now. Follow up for annual well woman visit and recheck in 2 months. Schedule mammogram.   An After Visit Summary was printed and given to the patient.  ___25___ minutes face to face time of which over 50% was spent in counseling.

## 2016-04-23 ENCOUNTER — Ambulatory Visit
Admission: RE | Admit: 2016-04-23 | Discharge: 2016-04-23 | Disposition: A | Discharge status: Home / Self Care | Payer: BLUE CROSS/BLUE SHIELD | Source: Ambulatory Visit | Attending: Obstetrics and Gynecology | Admitting: Obstetrics and Gynecology

## 2016-04-23 DIAGNOSIS — Z1231 Encounter for screening mammogram for malignant neoplasm of breast: Secondary | ICD-10-CM

## 2016-04-26 ENCOUNTER — Ambulatory Visit (INDEPENDENT_AMBULATORY_CARE_PROVIDER_SITE_OTHER): Payer: BLUE CROSS/BLUE SHIELD | Admitting: Internal Medicine

## 2016-04-26 ENCOUNTER — Encounter: Payer: Self-pay | Admitting: Internal Medicine

## 2016-04-26 VITALS — BP 102/60 | HR 110 | Temp 98.8°F | Ht 66.0 in | Wt 233.2 lb

## 2016-04-26 DIAGNOSIS — J0100 Acute maxillary sinusitis, unspecified: Secondary | ICD-10-CM | POA: Diagnosis not present

## 2016-04-26 DIAGNOSIS — J320 Chronic maxillary sinusitis: Secondary | ICD-10-CM

## 2016-04-26 DIAGNOSIS — J040 Acute laryngitis: Secondary | ICD-10-CM

## 2016-04-26 MED ORDER — AMOXICILLIN-POT CLAVULANATE 875-125 MG PO TABS: 1.0000 | ORAL_TABLET | Freq: Two times a day (BID) | ORAL | 0 refills | Status: DC

## 2016-04-26 NOTE — Patient Instructions (Signed)
I believe you had a flulike illness with a secondary right maxillary sinus infection. Continue fluids warm compresses nasal saline add nasal cortisone either Flonase or Nasacort 2 sprays each side of the nose once a day to decrease sinus congestion. Begin antibiotic for sinusitis. Relative voice rest over the next 2 days and return to work in 2 days  As better  Continue fluids cough suppression for comfort.  Contact us if relapsing fever sweats or shortness of breath.  Sinusitis, Adult Sinusitis is soreness and inflammation of your sinuses. Sinuses are hollow spaces in the bones around your face. Your sinuses are located:  Around your eyes.  In the middle of your forehead.  Behind your nose.  In your cheekbones. Your sinuses and nasal passages are lined with a stringy fluid (mucus). Mucus normally drains out of your sinuses. When your nasal tissues become inflamed or swollen, the mucus can become trapped or blocked so air cannot flow through your sinuses. This allows bacteria, viruses, and funguses to grow, which leads to infection. Sinusitis can develop quickly and last for 7?10 days (acute) or for more than 12 weeks (chronic). Sinusitis often develops after a cold. What are the causes? This condition is caused by anything that creates swelling in the sinuses or stops mucus from draining, including:  Allergies.  Asthma.  Bacterial or viral infection.  Abnormally shaped bones between the nasal passages.  Nasal growths that contain mucus (nasal polyps).  Narrow sinus openings.  Pollutants, such as chemicals or irritants in the air.  A foreign object stuck in the nose.  A fungal infection. This is rare. What increases the risk? The following factors may make you more likely to develop this condition:  Having allergies or asthma.  Having had a recent cold or respiratory tract infection.  Having structural deformities or blockages in your nose or sinuses.  Having a weak  immune system.  Doing a lot of swimming or diving.  Overusing nasal sprays.  Smoking. What are the signs or symptoms? The main symptoms of this condition are pain and a feeling of pressure around the affected sinuses. Other symptoms include:  Upper toothache.  Earache.  Headache.  Bad breath.  Decreased sense of smell and taste.  A cough that may get worse at night.  Fatigue.  Fever.  Thick drainage from your nose. The drainage is often green and it may contain pus (purulent).  Stuffy nose or congestion.  Postnasal drip. This is when extra mucus collects in the throat or back of the nose.  Swelling and warmth over the affected sinuses.  Sore throat.  Sensitivity to light. How is this diagnosed? This condition is diagnosed based on symptoms, a medical history, and a physical exam. To find out if your condition is acute or chronic, your health care provider may:  Look in your nose for signs of nasal polyps.  Tap over the affected sinus to check for signs of infection.  View the inside of your sinuses using an imaging device that has a light attached (endoscope). If your health care provider suspects that you have chronic sinusitis, you may also:  Be tested for allergies.  Have a sample of mucus taken from your nose (nasal culture) and checked for bacteria.  Have a mucus sample examined to see if your sinusitis is related to an allergy. If your sinusitis does not respond to treatment and it lasts longer than 8 weeks, you may have an MRI or CT scan to check your sinuses.  These scans also help to determine how severe your infection is. In rare cases, a bone biopsy may be done to rule out more serious types of fungal sinus disease. How is this treated? Treatment for sinusitis depends on the cause and whether your condition is chronic or acute. If a virus is causing your sinusitis, your symptoms will go away on their own within 10 days. You may be given medicines to  relieve your symptoms, including:  Topical nasal decongestants. They shrink swollen nasal passages and let mucus drain from your sinuses.  Antihistamines. These drugs block inflammation that is triggered by allergies. This can help to ease swelling in your nose and sinuses.  Topical nasal corticosteroids. These are nasal sprays that ease inflammation and swelling in your nose and sinuses.  Nasal saline washes. These rinses can help to get rid of thick mucus in your nose. If your condition is caused by bacteria, you will be given an antibiotic medicine. If your condition is caused by a fungus, you will be given an antifungal medicine. Surgery may be needed to correct underlying conditions, such as narrow nasal passages. Surgery may also be needed to remove polyps. Follow these instructions at home: Medicines   Take, use, or apply over-the-counter and prescription medicines only as told by your health care provider. These may include nasal sprays.  If you were prescribed an antibiotic medicine, take it as told by your health care provider. Do not stop taking the antibiotic even if you start to feel better. Hydrate and Humidify   Drink enough water to keep your urine clear or pale yellow. Staying hydrated will help to thin your mucus.  Use a cool mist humidifier to keep the humidity level in your home above 50%.  Inhale steam for 10-15 minutes, 3-4 times a day or as told by your health care provider. You can do this in the bathroom while a hot shower is running.  Limit your exposure to cool or dry air. Rest   Rest as much as possible.  Sleep with your head raised (elevated).  Make sure to get enough sleep each night. General instructions   Apply a warm, moist washcloth to your face 3-4 times a day or as told by your health care provider. This will help with discomfort.  Wash your hands often with soap and water to reduce your exposure to viruses and other germs. If soap and water  are not available, use hand sanitizer.  Do not smoke. Avoid being around people who are smoking (secondhand smoke).  Keep all follow-up visits as told by your health care provider. This is important. Contact a health care provider if:  You have a fever.  Your symptoms get worse.  Your symptoms do not improve within 10 days. Get help right away if:  You have a severe headache.  You have persistent vomiting.  You have pain or swelling around your face or eyes.  You have vision problems.  You develop confusion.  Your neck is stiff.  You have trouble breathing. This information is not intended to replace advice given to you by your health care provider. Make sure you discuss any questions you have with your health care provider. Document Released: 01/11/2005 Document Revised: 09/07/2015 Document Reviewed: 11/06/2014 Elsevier Interactive Patient Education  2017 [employer]     Laryngitis Laryngitis is inflammation of your vocal cords. This causes hoarseness, coughing, loss of voice, sore throat, or a dry throat. Your vocal cords are two bands of muscles  that are found in your throat. When you speak, these cords come together and vibrate. These vibrations come out through your mouth as sound. When your vocal cords are inflamed, your voice sounds different. Laryngitis can be temporary (acute) or long-term (chronic). Most cases of acute laryngitis improve with time. Chronic laryngitis is laryngitis that lasts for more than three weeks. What are the causes? Acute laryngitis may be caused by:  A viral infection.  Lots of talking, yelling, or singing. This is also called vocal strain.  Bacterial infections. Chronic laryngitis may be caused by:  Vocal strain.  Injury to your vocal cords.  Acid reflux (gastroesophageal reflux disease or GERD).  Allergies.  Sinus infection.  Smoking.  Alcohol abuse.  Breathing in chemicals or dust.  Growths on the vocal  cords. What increases the risk? Risk factors for laryngitis include:  Smoking.  Alcohol abuse.  Having allergies. What are the signs or symptoms? Symptoms of laryngitis may include:  Low, hoarse voice.  Loss of voice.  Dry cough.  Sore throat.  Stuffy nose. How is this diagnosed? Laryngitis may be diagnosed by:  Physical exam.  Throat culture.  Blood test.  Laryngoscopy. This procedure allows your health care provider to look at your vocal cords with a mirror or viewing tube. How is this treated? Treatment for laryngitis depends on what is causing it. Usually, treatment involves resting your voice and using medicines to soothe your throat. However, if your laryngitis is caused by a bacterial infection, you may need to take antibiotic medicine. If your laryngitis is caused by a growth, you may need to have a procedure to remove it. Follow these instructions at home:  Drink enough fluid to keep your urine clear or pale yellow.  Breathe in moist air. Use a humidifier if you live in a dry climate.  Take medicines only as directed by your health care provider.  If you were prescribed an antibiotic medicine, finish it all even if you start to feel better.  Do not smoke cigarettes or electronic cigarettes. If you need help quitting, ask your health care provider.  Talk as little as possible. Also avoid whispering, which can cause vocal strain.  Write instead of talking. Do this until your voice is back to normal. Contact a health care provider if:  You have a fever.  You have increasing pain.  You have difficulty swallowing. Get help right away if:  You cough up blood.  You have trouble breathing. This information is not intended to replace advice given to you by your health care provider. Make sure you discuss any questions you have with your health care provider. Document Released: 01/11/2005 Document Revised: 06/19/2015 Document Reviewed: 06/26/2013 Elsevier  Interactive Patient Education  2017 [employer].

## 2016-04-26 NOTE — Progress Notes (Signed)
Chief Complaint  Patient presents with  . Nasal Congestion    right side face hurt/ night sweats  . Chest Pain    started thursday/   . Cough    right ear hurts   . Fever    100.6     HPI: [name] 46 y.o.  sda  Onset 5 days ago of st cough fever 100.6 and setas  Not today  And 3 days of r face pain ear pain  And throar pain .  Very hoarse   No resp distress has 2 jobs one with exposures ot uri sx  .  Had flu vaccine this year  Right ear pain    And right face  Pain  Saturday .   2 day [name] go  Has a job needs to voice  Sore throat on the right   ROS: See pertinent positives and negatives per HPI. No cps ob hemotypsis   Past Medical History:  Diagnosis Date  . Allergic rhinitis    hx of testing and allergic to spring and fall f= pollens  . Anemia    nos  . Anxiety   . Chest pain    neg stress test summer 2010  . Endometriosis   . Endometriosis   . Fibroids   . GERD (gastroesophageal reflux disease)   . Goiter   . Headache(784.0)   . Hyperlipidemia   . Hypertension   . Irritable bowel syndrome   . Labial cyst 04/08/2011   early infection use hotpcompresses    . Migraines   . Ovarian torsion   . Sinusitis   . STD (sexually transmitted disease)    Pos. Chlamydia--in her 52's  . Thyroid disease    goiter    Family History  Problem Relation Age of Onset  . Anxiety disorder Mother     mother is on xanax and apparently applying for  social security diabilty for her anxiety. t  . Depression Mother   . Diabetes Mother   . Hypertension Mother   . Heart attack Father   . Seizures Father   . Stroke Maternal Grandmother   . Diabetes Paternal Grandmother   . Hypertension Paternal Grandmother   . Coronary artery disease    . Diabetes    . Hyperlipidemia    . Other      history of child death in family   . Colon cancer Neg Hx   . Stomach cancer Neg Hx   . Esophageal cancer Neg Hx     Social History   Social History  . Marital status: Single   Spouse name: N/A  . Number of children: N/A  . Years of education: N/A   Social History Main Topics  . Smoking status: Never Smoker  . Smokeless tobacco: Never Used  . Alcohol use No  . Drug use: No  . Sexual activity: Yes    Partners: Male    Birth control/ protection: Surgical     Comment: Tubal/Mirena IUD inserted 06-19-15   Other Topics Concern  . 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   And  graduated as SW.      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 baclofen (LIORESAL) 10 MG tablet take ONE-HALF to 1 TAB AS NEEDED to TWICE DAILY, headache. Limit to 2 headache days per week. Avoid daily use.  0  . chlorproMAZINE (THORAZINE) 25 MG tablet take 1-2 TABLET BY MOUTH AS NEEDED for headache to TWICE DAILY and avoid daily use. limit use to 1-2 days per week  0  . Multiple Vitamin (MULTIVITAMIN WITH MINERALS) TABS tablet Take 1 tablet by mouth daily.    . norethindrone (MICRONOR,CAMILA,ERRIN) 0.35 MG tablet Take 1 tablet (0.35 mg total) by mouth daily. 3 Package 0  . pantoprazole (PROTONIX) 40 MG tablet Take 1 tablet (40 mg total) by mouth daily. 90 tablet 1  . PRESCRIPTION MEDICATION Botox injections every 3 months for migraines    . promethazine (PHENERGAN) 25 MG tablet Take 1 tablet (25 mg total) by mouth every 6 (six) hours as needed for nausea or vomiting. 60 tablet 1  . triamterene-hydrochlorothiazide (MAXZIDE-25) 37.5-25 MG tablet Take 1 tablet by  mouth daily. 90 tablet 3  . UNABLE TO FIND Med Name: Trigger injections of steriods every six weeks for migraines    . venlafaxine XR (EFFEXOR-XR) 150 MG 24 hr capsule Take 1 capsule (150 mg total) by mouth daily with breakfast. 90 capsule 3  . zonisamide (ZONEGRAN) 100 MG capsule Take 1 capsule by mouth at bedtime.  1   No facility-administered medications prior to visit.      EXAM:  BP 102/60 (BP Location: Right Arm, Patient Position: Sitting, Cuff Size: Normal)   Pulse (!) 110   Temp 98.8 F (37.1 C) (Oral)   Ht 5\' 6"  (1.676 m)   Wt 233 lb 3.2 oz (105.8 kg)   SpO2 98%   BMI 37.64 kg/m   Body mass index is 37.64 kg/m.  WDWN in NAD  quiet respirations; mildly congested  somewhat hoarse. Non toxic . HEENT: Normocephalic ;atraumatic , Eyes;  PERRL, EOMs  Full, lids and conjunctiva clear,,Ears: no deformities, canals nl, TM landmarks normal, Nose: no deformity or discharge but congested;face right max tender Mouth : OP clear without lesion or edema . 1 + red  Neck: Supple without adenopathy or masses or bruits tender ac area  Right  Chest:  Clear to A&P without wheezes rales or rhonchi CV:  S1-S2 no gallops or murmurs peripheral perfusion is normal Skin :nl perfusion and no acute rashes   ASSESSMENT AND PLAN:  Discussed the following assessment and plan:  Right maxillary sinusitis  Laryngitis, acute - poss flu like illness based on hx and context    Expectant management. Note for work  -Patient advised to return or notify health care team  if symptoms worsen ,persist or new concerns arise.  Patient Instructions  I believe you had a flulike illness with a secondary right maxillary sinus infection. Continue fluids warm compresses nasal saline add nasal cortisone either Flonase or Nasacort 2 sprays each side of the nose once a day to decrease sinus congestion. Begin antibiotic for sinusitis. Relative voice rest over the next 2 days and return to work in 2 days  As better    Continue fluids cough suppression for comfort.  Contact us if relapsing fever sweats or shortness of breath.  Sinusitis, Adult Sinusitis is soreness and inflammation of your sinuses. Sinuses are hollow spaces in the bones around your face. Your sinuses are located:  Around your eyes.  In the middle of your forehead.  Behind your nose.  In your cheekbones. Your sinuses and nasal passages are lined with a stringy fluid (mucus). Mucus normally drains out of your sinuses. When your nasal tissues become inflamed or swollen, the mucus can become trapped or blocked so air cannot flow through your sinuses. This allows bacteria, viruses, and funguses to grow, which leads to infection. Sinusitis can develop quickly and last for 7?10 days (acute) or for more than 12 weeks (chronic). Sinusitis often develops after a cold. What are the causes? This condition is caused by anything that creates swelling in the sinuses or stops mucus from draining, including:  Allergies.  Asthma.  Bacterial or viral infection.  Abnormally shaped bones between the nasal passages.  Nasal growths that contain mucus (nasal polyps).  Narrow sinus openings.  Pollutants, such as chemicals or irritants in the air.  A foreign object stuck in the nose.  A fungal infection. This is rare. What increases the risk? The following factors may make you more likely to develop this condition:  Having allergies or asthma.  Having had a recent cold or respiratory tract infection.  Having structural deformities or blockages in your nose or sinuses.  Having a weak immune system.  Doing a lot of swimming or diving.  Overusing nasal sprays.  Smoking. What are the signs or symptoms? The main symptoms of this condition are pain and a feeling of pressure around the affected sinuses. Other symptoms include:  Upper toothache.  Earache.  Headache.  Bad breath.  Decreased sense of smell and taste.  A cough that  may get worse at night.  Fatigue.  Fever.  Thick drainage from your nose. The drainage is often green and it may contain pus (purulent).  Stuffy nose or congestion.  Postnasal drip. This is when extra mucus collects in the throat or back of the nose.  Swelling and warmth over the affected sinuses.  Sore throat.  Sensitivity to light. How is this diagnosed? This condition is diagnosed based on symptoms, a medical history, and a physical exam. To find out if your condition is acute or chronic, your health care provider may:  Look in your nose for signs of nasal polyps.  Tap over the affected sinus to check for signs of infection.  View the inside of your sinuses using an imaging device that has a light attached (endoscope). If your health care provider suspects that you have chronic sinusitis, you may also:  Be tested for allergies.  Have a sample of mucus taken from your nose (nasal culture) and checked for bacteria.  Have a mucus sample examined to see if your sinusitis is related to an allergy. If your sinusitis does not respond to treatment and it lasts longer than 8 weeks, you may have an MRI or CT scan to check your sinuses. These scans also help to determine how severe your infection is. In rare cases, a bone biopsy may be done to rule out more serious types of fungal sinus disease. How is this treated? Treatment for sinusitis depends on the cause and whether your condition is chronic or acute. If a virus is causing your sinusitis, your symptoms will  go away on their own within 10 days. You may be given medicines to relieve your symptoms, including:  Topical nasal decongestants. They shrink swollen nasal passages and let mucus drain from your sinuses.  Antihistamines. These drugs block inflammation that is triggered by allergies. This can help to ease swelling in your nose and sinuses.  Topical nasal corticosteroids. These are nasal sprays that ease inflammation and  swelling in your nose and sinuses.  Nasal saline washes. These rinses can help to get rid of thick mucus in your nose. If your condition is caused by bacteria, you will be given an antibiotic medicine. If your condition is caused by a fungus, you will be given an antifungal medicine. Surgery may be needed to correct underlying conditions, such as narrow nasal passages. Surgery may also be needed to remove polyps. Follow these instructions at home: Medicines   Take, use, or apply over-the-counter and prescription medicines only as told by your health care provider. These may include nasal sprays.  If you were prescribed an antibiotic medicine, take it as told by your health care provider. Do not stop taking the antibiotic even if you start to feel better. Hydrate and Humidify   Drink enough water to keep your urine clear or pale yellow. Staying hydrated will help to thin your mucus.  Use a cool mist humidifier to keep the humidity level in your home above 50%.  Inhale steam for 10-15 minutes, 3-4 times a day or as told by your health care provider. You can do this in the bathroom while a hot shower is running.  Limit your exposure to cool or dry air. Rest   Rest as much as possible.  Sleep with your head raised (elevated).  Make sure to get enough sleep each night. General instructions   Apply a warm, moist washcloth to your face 3-4 times a day or as told by your health care provider. This will help with discomfort.  Wash your hands often with soap and water to reduce your exposure to viruses and other germs. If soap and water are not available, use hand sanitizer.  Do not smoke. Avoid being around people who are smoking (secondhand smoke).  Keep all follow-up visits as told by your health care provider. This is important. Contact a health care provider if:  You have a fever.  Your symptoms get worse.  Your symptoms do not improve within 10 days. Get help right away  if:  You have a severe headache.  You have persistent vomiting.  You have pain or swelling around your face or eyes.  You have vision problems.  You develop confusion.  Your neck is stiff.  You have trouble breathing. This information is not intended to replace advice given to you by your health care provider. Make sure you discuss any questions you have with your health care provider. Document Released: 01/11/2005 Document Revised: 09/07/2015 Document Reviewed: 11/06/2014 Elsevier Interactive Patient Education  2017 Elsevier Inc.     Laryngitis Laryngitis is inflammation of your vocal cords. This causes hoarseness, coughing, loss of voice, sore throat, or a dry throat. Your vocal cords are two bands of muscles that are found in your throat. When you speak, these cords come together and vibrate. These vibrations come out through your mouth as sound. When your vocal cords are inflamed, your voice sounds different. Laryngitis can be temporary (acute) or long-term (chronic). Most cases of acute laryngitis improve with time. Chronic laryngitis is laryngitis that lasts for more than  three weeks. What are the causes? Acute laryngitis may be caused by:  A viral infection.  Lots of talking, yelling, or singing. This is also called vocal strain.  Bacterial infections. Chronic laryngitis may be caused by:  Vocal strain.  Injury to your vocal cords.  Acid reflux (gastroesophageal reflux disease or GERD).  Allergies.  Sinus infection.  Smoking.  Alcohol abuse.  Breathing in chemicals or dust.  Growths on the vocal cords. What increases the risk? Risk factors for laryngitis include:  Smoking.  Alcohol abuse.  Having allergies. What are the signs or symptoms? Symptoms of laryngitis may include:  Low, hoarse voice.  Loss of voice.  Dry cough.  Sore throat.  Stuffy nose. How is this diagnosed? Laryngitis may be diagnosed by:  Physical exam.  Throat  culture.  Blood test.  Laryngoscopy. This procedure allows your health care provider to look at your vocal cords with a mirror or viewing tube. How is this treated? Treatment for laryngitis depends on what is causing it. Usually, treatment involves resting your voice and using medicines to soothe your throat. However, if your laryngitis is caused by a bacterial infection, you may need to take antibiotic medicine. If your laryngitis is caused by a growth, you may need to have a procedure to remove it. Follow these instructions at home:  Drink enough fluid to keep your urine clear or pale yellow.  Breathe in moist air. Use a humidifier if you live in a dry climate.  Take medicines only as directed by your health care provider.  If you were prescribed an antibiotic medicine, finish it all even if you start to feel better.  Do not smoke cigarettes or electronic cigarettes. If you need help quitting, ask your health care provider.  Talk as little as possible. Also avoid whispering, which can cause vocal strain.  Write instead of talking. Do this until your voice is back to normal. Contact a health care provider if:  You have a fever.  You have increasing pain.  You have difficulty swallowing. Get help right away if:  You cough up blood.  You have trouble breathing. This information is not intended to replace advice given to you by your health care provider. Make sure you discuss any questions you have with your health care provider. Document Released: 01/11/2005 Document Revised: 06/19/2015 Document Reviewed: 06/26/2013 Elsevier Interactive Patient Education  2017 Laureles K. Sarra Rachels M.D.

## 2016-04-27 ENCOUNTER — Other Ambulatory Visit: Payer: Self-pay | Admitting: Obstetrics and Gynecology

## 2016-04-27 DIAGNOSIS — R928 Other abnormal and inconclusive findings on diagnostic imaging of breast: Secondary | ICD-10-CM

## 2016-04-30 ENCOUNTER — Ambulatory Visit
Admission: RE | Admit: 2016-04-30 | Discharge: 2016-04-30 | Disposition: A | Discharge status: Home / Self Care | Payer: BLUE CROSS/BLUE SHIELD | Source: Ambulatory Visit | Attending: Obstetrics and Gynecology | Admitting: Obstetrics and Gynecology

## 2016-04-30 ENCOUNTER — Other Ambulatory Visit: Payer: Self-pay | Admitting: Obstetrics and Gynecology

## 2016-04-30 DIAGNOSIS — N6320 Unspecified lump in the left breast, unspecified quadrant: Secondary | ICD-10-CM

## 2016-04-30 DIAGNOSIS — R928 Other abnormal and inconclusive findings on diagnostic imaging of breast: Secondary | ICD-10-CM

## 2016-05-03 ENCOUNTER — Ambulatory Visit
Admission: RE | Admit: 2016-05-03 | Discharge: 2016-05-03 | Disposition: A | Discharge status: Home / Self Care | Payer: BLUE CROSS/BLUE SHIELD | Source: Ambulatory Visit | Attending: Obstetrics and Gynecology | Admitting: Obstetrics and Gynecology

## 2016-05-03 ENCOUNTER — Other Ambulatory Visit: Payer: Self-pay | Admitting: Obstetrics and Gynecology

## 2016-05-03 DIAGNOSIS — N6320 Unspecified lump in the left breast, unspecified quadrant: Secondary | ICD-10-CM

## 2016-06-17 ENCOUNTER — Ambulatory Visit: Payer: BLUE CROSS/BLUE SHIELD | Admitting: Internal Medicine

## 2016-06-23 ENCOUNTER — Encounter: Payer: Self-pay | Admitting: Obstetrics and Gynecology

## 2016-06-23 ENCOUNTER — Ambulatory Visit: Payer: BLUE CROSS/BLUE SHIELD | Admitting: Obstetrics and Gynecology

## 2016-07-12 ENCOUNTER — Ambulatory Visit: Payer: BLUE CROSS/BLUE SHIELD | Admitting: Internal Medicine

## 2016-08-26 ENCOUNTER — Other Ambulatory Visit: Payer: Self-pay | Admitting: Internal Medicine

## 2016-08-26 MED ORDER — PANTOPRAZOLE SODIUM 40 MG PO TBEC: 40.0000 mg | DELAYED_RELEASE_TABLET | Freq: Every day | ORAL | 0 refills | Status: DC

## 2016-10-15 ENCOUNTER — Other Ambulatory Visit: Payer: Self-pay | Admitting: Obstetrics and Gynecology

## 2016-10-15 ENCOUNTER — Encounter: Payer: Self-pay | Admitting: Internal Medicine

## 2016-10-15 DIAGNOSIS — N6320 Unspecified lump in the left breast, unspecified quadrant: Secondary | ICD-10-CM
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
## 2016-10-19 ENCOUNTER — Other Ambulatory Visit (HOSPITAL_COMMUNITY)
Admission: RE | Admit: 2016-10-19 | Discharge: 2016-10-19 | Disposition: A | Discharge status: Home / Self Care | Payer: BLUE CROSS/BLUE SHIELD | Source: Ambulatory Visit | Attending: Obstetrics and Gynecology | Admitting: Obstetrics and Gynecology

## 2016-10-19 ENCOUNTER — Encounter: Payer: Self-pay | Admitting: Obstetrics and Gynecology

## 2016-10-19 ENCOUNTER — Ambulatory Visit (INDEPENDENT_AMBULATORY_CARE_PROVIDER_SITE_OTHER): Payer: BLUE CROSS/BLUE SHIELD | Admitting: Obstetrics and Gynecology

## 2016-10-19 VITALS — BP 116/72 | HR 76 | Resp 16 | Ht 65.5 in | Wt 234.0 lb

## 2016-10-19 DIAGNOSIS — Z01419 Encounter for gynecological examination (general) (routine) without abnormal findings: Secondary | ICD-10-CM | POA: Diagnosis not present

## 2016-10-19 DIAGNOSIS — T8332XA Displacement of intrauterine contraceptive device, initial encounter: Secondary | ICD-10-CM

## 2016-10-19 DIAGNOSIS — N898 Other specified noninflammatory disorders of vagina: Secondary | ICD-10-CM

## 2016-10-19 DIAGNOSIS — Z113 Encounter for screening for infections with a predominantly sexual mode of transmission: Secondary | ICD-10-CM

## 2016-10-19 DIAGNOSIS — N951 Menopausal and female climacteric states: Secondary | ICD-10-CM | POA: Diagnosis not present

## 2016-10-19 NOTE — Patient Instructions (Signed)

## 2016-10-20 LAB — FOLLICLE STIMULATING HORMONE: FSH: 8.2 m[IU]/mL

## 2016-10-20 LAB — ESTRADIOL: Estradiol: 349.7 pg/mL

## 2016-10-20 LAB — HEP, RPR, HIV PANEL
HIV Screen 4th Generation wRfx: NONREACTIVE
Hepatitis B Surface Ag: NEGATIVE
RPR Ser Ql: NONREACTIVE

## 2016-10-20 LAB — HSV(HERPES SMPLX)ABS-I+II(IGG+IGM)-BLD
HSV 1 Glycoprotein G Ab, IgG: 44.8 index — ABNORMAL HIGH (ref 0.00–0.90)
HSV 2 IGG, TYPE SPEC: 13.4 {index} — AB (ref 0.00–0.90)
HSVI/II Comb IgM: <0.91 Ratio (ref 0.00–0.90)

## 2016-10-20 LAB — HEPATITIS C ANTIBODY: Hep C Virus Ab: <0.1 {s_co_ratio} (ref 0.0–0.9)

## 2016-10-22 LAB — CYTOLOGY - PAP
Bacterial vaginitis: POSITIVE — AB
CANDIDA VAGINITIS: POSITIVE — AB
CHLAMYDIA, DNA PROBE: NEGATIVE
Diagnosis: NEGATIVE
HPV: NOT DETECTED
NEISSERIA GONORRHEA: NEGATIVE
TRICH (WINDOWPATH): NEGATIVE

## 2016-10-24 ENCOUNTER — Encounter: Payer: Self-pay | Admitting: Obstetrics and Gynecology

## 2016-10-25 ENCOUNTER — Telehealth: Payer: Self-pay

## 2016-10-25 NOTE — Telephone Encounter (Signed)
-----   Message from Nunzio Cobbs, MD sent at 10/24/2016  6:30 PM EDT ----- Please report results to patient.  Pap and HR HPV negative.  Recall - 02.   Testing is positive for bacterial vaginosis and yeast.  She may treat with Flagyl 500 mg po bid for 1 week for with Metrogel 0.75% pv at hs for 5 nights.  ETOH precautions.  For the yeast, she can treat with Diflucan 150 mg po x 1 and repeat at the end of the treatment for BV.  Dispense 2, RF none.  She is not menopausal according to her [facility] and estradiol levels.  Testing is negative for HIV, syphilis, hep B and C, gonorrhea, chlamydia, and trichomonas.   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  These indicate prior and not recent exposure.  I invite her to return to the office for a visit if she would like the discuss the herpes.  I am sure she will have a lot of questions.   Cc- Marisa Sprinkles

## 2016-10-27 MED ORDER — FLUCONAZOLE 150 MG PO TABS: 150.0000 mg | ORAL_TABLET | Freq: Once | ORAL | 0 refills | Status: AC

## 2016-10-27 MED ORDER — METRONIDAZOLE 500 MG PO TABS: 500.0000 mg | ORAL_TABLET | Freq: Two times a day (BID) | ORAL | 0 refills | Status: DC

## 2016-10-27 NOTE — Telephone Encounter (Signed)
Spoke with patient. Results given. Patient verbalizes understanding. Rx for Diflucan 150 mg po x 1 repeat after taking Flagyl. Flagyl 500 mg BID x 7 days #14 0RF sent to pharmacy on file. Patient does not have any additional questions at this time regarding results. 02 recall placed. Encounter closed.

## 2016-11-04 ENCOUNTER — Other Ambulatory Visit: Payer: BLUE CROSS/BLUE SHIELD

## 2016-11-04 ENCOUNTER — Telehealth: Payer: Self-pay | Admitting: Obstetrics and Gynecology

## 2016-11-04 ENCOUNTER — Other Ambulatory Visit: Payer: BLUE CROSS/BLUE SHIELD | Admitting: Obstetrics and Gynecology
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
## 2016-11-04 NOTE — Telephone Encounter (Signed)
Thank you for the update!

## 2016-12-13 ENCOUNTER — Telehealth: Payer: Self-pay | Admitting: Internal Medicine

## 2016-12-13 DIAGNOSIS — R51 Headache: Principal | ICD-10-CM

## 2016-12-13 DIAGNOSIS — R519 Headache, unspecified: Secondary | ICD-10-CM

## 2016-12-13 NOTE — Telephone Encounter (Signed)
Copied from [place] [phone]. Topic: Referral - Request >> Dec 13, 2016  2:55 PM Cleaster Corin, [place] wrote: CRM for notification. See Telephone encounter for:   12/13/16. Reason for CRM: pt. Wants to see if she can get a referral to headache specialist. Botox shots not helping at all still having migraines and wants another opinion pt. Would like to have an appt. Before February before next Botox appt.    [facility]  [phone] [facility] neurology [phone] [facility]  Cameron Ali [phone]

## 2016-12-14 NOTE — Telephone Encounter (Signed)
Please advise Dr Panosh, thanks.   

## 2016-12-15 NOTE — Telephone Encounter (Addendum)
Please document her current prescriber.   Okay to refer for  Persistent headaches    but  She will have to get your records sent to the new provider

## 2016-12-21 NOTE — Telephone Encounter (Signed)
Pt states that she currently sees Dr Domingo Cocking and feels that she needs to get established with someone else.  Referral placed to [facility] [phone] Nothing further needed.

## 2016-12-27 ENCOUNTER — Telehealth: Payer: Self-pay | Admitting: Obstetrics and Gynecology

## 2016-12-27 NOTE — Telephone Encounter (Signed)
Left message to call Nely Dedmon at [phone]. 

## 2016-12-27 NOTE — Telephone Encounter (Signed)
Spoke with patient. Patient declines to schedule PUS at an outside facility. States she only wants this performed with Dr.Silva. PUS appointment scheduled for 01/06/2017 at 11 am with 11:30 am consult with Dr.Silva. Patient is agreeable to date and time.  Routing to provider for final review. Patient agreeable to disposition. Will close encounter.

## 2016-12-27 NOTE — Telephone Encounter (Signed)
Please contact patient to schedule a pelvic US at [facility] to do an IUD check.  She has an IUD and I was unable to see the strings on her pelvic exam on 10/19/16. She really needs to have he position of the IUD confirmed.  She has cancelled 2 ultrasound appointments in the office due to illness.

## 2016-12-28 ENCOUNTER — Telehealth: Payer: Self-pay | Admitting: *Deleted

## 2016-12-28 NOTE — Telephone Encounter (Signed)
Patient in 04 recall for 10/2016. Patient needs 6 month DX and U/S imaging. Please contact patient regarding scheduling.

## 2017-01-01 ENCOUNTER — Other Ambulatory Visit: Payer: Self-pay | Admitting: Internal Medicine

## 2017-01-03 MED ORDER — PANTOPRAZOLE SODIUM 40 MG PO TBEC: 40.0000 mg | DELAYED_RELEASE_TABLET | Freq: Every day | ORAL | 0 refills | Status: DC

## 2017-01-05 NOTE — Telephone Encounter (Signed)
Called patient at [phone] to remind her it is time to schedule 6 month Lt.Br.DX MMG and U/S. Left message on voicemail for patient to call me back.

## 2017-01-06 ENCOUNTER — Telehealth: Payer: Self-pay | Admitting: Internal Medicine

## 2017-01-06 ENCOUNTER — Other Ambulatory Visit: Payer: BLUE CROSS/BLUE SHIELD | Admitting: Obstetrics and Gynecology

## 2017-01-06 ENCOUNTER — Other Ambulatory Visit: Payer: BLUE CROSS/BLUE SHIELD

## 2017-01-06 NOTE — Telephone Encounter (Signed)
Copied from [place]. Topic: Quick Communication - See Telephone Encounter >> Jan 06, 2017 11:54 AM Corie Chiquito, [place] wrote: CRM for notification. See Telephone encounter for: Patient would like to know why she doesn't have a year supply of her Protonix, Maxzide-25, and Effexor. If someone could please give her a call back about this at [phone]  01/06/17.

## 2017-01-07 NOTE — Telephone Encounter (Signed)
Left message that pt. Needs office visit in order to obtain more refills.

## 2017-01-10 NOTE — Telephone Encounter (Signed)
Patient is scheduled for Lt.DX MMG and Lt.U/S on 01-12-17 at [facility].

## 2017-01-12 ENCOUNTER — Other Ambulatory Visit: Payer: BLUE CROSS/BLUE SHIELD

## 2017-01-20 ENCOUNTER — Telehealth: Payer: Self-pay

## 2017-01-20 DIAGNOSIS — G5603 Carpal tunnel syndrome, bilateral upper limbs: Secondary | ICD-10-CM | POA: Insufficient documentation

## 2017-01-20 DIAGNOSIS — R200 Anesthesia of skin: Secondary | ICD-10-CM | POA: Insufficient documentation

## 2017-01-20 NOTE — Telephone Encounter (Signed)
Called patient to reschedule appointment for Lt.Br.DX MMG and U/S. Patient was on recall and did not keep appointment 01-12-17 with [facility]. Left message to return my call.

## 2017-01-27 ENCOUNTER — Encounter: Payer: Self-pay | Admitting: Obstetrics and Gynecology

## 2017-01-27 NOTE — Telephone Encounter (Signed)
Spoke with patient and states she had an emergency on 01-12-17 and missed Lt.Br.DX MMG and U/S. She will call to reschedule appointment with [facility].

## 2017-01-31 ENCOUNTER — Other Ambulatory Visit: Payer: Self-pay | Admitting: Internal Medicine

## 2017-02-11 ENCOUNTER — Other Ambulatory Visit: Payer: Self-pay

## 2017-02-11 ENCOUNTER — Encounter: Payer: Self-pay | Admitting: Obstetrics & Gynecology

## 2017-02-11 ENCOUNTER — Telehealth: Payer: Self-pay | Admitting: Obstetrics and Gynecology

## 2017-02-11 ENCOUNTER — Ambulatory Visit (INDEPENDENT_AMBULATORY_CARE_PROVIDER_SITE_OTHER): Payer: BLUE CROSS/BLUE SHIELD | Admitting: Obstetrics & Gynecology

## 2017-02-11 VITALS — BP 122/78 | HR 84 | Resp 16 | Wt 239.0 lb

## 2017-02-11 DIAGNOSIS — N921 Excessive and frequent menstruation with irregular cycle: Secondary | ICD-10-CM

## 2017-02-11 DIAGNOSIS — Z975 Presence of (intrauterine) contraceptive device: Secondary | ICD-10-CM

## 2017-02-11 DIAGNOSIS — D259 Leiomyoma of uterus, unspecified: Secondary | ICD-10-CM

## 2017-02-11 DIAGNOSIS — N898 Other specified noninflammatory disorders of vagina: Secondary | ICD-10-CM | POA: Diagnosis not present

## 2017-02-11 DIAGNOSIS — R208 Other disturbances of skin sensation: Secondary | ICD-10-CM | POA: Diagnosis not present

## 2017-02-11 NOTE — Progress Notes (Signed)
GYNECOLOGY  VISIT  CC:   Burning sensation on buttocks  HPI: 47 y.o. [id number] Single African American female here for possible HSV outbreak.  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  She's never had an outbreak and wonders if the symptoms she is having are HSV.  She called today to see if Valtrex could be called in for her.  I advised she be see for this.    Pt reports she has the sensation that her skin is on fire.  She has no blisters or skin lesions.  She does feel like she has vaginal discharge as well and would like this evaluated.  Does not have any current concerns for STDs.  GYNECOLOGIC HISTORY: Patient's last menstrual period was 02/01/2017. Contraception: Mirena IUD placed 06/19/15 Menopausal hormone therapy: none  Patient Active Problem List   Diagnosis Date Noted  . Morbid obesity ([place]) 01/30/2015  . 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 RUQ pain 02/25/2014  . Diarrhea 02/25/2014  . Calcium oxalate crystals in urine 02/22/2014  . Intramural leiomyoma of uterus 02/22/2014  . Hx of migraine headaches 02/04/2014  . Medication management 02/04/2014  . Hx of endometriosis 02/04/2014  . Right lower quadrant abdominal pain mid also 02/04/2014  . Adjustment disorder with anxious mood 01/17/2014  . Thyroid mass 02/16/2013  . Fever, unspecified 02/15/2013  . Sinusitis 02/15/2013  . Anxiety 12/11/2011  . Stress 12/11/2011  . Irregular periods 08/17/2011  . Endometriosis 08/17/2011  . Edema 05/10/2011  . Elevated blood pressure reading 05/10/2011  . Fatigue 04/08/2011  . Protracted URI 04/08/2011  . SINUSITIS, RECURRENT 11/06/2009  . FATIGUE 02/25/2009  . PALPITATIONS 02/25/2009  . DYSPNEA 02/25/2009  . KNEE INJURY, RIGHT 12/05/2007  . HEADACHE 05/03/2007  . ADULT SITUATIONAL REACTION 11/21/2006  . SLEEPLESSNESS 11/21/2006  . CHEST PAIN 11/21/2006  . TINEA PEDIS 08/31/2006  . Other and unspecified hyperlipidemia  08/31/2006  . DISORDER, ADJUSTMENT W/ANXIETY 08/31/2006  . PLANTAR FASCIITIS 08/31/2006  . ANEMIA-NOS 06/23/2006  . MIGRAINE, COMMON 06/23/2006  . ALLERGIC RHINITIS 06/23/2006  . GERD 06/23/2006  . IRRITABLE BOWEL SYNDROME 06/23/2006    Past Medical History:  Diagnosis Date  . Allergic rhinitis    hx of testing and allergic to spring and fall f= pollens  . Anemia    nos  . Anxiety   . Chest pain    neg stress test summer 2010  . Endometriosis   . Endometriosis   . Fibroids   . GERD (gastroesophageal reflux disease)   . Goiter   . Headache(784.0)   . 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 Hyperlipidemia   . Hypertension   . Irritable bowel syndrome   . Labial cyst 04/08/2011   early infection use hotpcompresses    . Migraines   . Ovarian torsion   . Sinusitis   . STD (sexually transmitted disease)    Pos. Chlamydia--in her 60's  . Thyroid disease    goiter    Past Surgical History:  Procedure Laterality Date  . CESAREAN SECTION  2002, 2004   x2  . PELVIC LAPAROSCOPY     x9  . RADIOLOGY WITH ANESTHESIA N/A 09/05/2014   Procedure: MRI OF BRAIN WITHOUT CONTRAST;  Surgeon: Medication Radiologist, MD;  Location: [facility];  Service: Radiology;  Laterality: N/A;  MRI/DR. JAFFE  . TUBAL LIGATION    . UNILATERAL SALPINGECTOMY     right  age  23 ectopics.    MEDS:   Current Outpatient Medications on File Prior to Visit  Medication Sig Dispense Refill  . baclofen (LIORESAL) 10 MG tablet take ONE-HALF to 1 TAB AS NEEDED to TWICE DAILY, headache. Limit to 2 headache days per week. Avoid daily use.  0  . gabapentin (NEURONTIN) 100 MG capsule Start 1 at night, increase as tolerated to 6 a day    . Multiple Vitamin (MULTIVITAMIN WITH MINERALS) TABS tablet Take 1 tablet by mouth daily.    . pantoprazole (PROTONIX) 40 MG tablet Take 1 tablet (40 mg total) by mouth daily. 30 tablet 2  . promethazine (PHENERGAN) 25 MG tablet Take 1 tablet  (25 mg total) by mouth every 6 (six) hours as needed for nausea or vomiting. 60 tablet 1  . triamterene-hydrochlorothiazide (MAXZIDE-25) 37.5-25 MG tablet Take 1 tablet by mouth daily. 90 tablet 3  . venlafaxine XR (EFFEXOR-XR) 150 MG 24 hr capsule Take 1 capsule (150 mg total) by mouth daily with breakfast. 90 capsule 3  . zonisamide (ZONEGRAN) 100 MG capsule Take 400 mg by mouth at bedtime.   1   No current facility-administered medications on file prior to visit.     ALLERGIES: Codeine phosphate; Moxifloxacin; Sulfamethoxazole; and Sumatriptan succinate  Family History  Problem Relation Age of Onset  . Anxiety disorder Mother        mother is on xanax and apparently applying for  social security diabilty for her anxiety. t  . Depression Mother   . Diabetes Mother   . Hypertension Mother   . Heart attack Father   . Seizures Father   . Stroke Maternal Grandmother   . Diabetes Paternal Grandmother   . Hypertension Paternal Grandmother   . Coronary artery disease Unknown   . Diabetes Unknown   . Hyperlipidemia Unknown   . Other Unknown        history of child death in family   . Colon cancer Neg Hx   . Stomach cancer Neg Hx   . Esophageal cancer Neg Hx     SH:  Single, non smoker  Review of Systems  All other systems reviewed and are negative.   PHYSICAL EXAMINATION:    BP 122/78 (BP Location: Right Arm, Patient Position: Sitting, Cuff Size: Large)   Pulse 84   Resp 16   Wt 239 lb (108.4 kg)   LMP 02/01/2017   BMI 39.17 kg/m     General appearance: alert, cooperative and appears stated age Abdomen: soft, non-tender; bowel sounds normal; no masses,  no organomegaly  Pelvic: External genitalia:  Absolutely no evidence of HSV present today              Urethra:  normal appearing urethra with no masses, tenderness or lesions              Bartholins and Skenes: normal                 Vagina: normal appearing vagina with minimal discharge, no lesions               Cervix: no lesions              Bimanual Exam:  Uterus:  enlarged, 12 weeks size              Adnexa: no mass, fullness, tenderness              Anus:  normal sphincter tone, no lesions  Chaperone was present for exam.  Reviewed with pt all medications.  She has been increasing the dosage of Zonagran and this could be causing the symptoms she is experiencing.  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  Repeat PUS has been recommended.  Ready to schedule.  Plan: Affirm pending.  Resutls will be communicated to pt. She is going to decrease Zonegran to see if this helps and communicate with her neurologist.   PUS scheduled today before pt left office
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
## 2017-02-11 NOTE — Telephone Encounter (Signed)
Patient thinks she is having an outbreak.  States her "bottom is on fire"  Has been getting worse for about 3 days.

## 2017-02-11 NOTE — Telephone Encounter (Signed)
Call reviewed by Dr Sabra Heck. Recommends office visit for evaluation.  Patient notified. Come now for work-in appointment.   Routing to provider for final review. Patient agreeable to disposition. Will close encounter.

## 2017-02-12 LAB — VAGINITIS/VAGINOSIS, DNA PROBE
CANDIDA SPECIES: NEGATIVE
GARDNERELLA VAGINALIS: NEGATIVE
TRICHOMONAS VAG: NEGATIVE

## 2017-02-13 ENCOUNTER — Encounter: Payer: Self-pay | Admitting: Obstetrics & Gynecology

## 2017-02-14 ENCOUNTER — Other Ambulatory Visit: Payer: Self-pay | Admitting: *Deleted

## 2017-02-14 MED ORDER — ESTRADIOL 0.1 MG/GM VA CREA: 1.0000 | TOPICAL_CREAM | VAGINAL | Status: DC

## 2017-02-15 NOTE — Telephone Encounter (Signed)
Patient did not show for her follow up breast imaging on 01/12/17.  Call to patient regarding the need for rescheduling- left message to call back -eh

## 2017-02-17 ENCOUNTER — Ambulatory Visit (INDEPENDENT_AMBULATORY_CARE_PROVIDER_SITE_OTHER): Payer: BLUE CROSS/BLUE SHIELD

## 2017-02-17 ENCOUNTER — Ambulatory Visit: Payer: BLUE CROSS/BLUE SHIELD | Admitting: Obstetrics & Gynecology

## 2017-02-17 VITALS — BP 130/88 | HR 92 | Resp 16

## 2017-02-17 DIAGNOSIS — T8332XA Displacement of intrauterine contraceptive device, initial encounter: Secondary | ICD-10-CM

## 2017-02-17 DIAGNOSIS — D251 Intramural leiomyoma of uterus: Secondary | ICD-10-CM

## 2017-02-17 NOTE — Progress Notes (Signed)
47 y.o. [id number] Single African American female here for pelvic ultrasound to recheck fibroids.  Pt normally followed by Dr. Quincy Simmonds and follow up ultrasound has been postponed due to Dr. Quincy Simmonds being out of the office.  Pt desirous to proceed at this time as she feels fibroids are larger.  Not taking micronor that was previously prescribed.  Skin itching, irritation is much improved.  Have decided this must have been a medication effect from the [place].  Is taking 300mg  at this time.  Has communicated with neurologist, Dr. Doy Hutching.  Patient's last menstrual period was 02/01/2017.  Contraception: tubal ligation  Findings:  UTERUS: 10.8 x 6.3 x 6.7cm with 4.5 x 4.1cm fibroid (was 4.1 x 3.5 x 3.1cm), IUD seen within endometrial cavity with strings 1.2cm from os EMS: 77mm ADNEXA: Left ovary: surgically absent       Right ovary: 3.8 x 2.8 x 9.5VU with 1.5 cm follicle CUL DE SAC:  No free fluid  Discussion:  Findings reviewed.  Pt is tired of pain and bleeding and treatment with hormonal therapy and desires definitive treatment.  Would like Dr. Quincy Simmonds to perform surgery if she feels comfortable.  Willing to wait until she is back and ready to schedule surgery.  Assessment:  Fibroid uterus H/O LSO  Plan:  Ready to proceed with more definitive treatment of fibroid and bleeding.  Will let Dr. Quincy Simmonds know about this and pt's desire to proceed with hysterectomy.  All questions answered to best of my ability.  ~20 minutes spent with patient >50% of time was in face to face discussion of above.

## 2017-02-21 NOTE — Telephone Encounter (Signed)
Called patient and per DPR, left detailed message for [facility]. She can call me and I'll make appointment. Advised this was important and shouldn't be neglected.

## 2017-02-23 ENCOUNTER — Encounter: Payer: Self-pay | Admitting: Obstetrics & Gynecology

## 2017-02-23 NOTE — Telephone Encounter (Signed)
Dr.Silva, Patient is an 04 recall for Lt.Br.Dx MMG and U/S. She did not keep appointment with [facility] 01-12-17. I have called several times but she still has not scheduled follow up. Remove from recall? Routed to provider

## 2017-02-25 NOTE — Telephone Encounter (Signed)
Please send our mammogram reminder letter to the patient and then remove from recall.

## 2017-02-28 ENCOUNTER — Encounter: Payer: Self-pay | Admitting: *Deleted

## 2017-02-28 NOTE — Telephone Encounter (Signed)
Letter done- removed from recall -eh

## 2017-02-28 NOTE — Telephone Encounter (Signed)
Routing to [facility] for letter and remove from recall.

## 2017-03-01 ENCOUNTER — Other Ambulatory Visit: Payer: Self-pay | Admitting: Internal Medicine

## 2017-03-04 ENCOUNTER — Encounter: Payer: Self-pay | Admitting: Obstetrics and Gynecology

## 2017-03-04 ENCOUNTER — Other Ambulatory Visit: Payer: Self-pay

## 2017-03-04 ENCOUNTER — Ambulatory Visit: Payer: BLUE CROSS/BLUE SHIELD | Admitting: Obstetrics and Gynecology

## 2017-03-04 VITALS — BP 116/62 | HR 72 | Resp 16 | Wt 236.0 lb

## 2017-03-04 DIAGNOSIS — T8332XD Displacement of intrauterine contraceptive device, subsequent encounter: Secondary | ICD-10-CM

## 2017-03-04 DIAGNOSIS — N946 Dysmenorrhea, unspecified: Secondary | ICD-10-CM

## 2017-03-04 MED ORDER — ELAGOLIX SODIUM 150 MG PO TABS: 150.0000 mg | ORAL_TABLET | Freq: Every day | ORAL | 2 refills | Status: DC
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
## 2017-03-07 ENCOUNTER — Telehealth: Payer: Self-pay | Admitting: Obstetrics and Gynecology

## 2017-03-07 NOTE — Telephone Encounter (Signed)
Patient called stating that she needs a prior authorization for her [name]. Confirmed pharmacy on file.

## 2017-03-07 NOTE — Telephone Encounter (Signed)
Call to patient. Advised PA has been submitted via Cover My Meds to her insurance company. Advised this can take up to 72 hours to hear back on. Advised will contact her once we have received response from insurance company.

## 2017-03-10 ENCOUNTER — Ambulatory Visit
Admission: RE | Admit: 2017-03-10 | Discharge: 2017-03-10 | Disposition: A | Discharge status: Home / Self Care | Payer: BLUE CROSS/BLUE SHIELD | Source: Ambulatory Visit | Attending: Obstetrics and Gynecology | Admitting: Obstetrics and Gynecology

## 2017-03-10 DIAGNOSIS — N6320 Unspecified lump in the left breast, unspecified quadrant: Secondary | ICD-10-CM

## 2017-03-11 NOTE — Telephone Encounter (Signed)
Spoke with patient. Advised Orilissa 150 mg has been approved for coverage. Patient states she was able to pipck up her rx and has already started. Will call with any questions or concerns.  Routing to provider for final review. Patient agreeable to disposition. Will close encounter.

## 2017-03-31 ENCOUNTER — Ambulatory Visit (INDEPENDENT_AMBULATORY_CARE_PROVIDER_SITE_OTHER): Payer: BLUE CROSS/BLUE SHIELD | Admitting: Family Medicine

## 2017-03-31 ENCOUNTER — Encounter: Payer: Self-pay | Admitting: Family Medicine

## 2017-03-31 ENCOUNTER — Other Ambulatory Visit: Payer: Self-pay

## 2017-03-31 VITALS — BP 106/72 | HR 101 | Temp 98.4°F | Resp 16 | Ht 65.5 in | Wt 226.0 lb

## 2017-03-31 DIAGNOSIS — J301 Allergic rhinitis due to pollen: Secondary | ICD-10-CM

## 2017-03-31 DIAGNOSIS — G43419 Hemiplegic migraine, intractable, without status migrainosus: Secondary | ICD-10-CM | POA: Diagnosis not present

## 2017-03-31 DIAGNOSIS — H5711 Ocular pain, right eye: Secondary | ICD-10-CM | POA: Diagnosis not present

## 2017-03-31 NOTE — Patient Instructions (Addendum)
   You should not take Dextromorphan or Phenylephrine These are common found in decongestant and cold medications This can cause very high pulses and high blood pressure. They can also worsen headaches.   Ask you pharmacist to make sure this ingredient is not present     IF you received an x-ray today, you will receive an invoice from [facility] Radiology. Please contact [facility]iology at [phone] with questions or concerns regarding your invoice.   IF you received labwork today, you will receive an invoice from [facility]. Please contact [facility] at [phone] with questions or concerns regarding your invoice.   Our billing staff will not be able to assist you with questions regarding bills from these companies.  You will be contacted with the lab results as soon as they are available. The fastest way to get your results is to activate your My Chart account. Instructions are located on the last page of this paperwork. If you have not heard from [place] regarding the results in 2 weeks, please contact this office.

## 2017-03-31 NOTE — Progress Notes (Signed)
Chief Complaint  Patient presents with  . migraines    onset: yesterday, bad migraine and then the eye pain started.  Hx of migraines since she was young.    HPI  Right eye pain migraines Pt reports that she had a bad migraine yesterday 03/29/17 She states that she has a good day if her migraines is 3/10 pain or less On  03/29/17 she had a headache that was similar to her typical migraine but is 7/10 She states that her right eye started having pain that is similar to the pain she gets on her left eye She reports that she always gets left eye pain from her migraines that feel like gritty sandpaper and pressure around the eye.  This is how it feels on her right eye. She states that she typically gets symptoms on the left side of the body from her migraines including left eye pain, left arm and body pain. She states that having right eye pain is new and concerning She states that her overall pain is 5/10  She saw Neurology 01/20/17 and was prescribed Zonegran 400mg /d, aimovig, gabapentin 100mg  as well as vitamin D. She states that her migraines have improved slightly.     Past Medical History:  Diagnosis Date  . Allergic rhinitis    hx of testing and allergic to spring and fall f= pollens  . Anemia    nos  . Anxiety   . Chest pain    neg stress test summer 2010  . Endometriosis   . Endometriosis   . Fibroids   . GERD (gastroesophageal reflux disease)   . Goiter   . Headache(784.0)   . 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 Hyperlipidemia   . Hypertension   . Irritable bowel syndrome   . Labial cyst 04/08/2011   early infection use hotpcompresses    . Migraines   . Ovarian torsion   . Sinusitis   . STD (sexually transmitted disease)    Pos. Chlamydia--in her 34's  . Thyroid disease    goiter    Current Outpatient Medications  Medication Sig Dispense Refill  . baclofen (LIORESAL) 10 MG tablet take ONE-HALF to 1 TAB AS  NEEDED to TWICE DAILY, headache. Limit to 2 headache days per week. Avoid daily use.  0  . Elagolix Sodium (ORILISSA) 150 MG TABS Take 150 mg by mouth daily. 30 tablet 2  . Erenumab-aooe (AIMOVIG 140 DOSE [place]) Inject 140 mg into the skin.    Marland Kitchen gabapentin (NEURONTIN) 100 MG capsule Start 1 at night, increase as tolerated to 6 a day    . Multiple Vitamin (MULTIVITAMIN WITH MINERALS) TABS tablet Take 1 tablet by mouth daily.    . pantoprazole (PROTONIX) 40 MG tablet Take 1 tablet (40 mg total) by mouth daily. 30 tablet 2  . promethazine (PHENERGAN) 25 MG tablet Take 1 tablet (25 mg total) by mouth every 6 (six) hours as needed for nausea or vomiting. 60 tablet 1  . triamterene-hydrochlorothiazide (MAXZIDE-25) 37.5-25 MG tablet Take 1 tablet by mouth daily. 90 tablet 0  . venlafaxine XR (EFFEXOR-XR) 150 MG 24 hr capsule Take 1 capsule (150 mg total) by mouth daily with breakfast. 90 capsule 0  . zonisamide (ZONEGRAN) 100 MG capsule Take 400 mg by mouth at bedtime.   1   Current Facility-Administered Medications  Medication Dose Route Frequency Provider Last Rate Last Dose  . estradiol (ESTRACE) vaginal cream 1  Applicatorful  1 Applicatorful Vaginal Once per day on Mon Thu Miller, Mary S, MD        Allergies:  Allergies  Allergen Reactions  . Codeine Phosphate Hives  . Moxifloxacin     REACTION: cause strange sensation, joint pain. Could not get thumbs to move. Stiffness.  . Sulfamethoxazole Hives  . Sumatriptan Succinate Palpitations    Past Surgical History:  Procedure Laterality Date  . CESAREAN SECTION  2002, 2004   x2  . PELVIC LAPAROSCOPY     x9  . RADIOLOGY WITH ANESTHESIA N/A 09/05/2014   Procedure: MRI OF BRAIN WITHOUT CONTRAST;  Surgeon: Medication Radiologist, MD;  Location: [place];  Service: Radiology;  Laterality: N/A;  MRI/DR. JAFFE  . TUBAL LIGATION    . UNILATERAL SALPINGECTOMY Right 1995   ectopic pregnancy    Social History   Socioeconomic History  . Marital  status: Single    Spouse name: None  . Number of children: None  . Years of education: None  . Highest education level: None  Social Needs  . Financial resource strain: None  . Food insecurity - worry: None  . Food insecurity - inability: None  . Transportation needs - medical: None  . Transportation needs - non-medical: None  Occupational History  . None  Tobacco Use  . Smoking status: Never Smoker  . Smokeless tobacco: Never Used  Substance and Sexual Activity  . Alcohol use: No    Alcohol/week: 0.0 oz  . Drug use: No  . Sexual activity: Yes    Partners: Male    Birth control/protection: Surgical    Comment: Tubal/Mirena IUD inserted 06-19-15  Other Topics Concern  . 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   And graduated as SW.      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 Anxiety disorder Mother        mother is on xanax and apparently applying for  social security diabilty for her anxiety. t  . Depression Mother   . Diabetes Mother   . Hypertension Mother   . Heart attack Father   . Seizures Father   . Stroke Maternal Grandmother   . Diabetes Paternal Grandmother   . Hypertension Paternal Grandmother   . Coronary artery disease Unknown   . Diabetes Unknown   . Hyperlipidemia Unknown   . Other Unknown        history of child death in family   . Colon cancer Neg Hx   . Stomach cancer Neg Hx   . Esophageal cancer Neg Hx      ROS Review of Systems See  HPI Constitution: No fevers or chills No malaise No diaphoresis Skin: No rash or itching Eyes: no blurry vision, no double vision GU: no dysuria or hematuria Neuro: no  dizziness or slurred speech all others reviewed and negative   Objective: Vitals:   03/31/17 1413  BP: 106/72  Pulse: (!) 101  Resp: 16  Temp: 98.4 F (36.9 C)  TempSrc: Oral  SpO2: 99%  Weight: 226 lb (102.5 kg)  Height: 5' 5.5" (1.664 m)    Physical Exam  Constitutional: She is oriented to person, place, and time. She appears well-developed and well-nourished.  HENT:  Head: Normocephalic and atraumatic.  Right Ear: External ear normal.  Left Ear: External ear normal.  Mouth/Throat: Oropharynx is clear and moist.  Eyes: Pupils are equal, round, and reactive to light. Conjunctivae and EOM are normal.  Fundoscopic exam:      The right eye shows no AV nicking, no hemorrhage and no papilledema.       The left eye shows no AV nicking, no hemorrhage and no papilledema.  Neck: Normal range of motion. No thyromegaly present.  Cardiovascular: Normal rate, regular rhythm and normal heart sounds.  No murmur heard. Pulmonary/Chest: Effort normal and breath sounds normal. No stridor. No respiratory distress. She has no wheezes.  Abdominal: Soft. Bowel sounds are normal. She exhibits no distension and no mass. There is no tenderness. There is no rebound and no guarding.  Neurological: She is alert and oriented to person, place, and time. She displays normal reflexes. No cranial nerve deficit or sensory deficit. She exhibits normal muscle tone. Coordination normal.  Skin: Skin is warm. Capillary refill takes less than 2 seconds.  Psychiatric: She has a normal mood and affect. Her behavior is normal. Judgment and thought content normal.      Assessment and Plan Onetta was seen today for migraines.  Diagnoses and all orders for this visit:  Intractable hemiplegic migraine without status migrainosus Pain of right  eye -  Normal eye exam including normal fundoscopic exam  Reviewed Neurology note Patient advised to continue hydration, add juice to her fast and to keep taking her meds  Non-seasonal allergic rhinitis due to pollen -  Advised to add a nondrowsy antihistamine    Heberto Sturdevant A Nolon Rod

## 2017-04-04 ENCOUNTER — Other Ambulatory Visit: Payer: Self-pay | Admitting: Internal Medicine

## 2017-04-07 ENCOUNTER — Ambulatory Visit: Payer: BLUE CROSS/BLUE SHIELD | Admitting: Family Medicine

## 2017-04-07 ENCOUNTER — Encounter: Payer: Self-pay | Admitting: Family Medicine

## 2017-04-07 ENCOUNTER — Ambulatory Visit: Payer: BLUE CROSS/BLUE SHIELD | Admitting: Internal Medicine

## 2017-04-07 VITALS — BP 134/78 | HR 83 | Temp 98.5°F | Ht 65.5 in | Wt 229.0 lb

## 2017-04-07 DIAGNOSIS — J069 Acute upper respiratory infection, unspecified: Secondary | ICD-10-CM

## 2017-04-07 DIAGNOSIS — G43419 Hemiplegic migraine, intractable, without status migrainosus: Secondary | ICD-10-CM | POA: Diagnosis not present

## 2017-04-07 MED ORDER — AZITHROMYCIN 250 MG PO TABS: ORAL_TABLET | ORAL | 0 refills | Status: DC

## 2017-04-07 NOTE — Progress Notes (Signed)
[name] - 47 y.o. female MRN [id number]  Date of birth: 08/17/1970  SUBJECTIVE:  Including CC & ROS.  Chief Complaint  Patient presents with  . Sinus Problem    [name] is a 47 y.o. female that is presenting with sinus pressure and migraines.   Sinus pressure has been ongoing for one week. Admits to light sensitivity. She has been taking Amovig since February with some improvement for her migraine. She previously was getting botox injections with no improvement. Admits to some sinus drainage green in color. Denies body aches and fevers.   Review of Systems  Constitutional: Negative for fever.  HENT: Positive for congestion and sinus pain.   Respiratory: Negative for cough.   Cardiovascular: Negative for chest pain.  Musculoskeletal: Negative for back pain.  Neurological: Positive for headaches. Negative for facial asymmetry.  Hematological: Negative for adenopathy.    HISTORY: Past Medical, Surgical, Social, and Family History Reviewed & Updated per EMR.   Pertinent Historical Findings include:  Past Medical History:  Diagnosis Date  . Allergic rhinitis    hx of testing and allergic to spring and fall f= pollens  . Anemia    nos  . Anxiety   . Chest pain    neg stress test summer 2010  . Endometriosis   . Endometriosis   . Fibroids   . GERD (gastroesophageal reflux disease)   . Goiter   . Headache(784.0)   . 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 Hyperlipidemia   . Hypertension   . Irritable bowel syndrome   . Labial cyst 04/08/2011   early infection use hotpcompresses    . Migraines   . Ovarian torsion   . Sinusitis   . STD (sexually transmitted disease)    Pos. Chlamydia--in her 33's  . Thyroid disease    goiter    Past Surgical History:  Procedure Laterality Date  . CESAREAN SECTION  2002, 2004   x2  . PELVIC LAPAROSCOPY     x9  . RADIOLOGY WITH ANESTHESIA N/A 09/05/2014   Procedure: MRI OF  BRAIN WITHOUT CONTRAST;  Surgeon: Medication Radiologist, MD;  Location: [place];  Service: Radiology;  Laterality: N/A;  MRI/DR. JAFFE  . TUBAL LIGATION    . UNILATERAL SALPINGECTOMY Right 1995   ectopic pregnancy    Allergies  Allergen Reactions  . Codeine Phosphate Hives  . Moxifloxacin     REACTION: cause strange sensation, joint pain. Could not get thumbs to move. Stiffness.  . Sulfamethoxazole Hives  . Sumatriptan Succinate Palpitations    Family History  Problem Relation Age of Onset  . Anxiety disorder Mother        mother is on xanax and apparently applying for  social security diabilty for her anxiety. t  . Depression Mother   . Diabetes Mother   . Hypertension Mother   . Heart attack Father   . Seizures Father   . Stroke Maternal Grandmother   . Diabetes Paternal Grandmother   . Hypertension Paternal Grandmother   . Coronary artery disease Unknown   . Diabetes Unknown   . Hyperlipidemia Unknown   . Other Unknown        history of child death in family   . Colon cancer Neg Hx   . Stomach cancer Neg Hx   . Esophageal cancer Neg Hx      Social History   Socioeconomic History  . Marital status:  Single    Spouse name: Not on file  . Number of children: Not on file  . Years of education: Not on file  . Highest education level: Not on file  Social Needs  . Financial resource strain: Not on file  . Food insecurity - worry: Not on file  . Food insecurity - inability: Not on file  . Transportation needs - medical: Not on file  . Transportation needs - non-medical: Not on file  Occupational History  . Not on file  Tobacco Use  . Smoking status: Never Smoker  . Smokeless tobacco: Never Used  Substance and Sexual Activity  . Alcohol use: No    Alcohol/week: 0.0 oz  . Drug use: No  . Sexual activity: Yes    Partners: Male    Birth control/protection: Surgical    Comment: Tubal/Mirena IUD inserted 06-19-15  Other Topics Concern  . 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   And graduated as SW.      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      PHYSICAL EXAM:  VS: BP 134/78 (BP Location: Left Arm, Patient Position: Sitting, Cuff Size: Large)   Pulse 83   Temp 98.5 F (36.9 C) (Oral)   Ht 5' 5.5" (1.664 m)   Wt 229 lb (103.9 kg)   SpO2 98%   BMI 37.53 kg/m  Physical Exam Gen: NAD, alert, cooperative with exam, well-appearing ENT: normal lips, normal nasal mucosa, tympanic membranes clear and intact bilaterally, normal oropharynx, no cervical lymphadenopathy Eye: normal EOM, normal conjunctiva and lids CV:  no edema, +2 pedal pulses, regular rate and rhythm, S1-S2   Resp: no accessory muscle use, non-labored, clear to auscultation bilaterally, no crackles or wheezes Skin: no rashes, no areas of induration  Neuro: normal tone, normal sensation to touch, CN 2-12 intact  Psych:  normal insight, alert and oriented MSK: Normal gait, normal strength       ASSESSMENT & PLAN:   Intractable hemiplegic migraine without status migrainosus Possible that her URI could have triggered her migraine. Will treat URI and see if that improves her symptoms.   Upper respiratory tract infection Likely viral in nature.  - provided azithro to have on hand  - counseled on supportive care

## 2017-04-07 NOTE — Patient Instructions (Signed)
Please try things such as zyrtec-D or allegra-D which is an antihistamine and decongestant.   Please try afrin which will help with nasal congestion but use for only three days.   Please also try using a netti pot on a regular occasion.  Honey can help with a sore throat.

## 2017-04-08 NOTE — Assessment & Plan Note (Signed)
Likely viral in nature.  - provided azithro to have on hand  - counseled on supportive care

## 2017-04-08 NOTE — Assessment & Plan Note (Signed)
Possible that her URI could have triggered her migraine. Will treat URI and see if that improves her symptoms.

## 2017-04-15 ENCOUNTER — Ambulatory Visit: Payer: BLUE CROSS/BLUE SHIELD | Admitting: Obstetrics and Gynecology

## 2017-05-03 ENCOUNTER — Encounter: Payer: BLUE CROSS/BLUE SHIELD | Admitting: Internal Medicine

## 2017-05-23 NOTE — Progress Notes (Signed)
GYNECOLOGY  VISIT   HPI: 47 y.o.   Single  Caucasian  female   [id number] with No LMP recorded. (Menstrual status: IUD).   here for follow up.  Needs IUD removal and a 3 month recheck of Orilissa use.  US showing IUD low in endometrial canal on 02/17/17.   (IUD strings were not visible on exam, and this prompted the ultrasound.) She declined earlier removal.  Now with light spotting only.  Hot flashes.  Decreased libido.   Taking the [facility] for about 3 months.  Having hot flashes, especially bothersome at night.  Not sleeping well.  Her insomnia is triggering her migraines.   Taking Amovig for migraines.   On Effexor for a long time for depression and migraines.   Was just on gabapentin for migraines, and she had worsening of her migraines.   Taking Maxide for HTN and fluid retention.   PCP is Dr. Regis Bill.   GYNECOLOGIC HISTORY: No LMP recorded. (Menstrual status: IUD). Contraception:  Tubal/Mirena IUD Menopausal hormone therapy: none Last mammogram:  04/30/16 MMG/US LEFT Breast BIRADS 4 Suspicious; 05/03/16 Clip placement/biopsy -- see EPIC for details.  Bx showed fibrocystic change.  Last pap smear: 10-19-16 Neg:Neg HR HPV, 09-10-13 Neg:Neg HR HPV        OB History    Gravida  7   Para  2   Term      Preterm      AB  5   Living  2     SAB  1   TAB  1   Ectopic  3   Multiple      Live Births           Obstetric Comments  3 ectopic           Patient Active Problem List   Diagnosis Date Noted  . Morbid obesity ([place]) 01/30/2015  . 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 RUQ pain 02/25/2014  . Diarrhea 02/25/2014  . Calcium oxalate crystals in urine 02/22/2014  . Intramural leiomyoma of uterus 02/22/2014  . Hx of migraine headaches 02/04/2014  . Medication management 02/04/2014  . Hx of endometriosis 02/04/2014  . Right lower quadrant abdominal pain mid also 02/04/2014  . Adjustment disorder with anxious mood  01/17/2014  . Thyroid mass 02/16/2013  . Fever, unspecified 02/15/2013  . Sinusitis 02/15/2013  . Anxiety 12/11/2011  . Stress 12/11/2011  . Irregular periods 08/17/2011  . Endometriosis 08/17/2011  . Edema 05/10/2011  . Elevated blood pressure reading 05/10/2011  . Fatigue 04/08/2011  . Upper respiratory tract infection 04/08/2011  . SINUSITIS, RECURRENT 11/06/2009  . FATIGUE 02/25/2009  . PALPITATIONS 02/25/2009  . DYSPNEA 02/25/2009  . KNEE INJURY, RIGHT 12/05/2007  . HEADACHE 05/03/2007  . ADULT SITUATIONAL REACTION 11/21/2006  . SLEEPLESSNESS 11/21/2006  . CHEST PAIN 11/21/2006  . TINEA PEDIS 08/31/2006  . Other and unspecified hyperlipidemia 08/31/2006  . DISORDER, ADJUSTMENT W/ANXIETY 08/31/2006  . PLANTAR FASCIITIS 08/31/2006  . ANEMIA-NOS 06/23/2006  . MIGRAINE, COMMON 06/23/2006  . ALLERGIC RHINITIS 06/23/2006  . GERD 06/23/2006  . IRRITABLE BOWEL SYNDROME 06/23/2006    Past Medical History:  Diagnosis Date  . Allergic rhinitis    hx of testing and allergic to spring and fall f= pollens  . Anemia    nos  . Anxiety   . Chest pain    neg stress test summer 2010  . Endometriosis   . Endometriosis   . Fibroids   . GERD (gastroesophageal reflux  disease)   . Goiter   . Headache(784.0)   . 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 Hyperlipidemia   . Hypertension   . Irritable bowel syndrome   . Labial cyst 04/08/2011   early infection use hotpcompresses    . Migraines   . Ovarian torsion   . Sinusitis   . STD (sexually transmitted disease)    Pos. Chlamydia--in her 72's  . Thyroid disease    goiter    Past Surgical History:  Procedure Laterality Date  . CESAREAN SECTION  2002, 2004   x2  . PELVIC LAPAROSCOPY     x9  . RADIOLOGY WITH ANESTHESIA N/A 09/05/2014   Procedure: MRI OF BRAIN WITHOUT CONTRAST;  Surgeon: Medication Radiologist, MD;  Location: [facility] [facility];  Service: Radiology;  Laterality: N/A;  MRI/DR.  JAFFE  . TUBAL LIGATION    . UNILATERAL SALPINGECTOMY Right 1995   ectopic pregnancy    Current Outpatient Medications  Medication Sig Dispense Refill  . AIMOVIG 140 DOSE 70 MG/ML SOAJ Inject 2 mLs (140 mg total) into the skin every 30 days for 30 days  2  . baclofen (LIORESAL) 10 MG tablet take ONE-HALF to 1 TAB AS NEEDED to TWICE DAILY, headache. Limit to 2 headache days per week. Avoid daily use.  0  . Elagolix Sodium (ORILISSA) 150 MG TABS Take 150 mg by mouth daily. 30 tablet 5  . Multiple Vitamin (MULTIVITAMIN WITH MINERALS) TABS tablet Take 1 tablet by mouth daily.    . pantoprazole (PROTONIX) 40 MG tablet Take 1 tablet (40 mg total) by mouth daily. 30 tablet 2  . promethazine (PHENERGAN) 25 MG tablet Take 1 tablet (25 mg total) by mouth every 6 (six) hours as needed for nausea or vomiting. 60 tablet 1  . triamterene-hydrochlorothiazide (MAXZIDE-25) 37.5-25 MG tablet Take 1 tablet by mouth daily. 90 tablet 0  . venlafaxine XR (EFFEXOR-XR) 150 MG 24 hr capsule Take 1 capsule (150 mg total) by mouth daily with breakfast. 90 capsule 0  . zonisamide (ZONEGRAN) 100 MG capsule Take 400 mg by mouth at bedtime.   1   No current facility-administered medications for this visit.      ALLERGIES: Codeine phosphate; Moxifloxacin; Sulfamethoxazole; and Sumatriptan succinate  Family History  Problem Relation Age of Onset  . Anxiety disorder Mother        mother is on xanax and apparently applying for  social security diabilty for her anxiety. t  . Depression Mother   . Diabetes Mother   . Hypertension Mother   . Heart attack Father   . Seizures Father   . Stroke Maternal Grandmother   . Diabetes Paternal Grandmother   . Hypertension Paternal Grandmother   . Coronary artery disease Unknown   . Diabetes Unknown   . Hyperlipidemia Unknown   . Other Unknown        history of child death in family   . Colon cancer Neg Hx   . Stomach cancer Neg Hx   . Esophageal cancer Neg Hx      Social History   Socioeconomic History  . Marital status: Single    Spouse name: Not on file  . Number of children: Not on file  . Years of education: Not on file  . Highest education level: Not on file  Occupational History  . Not on file  Social Needs  . Financial resource strain: Not on file  . Food  insecurity:    Worry: Not on file    Inability: Not on file  . Transportation needs:    Medical: Not on file    Non-medical: Not on file  Tobacco Use  . Smoking status: Never Smoker  . Smokeless tobacco: Never Used  Substance and Sexual Activity  . Alcohol use: No    Alcohol/week: 0.0 oz  . Drug use: No  . Sexual activity: Yes    Partners: Male    Birth control/protection: Surgical    Comment: Tubal/Mirena IUD inserted 06-19-15  Lifestyle  . Physical activity:    Days per week: Not on file    Minutes per session: Not on file  . Stress: Not on file  Relationships  . Social connections:    Talks on phone: Not on file    Gets together: Not on file    Attends religious service: Not on file    Active member of club or organization: Not on file    Attends meetings of clubs or organizations: Not on file    Relationship status: Not on file  . Intimate partner violence:    Fear of current or ex partner: Not on file    Emotionally abused: Not on file    Physically abused: Not on file    Forced sexual activity: Not on file  Other Topics Concern  . 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   And graduated as SW.      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  PHYSICAL EXAMINATION:    BP 124/78 (BP Location: Right Arm, Patient Position: Sitting, Cuff Size: Normal)   Pulse 88   Resp 14   Wt 225 lb (102.1 kg)   BMI 36.87 kg/m     General appearance: alert, cooperative and appears stated age  Pelvic: External genitalia:  no lesions              Urethra:  normal appearing urethra with no masses, tenderness or lesions              Bartholins and Skenes: normal                 Vagina: normal appearing vagina with normal color and discharge, no lesions              Cervix: no lesions                Bimanual Exam:  Uterus:  normal size, contour, position, consistency, mobility, non-tender              Adnexa: no mass, fullness, tenderness          Verbal consent for removal of IUD.  Sterile prep with Hibiclens.  Tenaculum to anterior cervical lip.  Os finder used to  gently dilate cervix.  Dressing forceps and then IUD remover used to find strings and pull them through cervix.  Dressing forceps then removed IUD intact, shown to patient, and discarded.   Chaperone was present for exam.  ASSESSMENT  Malpositioned IUD - removed.  Menopausal symptoms.  On Effexor.  Orilissa monitoring.  Status post BTL.   HTN.  PLAN  She will update her mammogram. Continue Orilissa until annual exam is due.  Will check LFTs today.  We discuss menopausal symptoms as a normal side effect of the [place].  We discussed Catapres transdermal for hot flashes.  She is already on Maxide, so I have sent a staff message to her PCP, Dr. Regis Bill, for input on this option.    An After Visit Summary was printed and given to the patient.  __25____ minutes face to face time of which over 50% was spent in counseling.

## 2017-05-24 ENCOUNTER — Ambulatory Visit: Payer: BLUE CROSS/BLUE SHIELD | Admitting: Obstetrics and Gynecology

## 2017-05-24 ENCOUNTER — Other Ambulatory Visit: Payer: Self-pay

## 2017-05-24 ENCOUNTER — Encounter: Payer: Self-pay | Admitting: Obstetrics and Gynecology

## 2017-05-24 VITALS — BP 124/78 | HR 88 | Resp 14 | Wt 225.0 lb

## 2017-05-24 DIAGNOSIS — Z5181 Encounter for therapeutic drug level monitoring: Secondary | ICD-10-CM

## 2017-05-24 DIAGNOSIS — T8332XD Displacement of intrauterine contraceptive device, subsequent encounter: Secondary | ICD-10-CM | POA: Diagnosis not present

## 2017-05-24 DIAGNOSIS — N951 Menopausal and female climacteric states: Secondary | ICD-10-CM

## 2017-05-24 MED ORDER — ELAGOLIX SODIUM 150 MG PO TABS
150.0000 mg | ORAL_TABLET | Freq: Every day | ORAL | 5 refills | Status: DC
Start: 2017-05-24 — End: 2017-10-11

## 2017-05-25 LAB — HEPATIC FUNCTION PANEL
ALK PHOS: 82 IU/L (ref 39–117)
ALT: 10 IU/L (ref 0–32)
AST: 12 IU/L (ref 0–40)
Albumin: 4.7 g/dL (ref 3.5–5.5)
BILIRUBIN, DIRECT: 0.06 mg/dL (ref 0.00–0.40)
Bilirubin Total: <0.2 mg/dL (ref 0.0–1.2)
TOTAL PROTEIN: 7.6 g/dL (ref 6.0–8.5)

## 2017-06-02 ENCOUNTER — Other Ambulatory Visit: Payer: Self-pay | Admitting: Internal Medicine

## 2017-06-03 ENCOUNTER — Telehealth: Payer: Self-pay | Admitting: Obstetrics and Gynecology

## 2017-06-03 NOTE — Telephone Encounter (Signed)
Please contact patient in follow up to a plan about treating her vasomotor symptoms with a Catapres patch.   Dr. Regis Bill is in agreement that this is an option for her. As she already has high blood pressure, I would prefer for her PCP to prescribe this for her.   She can make an appointment with Dr. Regis Bill.

## 2017-06-03 NOTE — Telephone Encounter (Signed)
-----   Message from Burnis Medin, MD sent at 05/27/2017  4:32 PM EDT ----- Regarding: RE: Catapress patch Well if her BP is not too low   Its ok with me  To try  I f needed  As long as no drug interactinos and I dont see any at this time.  She can mak appt with me if needs to discuss more.   Thanks W ----- Message ----- From: Nunzio Cobbs, MD Sent: 05/24/2017  11:10 AM To: Burnis Medin, MD Subject: Catapress patch                                Hello Dr. Regis Bill,   We share this patient in common.  I am currently treating [name] with [facility] for pelvic pain.  She is getting a good response but struggling with hot flashes related to her care.  This is causing insomnia and migraines.  She is on Effexor already.  She does not tolerate Neurontin.   I mentioned Catapres patches as an option, but would prefer to have her determine if this is appropriate for her, given her high blood pressure already.   Thank you for your input,   Josefa Half, MD [facility]

## 2017-06-06 NOTE — Progress Notes (Signed)
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 certain physical symptoms and risks.  Hormone replacement therapy may reduce some of these symptoms and risks. Talk to your health care provider about whether hormone replacement therapy is right for you. Follow these instructions at home:  Schedule regular health, dental, and eye exams.  Stay current with your immunizations.  Do not use any tobacco products including cigarettes, chewing tobacco, or electronic cigarettes.  If you are pregnant, do not drink alcohol.  If you are breastfeeding, limit how much and how often you drink alcohol.  Limit alcohol intake to no more than 1 drink per day for nonpregnant women. One drink equals 12 ounces of beer, 5 ounces of wine, or 1 ounces of hard liquor.  Do not use street drugs.  Do not share needles.  Ask your health care provider for help if you need support or information about quitting drugs.  Tell your health care provider if you often feel depressed.  Tell your health care provider if you  have ever been abused or do not feel safe at home. This information is not intended to replace advice given to you by your health care provider. Make sure you discuss any questions you have with your health care provider. Document Released: 07/27/2010 Document Revised: 06/19/2015 Document Reviewed: 10/15/2014 Elsevier Interactive Patient Education  2018 [place]. Jcion Buddenhagen M.D.

## 2017-06-06 NOTE — Telephone Encounter (Signed)
Spoke with patient, advised as seen below per Dr. Quincy Simmonds. Patient will f/u with PCP for RX, verbalizes understanding and is agreeable. Will close encounter.

## 2017-06-07 ENCOUNTER — Ambulatory Visit (INDEPENDENT_AMBULATORY_CARE_PROVIDER_SITE_OTHER): Payer: BLUE CROSS/BLUE SHIELD | Admitting: Internal Medicine

## 2017-06-07 ENCOUNTER — Encounter: Payer: Self-pay | Admitting: Internal Medicine

## 2017-06-07 VITALS — BP 122/86 | HR 88 | Temp 98.0°F | Ht 65.0 in | Wt 226.1 lb

## 2017-06-07 DIAGNOSIS — Z79899 Other long term (current) drug therapy: Secondary | ICD-10-CM

## 2017-06-07 DIAGNOSIS — R739 Hyperglycemia, unspecified: Secondary | ICD-10-CM

## 2017-06-07 DIAGNOSIS — F32A Depression, unspecified: Secondary | ICD-10-CM | POA: Insufficient documentation

## 2017-06-07 DIAGNOSIS — R51 Headache: Secondary | ICD-10-CM | POA: Diagnosis not present

## 2017-06-07 DIAGNOSIS — Z0000 Encounter for general adult medical examination without abnormal findings: Secondary | ICD-10-CM

## 2017-06-07 DIAGNOSIS — E785 Hyperlipidemia, unspecified: Secondary | ICD-10-CM

## 2017-06-07 DIAGNOSIS — G479 Sleep disorder, unspecified: Secondary | ICD-10-CM

## 2017-06-07 DIAGNOSIS — R232 Flushing: Secondary | ICD-10-CM

## 2017-06-07 DIAGNOSIS — D75839 Thrombocytosis, unspecified: Secondary | ICD-10-CM

## 2017-06-07 DIAGNOSIS — Z6839 Body mass index (BMI) 39.0-39.9, adult: Secondary | ICD-10-CM

## 2017-06-07 DIAGNOSIS — R519 Headache, unspecified: Secondary | ICD-10-CM

## 2017-06-07 DIAGNOSIS — E786 Lipoprotein deficiency: Secondary | ICD-10-CM | POA: Diagnosis not present

## 2017-06-07 DIAGNOSIS — Z8669 Personal history of other diseases of the nervous system and sense organs: Secondary | ICD-10-CM

## 2017-06-07 DIAGNOSIS — F329 Major depressive disorder, single episode, unspecified: Secondary | ICD-10-CM | POA: Insufficient documentation

## 2017-06-07 DIAGNOSIS — D473 Essential (hemorrhagic) thrombocythemia: Secondary | ICD-10-CM

## 2017-06-07 DIAGNOSIS — T50905A Adverse effect of unspecified drugs, medicaments and biological substances, initial encounter: Secondary | ICD-10-CM

## 2017-06-07 DIAGNOSIS — G8929 Other chronic pain: Secondary | ICD-10-CM

## 2017-06-07 LAB — BASIC METABOLIC PANEL
BUN: 14 mg/dL (ref 6–23)
CALCIUM: 9.5 mg/dL (ref 8.4–10.5)
CO2: 30 meq/L (ref 19–32)
CREATININE: 0.97 mg/dL (ref 0.40–1.20)
Chloride: 104 mEq/L (ref 96–112)
GFR: 79.44 mL/min (ref >60.00)
GLUCOSE: 100 mg/dL — AB (ref 70–99)
Potassium: 3.6 mEq/L (ref 3.5–5.1)
Sodium: 145 mEq/L (ref 135–145)

## 2017-06-07 LAB — LIPID PANEL
CHOLESTEROL: 266 mg/dL — AB (ref 0–200)
HDL: 48.4 mg/dL (ref >39.00)
NonHDL: 217.61
Total CHOL/HDL Ratio: 5
Triglycerides: 250 mg/dL — ABNORMAL HIGH (ref 0.0–149.0)
VLDL: 50 mg/dL — AB (ref 0.0–40.0)

## 2017-06-07 LAB — CBC WITH DIFFERENTIAL/PLATELET
BASOS ABS: 0.1 10*3/uL (ref 0.0–0.1)
Basophils Relative: 1.2 % (ref 0.0–3.0)
EOS PCT: 3.1 % (ref 0.0–5.0)
Eosinophils Absolute: 0.3 10*3/uL (ref 0.0–0.7)
HCT: 39.9 % (ref 36.0–46.0)
HEMOGLOBIN: 13.3 g/dL (ref 12.0–15.0)
LYMPHS ABS: 3.2 10*3/uL (ref 0.7–4.0)
Lymphocytes Relative: 37.7 % (ref 12.0–46.0)
MCHC: 33.4 g/dL (ref 30.0–36.0)
MCV: 91.8 fl (ref 78.0–100.0)
MONO ABS: 0.5 10*3/uL (ref 0.1–1.0)
Monocytes Relative: 6.1 % (ref 3.0–12.0)
NEUTROS PCT: 51.9 % (ref 43.0–77.0)
Neutro Abs: 4.4 10*3/uL (ref 1.4–7.7)
Platelets: 474 10*3/uL — ABNORMAL HIGH (ref 150.0–400.0)
RBC: 4.35 Mil/uL (ref 3.87–5.11)
RDW: 13.4 % (ref 11.5–15.5)
WBC: 8.4 10*3/uL (ref 4.0–10.5)

## 2017-06-07 LAB — TSH: TSH: 1.23 u[IU]/mL (ref 0.35–4.50)

## 2017-06-07 LAB — T4, FREE: Free T4: 0.55 ng/dL — ABNORMAL LOW (ref 0.60–1.60)

## 2017-06-07 LAB — LDL CHOLESTEROL, DIRECT: Direct LDL: 167 mg/dL

## 2017-06-07 LAB — HEMOGLOBIN A1C: HEMOGLOBIN A1C: 6 % (ref 4.6–6.5)

## 2017-06-07 NOTE — Patient Instructions (Signed)
Will notify you  of labs when available.  Let me reviewed about  intervention for the sleep and  Hot flushes .   consideration of ptsd  intervention  Vs  Sleep specialist because the problems al work on each other and  Have to be thought ful about  Medication interactions .  healthy weight loss I agree can only  help the siutation  Health Maintenance, Female Adopting a healthy lifestyle and getting preventive care can go a long way to promote health and wellness. Talk with your health care provider about what schedule of regular examinations is right for you. This is a good chance for you to check in with your provider about disease prevention and staying healthy. In between checkups, there are plenty of things you can do on your own. Experts have done a lot of research about which lifestyle changes and preventive measures are most likely to keep you healthy. Ask your health care provider for more information. Weight and diet Eat a healthy diet  Be sure to include plenty of vegetables, fruits, low-fat dairy products, and lean protein.  Do not eat a lot of foods high in solid fats, added sugars, or salt.  Get regular exercise. This is one of the most important things you can do for your health. ? Most adults should exercise for at least 150 minutes each week. The exercise should increase your heart rate and make you sweat (moderate-intensity exercise). ? Most adults should also do strengthening exercises at least twice a week. This is in addition to the moderate-intensity exercise.  Maintain a healthy weight  Body mass index (BMI) is a measurement that can be used to identify possible weight problems. It estimates body fat based on height and weight. Your health care provider can help determine your BMI and help you achieve or maintain a healthy weight.  For females 64 years of age and older: ? A BMI below 18.5 is considered underweight. ? A BMI of 18.5 to 24.9 is normal. ? A BMI of 25 to  29.9 is considered overweight. ? A BMI of 30 and above is considered obese.  Watch levels of cholesterol and blood lipids  You should start having your blood tested for lipids and cholesterol at 46 years of age, then have this test every 5 years.  You may need to have your cholesterol levels checked more often if: ? Your lipid or cholesterol levels are high. ? You are older than 46 years of age. ? You are at high risk for heart disease.  Cancer screening Lung Cancer  Lung cancer screening is recommended for adults 15-76 years old who are at high risk for lung cancer because of a history of smoking.  A yearly low-dose CT scan of the lungs is recommended for people who: ? Currently smoke. ? Have quit within the past 15 years. ? Have at least a 30-pack-year history of smoking. A pack year is smoking an average of one pack of cigarettes a day for 1 year.  Yearly screening should continue until it has been 15 years since you quit.  Yearly screening should stop if you develop a health problem that would prevent you from having lung cancer treatment.  Breast Cancer  Practice breast self-awareness. This means understanding how your breasts normally appear and feel.  It also means doing regular breast self-exams. Let your health care provider know about any changes, no matter how small.  If you are in your 20s or 30s, you should  have a clinical breast exam (CBE) by a health care provider every 1-3 years as part of a regular health exam.  If you are 80 or older, have a CBE every year. Also consider having a breast X-ray (mammogram) every year.  If you have a family history of breast cancer, talk to your health care provider about genetic screening.  If you are at high risk for breast cancer, talk to your health care provider about having an MRI and a mammogram every year.  Breast cancer gene (BRCA) assessment is recommended for women who have family members with BRCA-related cancers.  BRCA-related cancers include: ? Breast. ? Ovarian. ? Tubal. ? Peritoneal cancers.  Results of the assessment will determine the need for genetic counseling and BRCA1 and BRCA2 testing.  Cervical Cancer Your health care provider may recommend that you be screened regularly for cancer of the pelvic organs (ovaries, uterus, and vagina). This screening involves a pelvic examination, including checking for microscopic changes to the surface of your cervix (Pap test). You may be encouraged to have this screening done every 3 years, beginning at age 59.  For women ages 47-65, health care providers may recommend pelvic exams and Pap testing every 3 years, or they may recommend the Pap and pelvic exam, combined with testing for human papilloma virus (HPV), every 5 years. Some types of HPV increase your risk of cervical cancer. Testing for HPV may also be done on women of any age with unclear Pap test results.  Other health care providers may not recommend any screening for nonpregnant women who are considered low risk for pelvic cancer and who do not have symptoms. Ask your health care provider if a screening pelvic exam is right for you.  If you have had past treatment for cervical cancer or a condition that could lead to cancer, you need Pap tests and screening for cancer for at least 20 years after your treatment. If Pap tests have been discontinued, your risk factors (such as having a new sexual partner) need to be reassessed to determine if screening should resume. Some women have medical problems that increase the chance of getting cervical cancer. In these cases, your health care provider may recommend more frequent screening and Pap tests.  Colorectal Cancer  This type of cancer can be detected and often prevented.  Routine colorectal cancer screening usually begins at 46 years of age and continues through 46 years of age.  Your health care provider may recommend screening at an earlier age if  you have risk factors for colon cancer.  Your health care provider may also recommend using home test kits to check for hidden blood in the stool.  A small camera at the end of a tube can be used to examine your colon directly (sigmoidoscopy or colonoscopy). This is done to check for the earliest forms of colorectal cancer.  Routine screening usually begins at age 99.  Direct examination of the colon should be repeated every 5-10 years through 46 years of age. However, you may need to be screened more often if early forms of precancerous polyps or small growths are found.  Skin Cancer  Check your skin from head to toe regularly.  Tell your health care provider about any new moles or changes in moles, especially if there is a change in a mole's shape or color.  Also tell your health care provider if you have a mole that is larger than the size of a pencil eraser.  Always  use sunscreen. Apply sunscreen liberally and repeatedly throughout the day.  Protect yourself by wearing long sleeves, pants, a wide-brimmed hat, and sunglasses whenever you are outside.  Heart disease, diabetes, and high blood pressure  High blood pressure causes heart disease and increases the risk of stroke. High blood pressure is more likely to develop in: ? People who have blood pressure in the high end of the normal range (130-139/85-89 mm Hg). ? People who are overweight or obese. ? People who are African American.  If you are 18-27 years of age, have your blood pressure checked every 3-5 years. If you are 32 years of age or older, have your blood pressure checked every year. You should have your blood pressure measured twice-once when you are at a hospital or clinic, and once when you are not at a hospital or clinic. Record the average of the two measurements. To check your blood pressure when you are not at a hospital or clinic, you can use: ? An automated blood pressure machine at a pharmacy. ? A home blood  pressure monitor.  If you are between 72 years and 79 years old, ask your health care provider if you should take aspirin to prevent strokes.  Have regular diabetes screenings. This involves taking a blood sample to check your fasting blood sugar level. ? If you are at a normal weight and have a low risk for diabetes, have this test once every three years after 46 years of age. ? If you are overweight and have a high risk for diabetes, consider being tested at a younger age or more often. Preventing infection Hepatitis B  If you have a higher risk for hepatitis B, you should be screened for this virus. You are considered at high risk for hepatitis B if: ? You were born in a country where hepatitis B is common. Ask your health care provider which countries are considered high risk. ? Your parents were born in a high-risk country, and you have not been immunized against hepatitis B (hepatitis B vaccine). ? You have HIV or AIDS. ? You use needles to inject street drugs. ? You live with someone who has hepatitis B. ? You have had sex with someone who has hepatitis B. ? You get hemodialysis treatment. ? You take certain medicines for conditions, including cancer, organ transplantation, and autoimmune conditions.  Hepatitis C  Blood testing is recommended for: ? Everyone born from [birth year] through 1964. ? Anyone with known risk factors for hepatitis C.  Sexually transmitted infections (STIs)  You should be screened for sexually transmitted infections (STIs) including gonorrhea and chlamydia if: ? You are sexually active and are younger than 46 years of age. ? You are older than 46 years of age and your health care provider tells you that you are at risk for this type of infection. ? Your sexual activity has changed since you were last screened and you are at an increased risk for chlamydia or gonorrhea. Ask your health care provider if you are at risk.  If you do not have HIV, but are at risk,  it may be recommended that you take a prescription medicine daily to prevent HIV infection. This is called pre-exposure prophylaxis (PrEP). You are considered at risk if: ? You are sexually active and do not regularly use condoms or know the HIV status of your partner(s). ? You take drugs by injection. ? You are sexually active with a partner who has HIV.  Talk with your  health care provider about whether you are at high risk of being infected with HIV. If you choose to begin PrEP, you should first be tested for HIV. You should then be tested every 3 months for as long as you are taking PrEP. Pregnancy  If you are premenopausal and you may become pregnant, ask your health care provider about preconception counseling.  If you may become pregnant, take 400 to 800 micrograms (mcg) of folic acid every day.  If you want to prevent pregnancy, talk to your health care provider about birth control (contraception). Osteoporosis and menopause  Osteoporosis is a disease in which the bones lose minerals and strength with aging. This can result in serious bone fractures. Your risk for osteoporosis can be identified using a bone density scan.  If you are 52 years of age or older, or if you are at risk for osteoporosis and fractures, ask your health care provider if you should be screened.  Ask your health care provider whether you should take a calcium or vitamin D supplement to lower your risk for osteoporosis.  Menopause may have certain physical symptoms and risks.  Hormone replacement therapy may reduce some of these symptoms and risks. Talk to your health care provider about whether hormone replacement therapy is right for you. Follow these instructions at home:  Schedule regular health, dental, and eye exams.  Stay current with your immunizations.  Do not use any tobacco products including cigarettes, chewing tobacco, or electronic cigarettes.  If you are pregnant, do not drink  alcohol.  If you are breastfeeding, limit how much and how often you drink alcohol.  Limit alcohol intake to no more than 1 drink per day for nonpregnant women. One drink equals 12 ounces of beer, 5 ounces of wine, or 1 ounces of hard liquor.  Do not use street drugs.  Do not share needles.  Ask your health care provider for help if you need support or information about quitting drugs.  Tell your health care provider if you often feel depressed.  Tell your health care provider if you have ever been abused or do not feel safe at home. This information is not intended to replace advice given to you by your health care provider. Make sure you discuss any questions you have with your health care provider. Document Released: 07/27/2010 Document Revised: 06/19/2015 Document Reviewed: 10/15/2014 Elsevier Interactive Patient Education  [employer].

## 2017-06-19 ENCOUNTER — Encounter (HOSPITAL_COMMUNITY): Payer: Self-pay | Admitting: Emergency Medicine

## 2017-06-19 ENCOUNTER — Emergency Department (HOSPITAL_COMMUNITY)
Admission: EM | Admit: 2017-06-19 | Discharge: 2017-06-19 | Disposition: A | Discharge status: Home / Self Care | Payer: BLUE CROSS/BLUE SHIELD | Attending: Emergency Medicine | Admitting: Emergency Medicine

## 2017-06-19 DIAGNOSIS — Z79899 Other long term (current) drug therapy: Secondary | ICD-10-CM | POA: Insufficient documentation

## 2017-06-19 DIAGNOSIS — I10 Essential (primary) hypertension: Secondary | ICD-10-CM | POA: Diagnosis not present

## 2017-06-19 DIAGNOSIS — R519 Headache, unspecified: Secondary | ICD-10-CM

## 2017-06-19 DIAGNOSIS — R51 Headache: Secondary | ICD-10-CM | POA: Insufficient documentation

## 2017-06-19 DIAGNOSIS — G43919 Migraine, unspecified, intractable, without status migrainosus: Secondary | ICD-10-CM | POA: Diagnosis present

## 2017-06-19 MED ORDER — METOCLOPRAMIDE HCL 5 MG/ML IJ SOLN
10.0000 mg | Freq: Once | INTRAMUSCULAR | Status: AC
  Administered 2017-06-19: 10 mg via INTRAVENOUS
  Filled 2017-06-19: qty 2

## 2017-06-19 MED ORDER — DIPHENHYDRAMINE HCL 50 MG/ML IJ SOLN
25.0000 mg | Freq: Once | INTRAMUSCULAR | Status: AC
  Administered 2017-06-19: 25 mg via INTRAVENOUS
  Filled 2017-06-19: qty 1

## 2017-06-19 MED ORDER — KETOROLAC TROMETHAMINE 30 MG/ML IJ SOLN
30.0000 mg | Freq: Once | INTRAMUSCULAR | Status: AC
  Administered 2017-06-19: 30 mg via INTRAVENOUS
  Filled 2017-06-19: qty 1

## 2017-06-19 MED ORDER — SODIUM CHLORIDE 0.9 % IV BOLUS
1000.0000 mL | Freq: Once | INTRAVENOUS | Status: AC
  Administered 2017-06-19: 1000 mL via INTRAVENOUS

## 2017-06-19 MED ORDER — MAGNESIUM SULFATE 2 GM/50ML IV SOLN
2.0000 g | Freq: Once | INTRAVENOUS | Status: AC
  Administered 2017-06-19: 2 g via INTRAVENOUS
  Filled 2017-06-19: qty 50

## 2017-06-19 MED ORDER — DEXAMETHASONE SODIUM PHOSPHATE 10 MG/ML IJ SOLN
10.0000 mg | Freq: Once | INTRAMUSCULAR | Status: AC
  Administered 2017-06-19: 10 mg via INTRAVENOUS
  Filled 2017-06-19: qty 1

## 2017-06-19 MED ORDER — PROCHLORPERAZINE EDISYLATE 10 MG/2ML IJ SOLN
10.0000 mg | Freq: Once | INTRAMUSCULAR | Status: AC
  Administered 2017-06-19: 10 mg via INTRAVENOUS
  Filled 2017-06-19: qty 2

## 2017-06-19 MED ORDER — HALOPERIDOL LACTATE 5 MG/ML IJ SOLN
2.0000 mg | Freq: Once | INTRAMUSCULAR | Status: AC
  Administered 2017-06-19: 2 mg via INTRAVENOUS
  Filled 2017-06-19: qty 1

## 2017-06-19 NOTE — ED Notes (Signed)
ED Provider at bedside. 

## 2017-06-19 NOTE — ED Provider Notes (Signed)
[facility] DEPT Provider Note   CSN: [id number] Arrival date & time: 06/19/17  1406     History   Chief Complaint Chief Complaint  Patient presents with  . Migraine    HPI  BABULA is a 47 y.o. female with history of anemia, anxiety, IBS, HTN, HLD, chronic migraines presents today for evaluation of acute worsening of chronic migraines for the past 11 days.  She states that she has had a migraine daily constantly for the past 2 years but it waxes and wanes.  She states that sometimes she will rated as a 2/10 in severity and other times it will worsen.  She states that for the past 11 days her headache has been an 8/10 in severity constantly.  She notes associated photophobia, phonophobia, and nausea.  She denies vomiting.  She has chronic left-sided paresthesias which have been present for 10 years and have not worsened.  She denies any recent trauma or falls.  She states her current symptoms are consistent with her usual migraine headache but is just more severe persistently.  She has tried her home baclofen, Aimovig, and Phenergan without significant relief of her symptoms.  She called her neurologist office 2 days ago and they prescribed oral Compazine and almotriptan but she states that she did not want to take the Compazine because she was already taking Phenergan and she was hesitant to start the almotriptan as she has had adverse reactions with sumatriptan.  Headache is left-sided, throbbing in nature and will radiate to the occipital region at times.  She does follow with a neurologist.   The history is provided by the patient.    Past Medical History:  Diagnosis Date  . Allergic rhinitis    hx of testing and allergic to spring and fall f= pollens  . Anemia    nos  . Anxiety   . Chest pain    neg stress test summer 2010  . Endometriosis   . Endometriosis   . Fibroids   . GERD (gastroesophageal reflux disease)   . Goiter   .  Headache(784.0)   . 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 Hyperlipidemia   . Hypertension   . Irritable bowel syndrome   . Labial cyst 04/08/2011   early infection use hotpcompresses    . Migraines   . Ovarian torsion   . Sinusitis   . STD (sexually transmitted disease)    Pos. Chlamydia--in her 60's  . Thyroid disease    goiter    Patient Active Problem List   Diagnosis Date Noted  . Depression 06/07/2017  . Bilateral carpal tunnel syndrome 01/20/2017  . Left sided numbness 01/20/2017  . Morbid obesity ([place]) 01/30/2015  . 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 RUQ pain 02/25/2014  . Diarrhea 02/25/2014  . Calcium oxalate crystals in urine 02/22/2014  . Intramural leiomyoma of uterus 02/22/2014  . Hx of migraine headaches 02/04/2014  . Medication management 02/04/2014  . Hx of endometriosis 02/04/2014  . Right lower quadrant abdominal pain mid also 02/04/2014  . Adjustment disorder with anxious mood 01/17/2014  . Thyroid mass 02/16/2013  . Fever, unspecified 02/15/2013  . Sinusitis 02/15/2013  . Anxiety 12/11/2011  . Stress 12/11/2011  . Irregular periods 08/17/2011  . Endometriosis 08/17/2011  . Edema 05/10/2011  . Elevated blood pressure reading 05/10/2011  . Fatigue 04/08/2011  . Upper respiratory tract infection 04/08/2011  .  SINUSITIS, RECURRENT 11/06/2009  . FATIGUE 02/25/2009  . PALPITATIONS 02/25/2009  . DYSPNEA 02/25/2009  . KNEE INJURY, RIGHT 12/05/2007  . HEADACHE 05/03/2007  . ADULT SITUATIONAL REACTION 11/21/2006  . SLEEPLESSNESS 11/21/2006  . CHEST PAIN 11/21/2006  . TINEA PEDIS 08/31/2006  . Other and unspecified hyperlipidemia 08/31/2006  . DISORDER, ADJUSTMENT W/ANXIETY 08/31/2006  . PLANTAR FASCIITIS 08/31/2006  . ANEMIA-NOS 06/23/2006  . MIGRAINE, COMMON 06/23/2006  . ALLERGIC RHINITIS 06/23/2006  . GERD 06/23/2006  . IRRITABLE BOWEL SYNDROME 06/23/2006     Past Surgical History:  Procedure Laterality Date  . CESAREAN SECTION  2002, 2004   x2  . PELVIC LAPAROSCOPY     x9  . RADIOLOGY WITH ANESTHESIA N/A 09/05/2014   Procedure: MRI OF BRAIN WITHOUT CONTRAST;  Surgeon: Medication Radiologist, MD;  Location: [facility];  Service: Radiology;  Laterality: N/A;  MRI/DR. JAFFE  . TUBAL LIGATION    . UNILATERAL SALPINGECTOMY Right 1995   ectopic pregnancy     OB History    Gravida  7   Para  2   Term      Preterm      AB  5   Living  2     SAB  1   TAB  1   Ectopic  3   Multiple      Live Births           Obstetric Comments  3 ectopic         Home Medications    Prior to Admission medications   Medication Sig Start Date End Date Taking? Authorizing Provider  AIMOVIG 140 DOSE 70 MG/ML SOAJ Inject 2 mLs (140 mg total) into the skin every 30 days for 30 days 05/02/17  Yes [provider]  baclofen (LIORESAL) 10 MG tablet take ONE-HALF to 1 TAB AS NEEDED to TWICE DAILY, headache. Limit to 2 headache days per week. Avoid daily use. 01/06/16  Yes [provider]  Elagolix Sodium (ORILISSA) 150 MG TABS Take 150 mg by mouth daily. 05/24/17  Yes Nunzio Cobbs, MD  Multiple Vitamin (MULTIVITAMIN WITH MINERALS) TABS tablet Take 1 tablet by mouth daily.   Yes [provider]  pantoprazole (PROTONIX) 40 MG tablet Take 1 tablet (40 mg total) by mouth daily. 04/07/17  Yes Panosh, Standley Brooking, MD  promethazine (PHENERGAN) 25 MG tablet Take 1 tablet (25 mg total) by mouth every 6 (six) hours as needed for nausea or vomiting. 07/08/15  Yes Tomi Likens, Adam R, DO  triamterene-hydrochlorothiazide (MAXZIDE-25) 37.5-25 MG tablet Take 1 tablet by mouth daily. 03/02/17  Yes Panosh, Standley Brooking, MD  venlafaxine XR (EFFEXOR-XR) 150 MG 24 hr capsule Take 1 capsule (150 mg total) by mouth daily with breakfast. 03/02/17  Yes Panosh, Standley Brooking, MD  zonisamide (ZONEGRAN) 100 MG capsule Take 400 mg by mouth at bedtime.  04/08/16  Yes  [provider]    Family History Family History  Problem Relation Age of Onset  . Anxiety disorder Mother        mother is on xanax and apparently applying for  social security diabilty for her anxiety. t  . Depression Mother   . Diabetes Mother   . Hypertension Mother   . Heart attack Father   . Seizures Father   . Stroke Maternal Grandmother   . Diabetes Paternal Grandmother   . Hypertension Paternal Grandmother   . Coronary artery disease Unknown   . Diabetes Unknown   . Hyperlipidemia Unknown   .  Other Unknown        history of child death in family   . Colon cancer Neg Hx   . Stomach cancer Neg Hx   . Esophageal cancer Neg Hx     Social History Social History   Tobacco Use  . Smoking status: Never Smoker  . Smokeless tobacco: Never Used  Substance Use Topics  . Alcohol use: No    Alcohol/week: 0.0 oz  . Drug use: No     Allergies   Codeine phosphate; Moxifloxacin; Sulfamethoxazole; and Sumatriptan succinate   Review of Systems Review of Systems  Constitutional: Negative for chills and fever.  Eyes: Positive for photophobia. Negative for visual disturbance.  Respiratory: Negative for shortness of breath.   Cardiovascular: Negative for chest pain.  Gastrointestinal: Positive for nausea. Negative for abdominal pain and vomiting.  Neurological: Positive for numbness (chronic, unchanged) and headaches. Negative for syncope and weakness.  All other systems reviewed and are negative.    Physical Exam Updated Vital Signs BP 140/81   Pulse 71   Temp (!) 97.5 F (36.4 C) (Oral)   Resp 20   SpO2 100%   Physical Exam  Constitutional: She is oriented to person, place, and time. She appears well-developed and well-nourished. No distress.  HENT:  Head: Normocephalic and atraumatic.  Eyes: Conjunctivae are normal. Right eye exhibits no discharge. Left eye exhibits no discharge.  Neck: Normal range of motion and full passive range of motion without  pain. Neck supple. No JVD present. No tracheal deviation present. No Brudzinski's sign and no Kernig's sign noted.  Cardiovascular: Normal rate, regular rhythm and normal heart sounds.  Pulmonary/Chest: Effort normal and breath sounds normal. She exhibits no tenderness.  Abdominal: Soft. Bowel sounds are normal. She exhibits no distension. There is no tenderness. There is no guarding.  Musculoskeletal: She exhibits no edema.  5/5 strength of BUE and BLE major muscle groups. No midline spine TTP, no paraspinal muscle tenderness, no deformity, crepitus, or step-off noted   Neurological: She is alert and oriented to person, place, and time. No cranial nerve deficit or sensory deficit. She exhibits normal muscle tone.  Mental Status:  Alert, thought content appropriate, able to give a coherent history. Speech fluent without evidence of aphasia. Able to follow 2 step commands without difficulty.  Cranial Nerves:  II:  Peripheral visual fields grossly normal, pupils equal, round, reactive to light III,IV, VI: ptosis not present, extra-ocular motions intact bilaterally  V,VII: smile symmetric, facial light touch sensation equal VIII: hearing grossly normal to voice  X: uvula elevates symmetrically  XI: bilateral shoulder shrug symmetric and strong XII: midline tongue extension without fassiculations Motor:  Normal tone. 5/5 strength of BUE and BLE major muscle groups including strong and equal grip strength and dorsiflexion/plantar flexion Sensory: Decreased sensation of the left upper and lower extremities.  Patient states this is chronic and unchanged. Cerebellar: normal finger-to-nose with bilateral upper extremities Gait: normal gait and balance. Able to walk on toes and heels with ease.  Skin: Skin is warm and dry. No erythema.  Psychiatric: She has a normal mood and affect. Her behavior is normal.  Nursing note and vitals reviewed.    ED Treatments / Results  Labs (all labs ordered are  listed, but only abnormal results are displayed) Labs Reviewed - No data to display  EKG EKG Interpretation  Date/Time:  Sunday Jun 19 2017 20:37:48 EDT Ventricular Rate:  71 PR Interval:    QRS Duration: 98 QT Interval:  449 QTC Calculation: 488 R Axis:   50 Text Interpretation:  Sinus rhythm Borderline prolonged PR interval Borderline prolonged QT interval No STEMI.  Confirmed by Nanda Quinton [phone]) on 06/19/2017 8:41:42 PM   Radiology No results found.  Procedures Procedures (including critical care time)  Medications Ordered in ED Medications  sodium chloride 0.9 % bolus 1,000 mL (0 mLs Intravenous Stopped 06/19/17 2025)  ketorolac (TORADOL) 30 MG/ML injection 30 mg (30 mg Intravenous Given 06/19/17 1828)  prochlorperazine (COMPAZINE) injection 10 mg (10 mg Intravenous Given 06/19/17 1828)  diphenhydrAMINE (BENADRYL) injection 25 mg (25 mg Intravenous Given 06/19/17 1828)  magnesium sulfate IVPB 2 g 50 mL (0 g Intravenous Stopped 06/19/17 1940)  haloperidol lactate (HALDOL) injection 2 mg (2 mg Intravenous Given 06/19/17 1926)  metoCLOPramide (REGLAN) injection 10 mg (10 mg Intravenous Given 06/19/17 2036)  dexamethasone (DECADRON) injection 10 mg (10 mg Intravenous Given 06/19/17 2036)     Initial Impression / Assessment and Plan / ED Course  I have reviewed the triage vital signs and the nursing notes.  Pertinent labs & imaging results that were available during my care of the patient were reviewed by me and considered in my medical decision making (see chart for details).     Patient presents for evaluation of worsening of her chronic migraine.  She states that it has been constant 8/10 in severity for the past 11 days.  She is afebrile, vital signs are stable.  No new focal neurologic deficits.  She has chronic left-sided paresthesias for the past 10 years and states this is unchanged.  She is ambulatory without difficulty.  No meningeal signs or recent history of injury.  I  doubt SAH, ICH, meningitis, or CVA.  She has had relief with Toradol, Compazine, and Benadryl, and IV fluids in the past so we will try this.  On reevaluation, the patient notes no improvement in her symptoms.  We will give Haldol and magnesium.  Patient did not tolerate these medications so they were discontinued.  She was offered Reglan and Decadron as well as a Sphenopalatine ganglion block.  She accepted the former but not the latter. She states she wishes to go home, notes minimal improvement in her headache and states "I will call my neurologist and try to manage this on my own at home".  She is ambulatory with steady gait and remains in no apparent distress.  I encouraged the patient to stay longer for alternative treatments but she declines.  She was given Reglan and Decadron.  She states she will call her neurologist and request further management of her migraine.  Recommend follow-up with her neurologist on an outpatient basis.  Discussed strict ED return precautions. Pt and patient's son verbalized understanding of and agreement with plan and is safe for discharge home at this time.  Patient seen and evaluated by Dr. Laverta Baltimore who agrees with assessment and plan at this time.  Final Clinical Impressions(s) / ED Diagnoses   Final diagnoses:  Bad headache    ED Discharge Orders    None      Debroah Baller 06/20/17 0024  Margette Fast, MD 06/20/17 [phone]

## 2017-06-19 NOTE — ED Notes (Addendum)
Patient states "I am coming out of my skin" after haldol and magnesium sulfate administration. Reports feeling restless. Stopped mag sulfate and made PA aware.

## 2017-06-19 NOTE — Discharge Instructions (Signed)
Continue taking your home medications as prescribed.  Drink plenty of water and get plenty of rest.  Call your neurologist for further recommendations and follow-up with them in the office for reevaluation.  Return to the emergency department if any concerning signs or symptoms develop such as high fevers, slurred speech, facial droop, passing out, or weakness.

## 2017-06-19 NOTE — ED Triage Notes (Signed)
Pt c/o migraine for 11 days that has been constant. Reports mainly on left side. With some nausea denies vomiting.

## 2017-06-24 ENCOUNTER — Encounter: Payer: Self-pay | Admitting: Internal Medicine

## 2017-06-24 ENCOUNTER — Other Ambulatory Visit: Payer: Self-pay | Admitting: Internal Medicine

## 2017-06-24 DIAGNOSIS — E785 Hyperlipidemia, unspecified: Secondary | ICD-10-CM

## 2017-06-24 DIAGNOSIS — R7989 Other specified abnormal findings of blood chemistry: Secondary | ICD-10-CM

## 2017-06-24 MED ORDER — CLONIDINE 0.1 MG/24HR TD PTWK: 0.1000 mg | MEDICATED_PATCH | TRANSDERMAL | 1 refills | Status: DC

## 2017-06-24 NOTE — Telephone Encounter (Signed)
At this  Point     Lifestyle intervention  Until thyroid is ok   Please see below   I sent   From result note     Notes recorded by Burnis Medin, MD on 06/24/2017 at 3:32 PM EDT As you may have seen the results  Your thyroid is borderline off  cholesterol Is still too  High  Rest ok .  We can try the low dose clonidine patch  I can send in To your pharmacy It is a low dose and may cause  Some drowsiness That usually mitigates With time and  We can increase  As Tolerated .  I will send in a months worth of patches  And have you get back with me in 3-4 weeks ( can send in e mail  About flushes and bp readings) And go from there   Please  Have her repeat tsh and free t4  And thyroid antibody panel  Dx Low t4

## 2017-06-24 NOTE — Telephone Encounter (Signed)
Please advise Dr Panosh, thanks.   

## 2017-06-27 NOTE — Telephone Encounter (Signed)
Order placed for labs, TSH/Thyoid labs to be drawn in 3-4 weeks and Lipid to be drawn in 4-6 months. Pt aware. Nothing further needed.

## 2017-06-29 ENCOUNTER — Encounter: Payer: Self-pay | Admitting: Internal Medicine

## 2017-06-30 NOTE — Telephone Encounter (Signed)
Please advise Dr Regis Bill, thanks.   Most recent OV 06/07/17 Instructions Return for depending on labs and plan  after reveiw .   Will notify you  of labs when available.  Let me reviewed about  intervention for the sleep and  Hot flushes .   consideration of ptsd  intervention  Vs  Sleep specialist because the problems al work on each other and  Have to be thought ful about  Medication interactions .  healthy weight loss I agree can only  help the siutation

## 2017-07-06 ENCOUNTER — Other Ambulatory Visit: Payer: Self-pay | Admitting: Internal Medicine

## 2017-07-06 ENCOUNTER — Encounter: Payer: Self-pay | Admitting: Internal Medicine

## 2017-07-06 MED ORDER — PANTOPRAZOLE SODIUM 40 MG PO TBEC: 40.0000 mg | DELAYED_RELEASE_TABLET | Freq: Every day | ORAL | 0 refills | Status: DC

## 2017-07-06 MED ORDER — VENLAFAXINE HCL ER 150 MG PO CP24: 150.0000 mg | ORAL_CAPSULE | Freq: Every day | ORAL | 0 refills | Status: DC

## 2017-07-06 MED ORDER — TRIAMTERENE-HCTZ 37.5-25 MG PO TABS: 1.0000 | ORAL_TABLET | Freq: Every day | ORAL | 0 refills | Status: DC

## 2017-07-06 NOTE — Telephone Encounter (Signed)
Medications filled to pharmacy as requested. Please see patient's other MyChart message and advise.

## 2017-07-08 NOTE — Telephone Encounter (Signed)
I was hoping that the clonidine patch  And  Flush supprssion  increase in dosing   may help your sleep but   May not  Be enough to help    we can do  a referral to   Dr Beacher May  But she is neurology  And sleep specialist    I see you have an appt with   Another neurologist    And    You should  have them address  Sleep They may not be sleep specialist  But may be able to direct to get help .    Let me know how th flushes are doing on the clonidine and we may increase dose    apologize about the delayed relying  With these messages

## 2017-07-18 ENCOUNTER — Encounter: Payer: Self-pay | Admitting: Internal Medicine

## 2017-07-18 DIAGNOSIS — R51 Headache: Principal | ICD-10-CM

## 2017-07-18 DIAGNOSIS — R519 Headache, unspecified: Secondary | ICD-10-CM

## 2017-07-18 DIAGNOSIS — Z8669 Personal history of other diseases of the nervous system and sense organs: Secondary | ICD-10-CM

## 2017-07-19 NOTE — Telephone Encounter (Signed)
Please advise Dr Panosh, thanks.   

## 2017-07-21 ENCOUNTER — Other Ambulatory Visit (INDEPENDENT_AMBULATORY_CARE_PROVIDER_SITE_OTHER): Payer: BLUE CROSS/BLUE SHIELD

## 2017-07-21 DIAGNOSIS — R7989 Other specified abnormal findings of blood chemistry: Secondary | ICD-10-CM

## 2017-07-21 DIAGNOSIS — E785 Hyperlipidemia, unspecified: Secondary | ICD-10-CM | POA: Diagnosis not present

## 2017-07-21 LAB — LIPID PANEL
CHOL/HDL RATIO: 6
Cholesterol: 264 mg/dL — ABNORMAL HIGH (ref 0–200)
HDL: 42.7 mg/dL (ref >39.00)
LDL Cholesterol: 192 mg/dL — ABNORMAL HIGH (ref 0–99)
NonHDL: 221.7
TRIGLYCERIDES: 147 mg/dL (ref 0.0–149.0)
VLDL: 29.4 mg/dL (ref 0.0–40.0)

## 2017-07-21 LAB — T4, FREE: FREE T4: 0.75 ng/dL (ref 0.60–1.60)

## 2017-07-21 LAB — TSH: TSH: 1.4 u[IU]/mL (ref 0.35–4.50)

## 2017-07-21 NOTE — Telephone Encounter (Signed)
Patient hasn't received a call back and is inquiring about her refill on her patches.  Patient is requesting a call back.

## 2017-07-22 ENCOUNTER — Encounter: Payer: Self-pay | Admitting: Family Medicine

## 2017-07-22 ENCOUNTER — Ambulatory Visit (INDEPENDENT_AMBULATORY_CARE_PROVIDER_SITE_OTHER): Payer: BLUE CROSS/BLUE SHIELD | Admitting: Family Medicine

## 2017-07-22 VITALS — BP 118/76 | HR 80 | Temp 98.6°F | Wt 227.0 lb

## 2017-07-22 DIAGNOSIS — J40 Bronchitis, not specified as acute or chronic: Secondary | ICD-10-CM | POA: Diagnosis not present

## 2017-07-22 LAB — THYROID PEROXIDASE ANTIBODY: Thyroperoxidase Ab SerPl-aCnc: 36 IU/mL — ABNORMAL HIGH (ref <9)

## 2017-07-22 MED ORDER — AZITHROMYCIN 250 MG PO TABS: ORAL_TABLET | ORAL | 0 refills | Status: DC

## 2017-07-22 MED ORDER — CLONIDINE 0.2 MG/24HR TD PTWK: 0.2000 mg | MEDICATED_PATCH | TRANSDERMAL | 2 refills | Status: DC

## 2017-07-22 MED ORDER — FLUTICASONE PROPIONATE 50 MCG/ACT NA SUSP: 1.0000 | Freq: Every day | NASAL | 0 refills | Status: DC

## 2017-07-22 NOTE — Telephone Encounter (Signed)
Referral placed and will be handled on Monday.  Nothing further needed.

## 2017-07-22 NOTE — Telephone Encounter (Signed)
Message sent to patient

## 2017-07-22 NOTE — Progress Notes (Signed)
Subjective:    Patient ID: [name], female    DOB: 01/20/1971, 47 y.o.   MRN: [id number]  No chief complaint on file.   HPI Patient was seen today for ongoing concern.  Pt endorses continued respiratory symptoms since 06/07/17.  Pt endorses cough, chest congestion/tightness, soreness in ribs with coughing, rhinorrhea, scratchy throat when symptoms started.  Pt denies fever, chills, N/V.  Pt endorses hot flashes and migraines at baseline.  Pt has not tried anything for her symptoms   Past Medical History:  Diagnosis Date  . Allergic rhinitis    hx of testing and allergic to spring and fall f= pollens  . Anemia    nos  . Anxiety   . Chest pain    neg stress test summer 2010  . Endometriosis   . Endometriosis   . Fibroids   . GERD (gastroesophageal reflux disease)   . Goiter   . Headache(784.0)   . 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 Hyperlipidemia   . Hypertension   . Irritable bowel syndrome   . Labial cyst 04/08/2011   early infection use hotpcompresses    . Migraines   . Ovarian torsion   . Sinusitis   . STD (sexually transmitted disease)    Pos. Chlamydia--in her 44's  . Thyroid disease    goiter    Allergies  Allergen Reactions  . Codeine Phosphate Hives  . Moxifloxacin     REACTION: cause strange sensation, joint pain. Could not get thumbs to move. Stiffness.  . Sulfamethoxazole Hives  . Sumatriptan Succinate Palpitations    ROS General: Denies fever, chills, night sweats, changes in weight, changes in appetite HEENT: Denies headaches, ear pain, changes in vision, rhinorrhea, sore throat  + scratchy throat, rhinorrhea CV: Denies CP, palpitations, SOB, orthopnea Pulm: Denies SOB, wheezing  + cough, chest congestion/tightness GI: Denies abdominal pain, nausea, vomiting, diarrhea, constipation GU: Denies dysuria, hematuria, frequency, vaginal discharge Msk: Denies muscle cramps, joint pains  + rib soreness  with coughing Neuro: Denies weakness, numbness, tingling Skin: Denies rashes, bruising Psych: Denies depression, anxiety, hallucinations     Objective:    Blood pressure 118/76, pulse 80, temperature 98.6 F (37 C), temperature source Oral, weight 227 lb (103 kg), SpO2 98 %.   Gen. Pleasant, well-nourished, in no distress, normal affect  HEENT: /AT, face symmetric, no scleral icterus, PERRLA, nares patent without drainage, pharynx without erythema or exudate.  TMs normal bilaterally.  No cervical lymphadenopathy. Lungs: Cough, no accessory muscle use, CTAB, no wheezes or rales Cardiovascular: RRR, no m/r/g, no peripheral edema Musculoskeletal: No deformities, no cyanosis or clubbing, normal tone.  No TTP of rib cage Neuro:  A&Ox3, CN II-XII intact, normal gait   Wt Readings from Last 3 Encounters:  07/22/17 227 lb (103 kg)  06/07/17 226 lb 1.6 oz (102.6 kg)  05/24/17 225 lb (102.1 kg)    Lab Results  Component Value Date   WBC 8.4 06/07/2017   HGB 13.3 06/07/2017   HCT 39.9 06/07/2017   PLT 474.0 (H) 06/07/2017   GLUCOSE 100 (H) 06/07/2017   CHOL 264 (H) 07/21/2017   TRIG 147.0 07/21/2017   HDL 42.70 07/21/2017   LDLDIRECT 167.0 06/07/2017   LDLCALC 192 (H) 07/21/2017   ALT 10 05/24/2017   AST 12 05/24/2017   NA 145 06/07/2017   K 3.6 06/07/2017   CL 104 06/07/2017   CREATININE 0.97 06/07/2017  BUN 14 06/07/2017   CO2 30 06/07/2017   TSH 1.40 07/21/2017   HGBA1C 6.0 06/07/2017    Assessment/Plan:  Bronchitis  -tylenol for pain/discomfort.   -given handout - Plan: azithromycin (ZITHROMAX) 250 MG tablet, fluticasone (FLONASE) 50 MCG/ACT nasal spray -f/u prn with pcp  Grier Mitts, MD

## 2017-07-22 NOTE — Telephone Encounter (Signed)
I sent in clonidine 0.2 patch   Let us know if helpful at all and ok to refer to  Neuro of her choice since she  Still having a tough time with this.

## 2017-07-22 NOTE — Patient Instructions (Signed)

## 2017-07-24 ENCOUNTER — Encounter: Payer: Self-pay | Admitting: Internal Medicine

## 2017-07-27 NOTE — Telephone Encounter (Signed)
Please send in order placed for lipitor  But tell patient   Warning to not take if pregnant  And    Make sure we recheck lipid panel in 3 months on meds

## 2017-07-28 ENCOUNTER — Telehealth: Payer: Self-pay | Admitting: Gynecology

## 2017-07-28 MED ORDER — MEGESTROL ACETATE 20 MG PO TABS: 20.0000 mg | ORAL_TABLET | Freq: Three times a day (TID) | ORAL | 0 refills | Status: DC

## 2017-07-28 NOTE — Telephone Encounter (Signed)
On-call note: Patient calls with bleeding starting 4 to 5 days ago heavy with clots.  Thought that it was her normal menses but the heaviness has continued.  Is on Orlissa for endometriosis and leiomyoma.  History of leiomyoma with ultrasound 01/2017 showing 4.5 cm myoma.  Most recent hemoglobin in her chart 05/2017 13.3.  Recommended Megace 20 mg 3 times daily until bleeding slows then twice daily.  To call office and schedule a follow-up appointment to discuss longer-term management plan.

## 2017-07-29 ENCOUNTER — Telehealth: Payer: Self-pay | Admitting: Obstetrics and Gynecology

## 2017-07-29 ENCOUNTER — Other Ambulatory Visit: Payer: Self-pay | Admitting: *Deleted

## 2017-07-29 DIAGNOSIS — R7989 Other specified abnormal findings of blood chemistry: Secondary | ICD-10-CM

## 2017-07-29 DIAGNOSIS — E785 Hyperlipidemia, unspecified: Secondary | ICD-10-CM

## 2017-07-29 MED ORDER — ATORVASTATIN CALCIUM 10 MG PO TABS: 10.0000 mg | ORAL_TABLET | Freq: Every day | ORAL | 3 refills | Status: DC

## 2017-07-29 MED ORDER — ATORVASTATIN CALCIUM 10 MG PO TABS: 10.0000 mg | ORAL_TABLET | Freq: Every day | ORAL | 2 refills | Status: DC

## 2017-07-29 NOTE — Progress Notes (Signed)
Atorvastatin rx initially sent to [facility], but patient requested [facility]. Medication filled to correct pharmacy as requested and canceled at [facility].

## 2017-07-29 NOTE — Addendum Note (Signed)
Addended by: Dorrene German on: 07/29/2017 10:47 AM   Modules accepted: Orders

## 2017-07-29 NOTE — Telephone Encounter (Signed)
I agree with these recommendations.  I will see her on Monday, July 8.

## 2017-07-29 NOTE — Telephone Encounter (Signed)
Call to patient. Left message to call back to triage nurse.  

## 2017-07-29 NOTE — Telephone Encounter (Signed)
Left detailed message on patient's voicemail, okay per ROI, to keep appointment as scheduled for Monday 7/8 and to continue Megace until seen. Encounter closed.

## 2017-07-29 NOTE — Addendum Note (Signed)
Addended by: Dorrene German on: 07/29/2017 10:57 AM   Modules accepted: Orders

## 2017-07-29 NOTE — Telephone Encounter (Signed)
Spoke with patient. Patient states that she spoke with Dr.Fontaine yesterday due to heavy bleeding that started 4 to 5 with clots.  Thought that it was her normal menses, but the bleeding continued to be heavy. Is on Orlissa for endometriosis and leiomyoma.  Dr.Fontaine recommended Megace 20 mg 3 times daily until bleeding slows then twice daily. Patient states that bleeding has slowed and is only using a panty liner now that she is changing rarely. Advised will need to be seen in the office for follow up. Appointment scheduled for 08/01/2017 at 4 pm with Dr.Silva. Patient declines morning appointment. Advised will review with Dr.Silva and return call with any further recommendations.

## 2017-07-29 NOTE — Telephone Encounter (Signed)
Patient called requesting to speak with the nurse. She said she recently called Dr. Phineas Real after hours due to irregular bleeding. She further explained that he prescribed her some medications to stop the bleeding and told her to call our office to follow up.  Patient said she is doing better with regards to the bleeding.  Last seen: 05/24/17

## 2017-08-01 ENCOUNTER — Other Ambulatory Visit: Payer: Self-pay

## 2017-08-01 ENCOUNTER — Encounter: Payer: Self-pay | Admitting: Obstetrics and Gynecology

## 2017-08-01 ENCOUNTER — Ambulatory Visit (INDEPENDENT_AMBULATORY_CARE_PROVIDER_SITE_OTHER): Payer: BLUE CROSS/BLUE SHIELD | Admitting: Obstetrics and Gynecology

## 2017-08-01 VITALS — BP 114/60 | HR 76 | Resp 16 | Ht 65.5 in | Wt 225.0 lb

## 2017-08-01 DIAGNOSIS — N939 Abnormal uterine and vaginal bleeding, unspecified: Secondary | ICD-10-CM | POA: Diagnosis not present

## 2017-08-01 DIAGNOSIS — R8290 Unspecified abnormal findings in urine: Secondary | ICD-10-CM

## 2017-08-01 DIAGNOSIS — N946 Dysmenorrhea, unspecified: Secondary | ICD-10-CM

## 2017-08-01 LAB — POCT URINALYSIS DIPSTICK
BILIRUBIN UA: NEGATIVE
GLUCOSE UA: NEGATIVE
Ketones, UA: NEGATIVE
Nitrite, UA: NEGATIVE
PH UA: 5 (ref 5.0–8.0)
Protein, UA: NEGATIVE
Urobilinogen, UA: 0.2 E.U./dL

## 2017-08-01 LAB — POCT URINE PREGNANCY: PREG TEST UR: NEGATIVE

## 2017-08-01 NOTE — Progress Notes (Deleted)
GYNECOLOGY  VISIT   HPI: 47 y.o.   Single  Caucasian  female   [id number] with No LMP recorded. (Menstrual status: IUD). here for     GYNECOLOGIC HISTORY: No LMP recorded. (Menstrual status: IUD). Contraception:  *** Menopausal hormone therapy:  *** Last mammogram:  *** Last pap smear:   ***        OB History    Gravida  7   Para  2   Term      Preterm      AB  5   Living  2     SAB  1   TAB  1   Ectopic  3   Multiple      Live Births           Obstetric Comments  3 ectopic           Patient Active Problem List   Diagnosis Date Noted  . Depression 06/07/2017  . Bilateral carpal tunnel syndrome 01/20/2017  . Left sided numbness 01/20/2017  . Morbid obesity ([place]) 01/30/2015  . 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 RUQ pain 02/25/2014  . Diarrhea 02/25/2014  . Calcium oxalate crystals in urine 02/22/2014  . Intramural leiomyoma of uterus 02/22/2014  . Hx of migraine headaches 02/04/2014  . Medication management 02/04/2014  . Hx of endometriosis 02/04/2014  . Right lower quadrant abdominal pain mid also 02/04/2014  . Adjustment disorder with anxious mood 01/17/2014  . Thyroid mass 02/16/2013  . Fever, unspecified 02/15/2013  . Sinusitis 02/15/2013  . Anxiety 12/11/2011  . Stress 12/11/2011  . Irregular periods 08/17/2011  . Endometriosis 08/17/2011  . Edema 05/10/2011  . Elevated blood pressure reading 05/10/2011  . Fatigue 04/08/2011  . Upper respiratory tract infection 04/08/2011  . SINUSITIS, RECURRENT 11/06/2009  . FATIGUE 02/25/2009  . PALPITATIONS 02/25/2009  . DYSPNEA 02/25/2009  . KNEE INJURY, RIGHT 12/05/2007  . HEADACHE 05/03/2007  . ADULT SITUATIONAL REACTION 11/21/2006  . SLEEPLESSNESS 11/21/2006  . CHEST PAIN 11/21/2006  . TINEA PEDIS 08/31/2006  . Other and unspecified hyperlipidemia 08/31/2006  . DISORDER, ADJUSTMENT W/ANXIETY 08/31/2006  . PLANTAR FASCIITIS 08/31/2006  . ANEMIA-NOS  06/23/2006  . MIGRAINE, COMMON 06/23/2006  . ALLERGIC RHINITIS 06/23/2006  . GERD 06/23/2006  . IRRITABLE BOWEL SYNDROME 06/23/2006    Past Medical History:  Diagnosis Date  . Allergic rhinitis    hx of testing and allergic to spring and fall f= pollens  . Anemia    nos  . Anxiety   . Chest pain    neg stress test summer 2010  . Endometriosis   . Endometriosis   . Fibroids   . GERD (gastroesophageal reflux disease)   . Goiter   . Headache(784.0)   . 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 Hyperlipidemia   . Hypertension   . Irritable bowel syndrome   . Labial cyst 04/08/2011   early infection use hotpcompresses    . Migraines   . Ovarian torsion   . Sinusitis   . STD (sexually transmitted disease)    Pos. Chlamydia--in her 32's  . Thyroid disease    goiter    Past Surgical History:  Procedure Laterality Date  . CESAREAN SECTION  2002, 2004   x2  . PELVIC LAPAROSCOPY     x9  . RADIOLOGY WITH ANESTHESIA N/A 09/05/2014   Procedure: MRI OF BRAIN WITHOUT CONTRAST;  Surgeon: Medication Radiologist, MD;  Location: [facility];  Service: Radiology;  Laterality: N/A;  MRI/DR. JAFFE  . TUBAL LIGATION    . UNILATERAL SALPINGECTOMY Right 1995   ectopic pregnancy    Current Outpatient Medications  Medication Sig Dispense Refill  . AIMOVIG 140 DOSE 70 MG/ML SOAJ Inject 2 mLs (140 mg total) into the skin every 30 days for 30 days  2  . atorvastatin (LIPITOR) 10 MG tablet Take 1 tablet (10 mg total) by mouth daily. 30 tablet 2  . azithromycin (ZITHROMAX) 250 MG tablet Take 2 pills on day 1.  Then take 1 pill daily on days 2 through 5. 6 tablet 0  . baclofen (LIORESAL) 10 MG tablet take ONE-HALF to 1 TAB AS NEEDED to TWICE DAILY, headache. Limit to 2 headache days per week. Avoid daily use.  0  . cloNIDine (CATAPRES - DOSED IN MG/24 HR) 0.2 mg/24hr patch Place 1 patch (0.2 mg total) onto the skin once a week. 4 patch 2  . Elagolix Sodium  (ORILISSA) 150 MG TABS Take 150 mg by mouth daily. 30 tablet 5  . fluticasone (FLONASE) 50 MCG/ACT nasal spray Place 1 spray into both nostrils daily. 16 g 0  . megestrol (MEGACE) 20 MG tablet Take 1 tablet (20 mg total) by mouth 3 (three) times daily. 30 tablet 0  . Multiple Vitamin (MULTIVITAMIN WITH MINERALS) TABS tablet Take 1 tablet by mouth daily.    . pantoprazole (PROTONIX) 40 MG tablet Take 1 tablet (40 mg total) by mouth daily. 90 tablet 0  . promethazine (PHENERGAN) 25 MG tablet Take 1 tablet (25 mg total) by mouth every 6 (six) hours as needed for nausea or vomiting. 60 tablet 1  . triamterene-hydrochlorothiazide (MAXZIDE-25) 37.5-25 MG tablet Take 1 tablet by mouth daily. 90 tablet 0  . venlafaxine XR (EFFEXOR-XR) 150 MG 24 hr capsule Take 1 capsule (150 mg total) by mouth daily with breakfast. 90 capsule 0  . zonisamide (ZONEGRAN) 100 MG capsule Take 400 mg by mouth at bedtime.   1   No current facility-administered medications for this visit.      ALLERGIES: Codeine phosphate; Moxifloxacin; Sulfamethoxazole; and Sumatriptan succinate  Family History  Problem Relation Age of Onset  . Anxiety disorder Mother        mother is on xanax and apparently applying for  social security diabilty for her anxiety. t  . Depression Mother   . Diabetes Mother   . Hypertension Mother   . Heart attack Father   . Seizures Father   . Stroke Maternal Grandmother   . Diabetes Paternal Grandmother   . Hypertension Paternal Grandmother   . Coronary artery disease Unknown   . Diabetes Unknown   . Hyperlipidemia Unknown   . Other Unknown        history of child death in family   . Colon cancer Neg Hx   . Stomach cancer Neg Hx   . Esophageal cancer Neg Hx     Social History   Socioeconomic History  . Marital status: Single    Spouse name: Not on file  . Number of children: Not on file  . Years of education: Not on file  . Highest education level: Not on file  Occupational History  .  Not on file  Social Needs  . Financial resource strain: Not on file  . Food insecurity:    Worry: Not on file    Inability: Not on file  . Transportation needs:    Medical: Not on file  Non-medical: Not on file  Tobacco Use  . Smoking status: Never Smoker  . Smokeless tobacco: Never Used  Substance and Sexual Activity  . Alcohol use: No    Alcohol/week: 0.0 oz  . Drug use: No  . Sexual activity: Yes    Partners: Male    Birth control/protection: Surgical    Comment: Tubal/Mirena IUD inserted 06-19-15  Lifestyle  . Physical activity:    Days per week: Not on file    Minutes per session: Not on file  . Stress: Not on file  Relationships  . Social connections:    Talks on phone: Not on file    Gets together: Not on file    Attends religious service: Not on file    Active member of club or organization: Not on file    Attends meetings of clubs or organizations: Not on file    Relationship status: Not on file  . Intimate partner violence:    Fear of current or ex partner: Not on file    Emotionally abused: Not on file    Physically abused: Not on file    Forced sexual activity: Not on file  Other Topics Concern  . 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   And graduated as SW.      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    General appearance: alert, cooperative and appears stated age Head: Normocephalic, without obvious  abnormality, atraumatic Neck: no adenopathy, supple, symmetrical, trachea midline and thyroid normal to inspection and palpation Lungs: clear to auscultation bilaterally Breasts: normal appearance, no masses or tenderness, No nipple retraction or dimpling, No nipple discharge or bleeding, No axillary or supraclavicular adenopathy Heart: regular rate and rhythm Abdomen: soft, non-tender, no masses,  no organomegaly Extremities: extremities normal, atraumatic, no cyanosis or edema Skin: Skin color, texture, turgor normal. No rashes or lesions Lymph nodes: Cervical, supraclavicular, and axillary nodes normal. No abnormal inguinal nodes palpated Neurologic: Grossly normal  Pelvic: External genitalia:  no lesions              Urethra:  normal appearing urethra with no masses, tenderness or lesions              Bartholins and Skenes: normal                 Vagina: normal appearing vagina with normal color and discharge, no lesions              Cervix: no lesions                Bimanual Exam:  Uterus:  normal size, contour, position, consistency, mobility, non-tender              Adnexa:  no mass, fullness, tenderness              Rectal exam: {yes no:[id number]}.  Confirms.              Anus:  normal sphincter tone, no lesions  Chaperone was present for exam.  ASSESSMENT     PLAN     An After Visit Summary was printed and given to the patient.  ______ minutes face to face time of which over 50% was spent in counseling.

## 2017-08-01 NOTE — Progress Notes (Signed)
GYNECOLOGY  VISIT   HPI: 47 y.o.   Single  African American  female   [phone] with Patient's last menstrual period was 07/24/2017.   here for AUB/bilateral breast pain UPT: negative  On Orilissa since February or March 2019 due to dysmenorrhea.  She has no late pills or missed pills.  No vaginal bleeding until now.   Developed heavy bleeding and changed a tampon and panty liner every 1.5 - 2 hours for 2 hours.  Called on call MD, and Dr. Phineas Real treated with Megace 20 mg tid starting 07/28/17. Bleeding is now almost stopped.  She feels wiped out from the bleeding and her migraines.   Was thinking she was starting a UTI with dysuria.   Has one 46 mm fibroid and thin symmetric endometrium with IUD in place in Jan 2019.  Mirena IUD removed on 05/24/17.  Last EMB showed dyssynchronous endometrium.  05/09/14.  Chronic migraines started in 2015.  They are debilitating.  Her headaches were the most debilitating when she had the Mirena IUD.  Will stop Amivig.  Will start Amgality and botox injections.   GYNECOLOGIC HISTORY: Patient's last menstrual period was 07/24/2017. Contraception: Tubal ligation Menopausal hormone therapy:  none Last mammogram:  03/10/17 Left Breast MM/US - BIRADS 2 benign/density b Last pap smear: 10-19-16 Neg:Neg HR HPV, 09-10-13 Neg:Neg HR HPV         OB History    Gravida  7   Para  2   Term      Preterm      AB  5   Living  2     SAB  1   TAB  1   Ectopic  3   Multiple      Live Births           Obstetric Comments  3 ectopic           Patient Active Problem List   Diagnosis Date Noted  . Depression 06/07/2017  . Bilateral carpal tunnel syndrome 01/20/2017  . Left sided numbness 01/20/2017  . Morbid obesity ([place]) 01/30/2015  . 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 RUQ pain 02/25/2014  . Diarrhea 02/25/2014  . Calcium oxalate crystals in urine 02/22/2014  . Intramural leiomyoma of uterus  02/22/2014  . Hx of migraine headaches 02/04/2014  . Medication management 02/04/2014  . Hx of endometriosis 02/04/2014  . Right lower quadrant abdominal pain mid also 02/04/2014  . Adjustment disorder with anxious mood 01/17/2014  . Thyroid mass 02/16/2013  . Fever, unspecified 02/15/2013  . Sinusitis 02/15/2013  . Anxiety 12/11/2011  . Stress 12/11/2011  . Irregular periods 08/17/2011  . Endometriosis 08/17/2011  . Edema 05/10/2011  . Elevated blood pressure reading 05/10/2011  . Fatigue 04/08/2011  . Upper respiratory tract infection 04/08/2011  . SINUSITIS, RECURRENT 11/06/2009  . FATIGUE 02/25/2009  . PALPITATIONS 02/25/2009  . DYSPNEA 02/25/2009  . KNEE INJURY, RIGHT 12/05/2007  . HEADACHE 05/03/2007  . ADULT SITUATIONAL REACTION 11/21/2006  . SLEEPLESSNESS 11/21/2006  . CHEST PAIN 11/21/2006  . TINEA PEDIS 08/31/2006  . Other and unspecified hyperlipidemia 08/31/2006  . DISORDER, ADJUSTMENT W/ANXIETY 08/31/2006  . PLANTAR FASCIITIS 08/31/2006  . ANEMIA-NOS 06/23/2006  . MIGRAINE, COMMON 06/23/2006  . ALLERGIC RHINITIS 06/23/2006  . GERD 06/23/2006  . IRRITABLE BOWEL SYNDROME 06/23/2006    Past Medical History:  Diagnosis Date  . Allergic rhinitis    hx of testing and allergic to spring and fall f= pollens  . Anemia  nos  . Anxiety   . Chest pain    neg stress test summer 2010  . Endometriosis   . Endometriosis   . Fibroids   . GERD (gastroesophageal reflux disease)   . Goiter   . Headache(784.0)   . 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 Hyperlipidemia   . Hypertension   . Irritable bowel syndrome   . Labial cyst 04/08/2011   early infection use hotpcompresses    . Migraines   . Ovarian torsion   . Sinusitis   . STD (sexually transmitted disease)    Pos. Chlamydia--in her 30's  . Thyroid disease    goiter    Past Surgical History:  Procedure Laterality Date  . CESAREAN SECTION  2002, 2004   x2   . PELVIC LAPAROSCOPY     x9  . RADIOLOGY WITH ANESTHESIA N/A 09/05/2014   Procedure: MRI OF BRAIN WITHOUT CONTRAST;  Surgeon: Medication Radiologist, MD;  Location: [facility];  Service: Radiology;  Laterality: N/A;  MRI/DR. JAFFE  . TUBAL LIGATION    . UNILATERAL SALPINGECTOMY Right 1995   ectopic pregnancy    Current Outpatient Medications  Medication Sig Dispense Refill  . baclofen (LIORESAL) 10 MG tablet take ONE-HALF to 1 TAB AS NEEDED to TWICE DAILY, headache. Limit to 2 headache days per week. Avoid daily use.  0  . cloNIDine (CATAPRES - DOSED IN MG/24 HR) 0.2 mg/24hr patch Place 1 patch (0.2 mg total) onto the skin once a week. 4 patch 2  . Elagolix Sodium (ORILISSA) 150 MG TABS Take 150 mg by mouth daily. 30 tablet 5  . fluticasone (FLONASE) 50 MCG/ACT nasal spray Place 1 spray into both nostrils daily. 16 g 0  . megestrol (MEGACE) 20 MG tablet Take 1 tablet (20 mg total) by mouth 3 (three) times daily. 30 tablet 0  . Multiple Vitamin (MULTIVITAMIN WITH MINERALS) TABS tablet Take 1 tablet by mouth daily.    . pantoprazole (PROTONIX) 40 MG tablet Take 1 tablet (40 mg total) by mouth daily. 90 tablet 0  . promethazine (PHENERGAN) 25 MG tablet Take 1 tablet (25 mg total) by mouth every 6 (six) hours as needed for nausea or vomiting. 60 tablet 1  . triamterene-hydrochlorothiazide (MAXZIDE-25) 37.5-25 MG tablet Take 1 tablet by mouth daily. 90 tablet 0  . venlafaxine XR (EFFEXOR-XR) 150 MG 24 hr capsule Take 1 capsule (150 mg total) by mouth daily with breakfast. 90 capsule 0  . zonisamide (ZONEGRAN) 100 MG capsule Take 400 mg by mouth at bedtime.   1  . atorvastatin (LIPITOR) 10 MG tablet Take 1 tablet (10 mg total) by mouth daily. (Patient not taking: Reported on 08/01/2017) 30 tablet 2   No current facility-administered medications for this visit.      ALLERGIES: Codeine phosphate; Moxifloxacin; Sulfamethoxazole; and Sumatriptan succinate  Family History  Problem Relation Age of Onset   . Anxiety disorder Mother        mother is on xanax and apparently applying for  social security diabilty for her anxiety. t  . Depression Mother   . Diabetes Mother   . Hypertension Mother   . Heart attack Father   . Seizures Father   . Stroke Maternal Grandmother   . Diabetes Paternal Grandmother   . Hypertension Paternal Grandmother   . Coronary artery disease Unknown   . Diabetes Unknown   . Hyperlipidemia Unknown   . Other Unknown  history of child death in family   . Colon cancer Neg Hx   . Stomach cancer Neg Hx   . Esophageal cancer Neg Hx     Social History   Socioeconomic History  . Marital status: Single    Spouse name: Not on file  . Number of children: Not on file  . Years of education: Not on file  . Highest education level: Not on file  Occupational History  . Not on file  Social Needs  . Financial resource strain: Not on file  . Food insecurity:    Worry: Not on file    Inability: Not on file  . Transportation needs:    Medical: Not on file    Non-medical: Not on file  Tobacco Use  . Smoking status: Never Smoker  . Smokeless tobacco: Never Used  Substance and Sexual Activity  . Alcohol use: No    Alcohol/week: 0.0 oz  . Drug use: No  . Sexual activity: Yes    Partners: Male    Birth control/protection: Surgical    Comment: Tubal  Lifestyle  . Physical activity:    Days per week: Not on file    Minutes per session: Not on file  . Stress: Not on file  Relationships  . Social connections:    Talks on phone: Not on file    Gets together: Not on file    Attends religious service: Not on file    Active member of club or organization: Not on file    Attends meetings of clubs or organizations: Not on file    Relationship status: Not on file  . Intimate partner violence:    Fear of current or ex partner: Not on file    Emotionally abused: Not on file    Physically abused: Not on file    Forced sexual activity: Not on file  Other Topics  Concern  . 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   And graduated as SW.      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   Eyes: Negative.   Respiratory: Negative.   Cardiovascular: Negative.   Gastrointestinal: Negative.   Endocrine: Negative.   Genitourinary:       Excess bleeding Painful periods Menstrual cycle changes Unscheduled bleeding  Musculoskeletal: Negative.   Skin: Negative.   Allergic/Immunologic: Negative.   Neurological: Negative.   Hematological: Negative.   Psychiatric/Behavioral: Negative.     PHYSICAL EXAMINATION:    BP 114/60 (BP Location: Right Arm, Patient Position: Sitting, Cuff Size: Large)   Pulse 76   Resp 16   Ht 5' 5.5" (1.664 m)   Wt 225 lb (102.1 kg)   LMP 07/24/2017   BMI 36.87 kg/m  General appearance: alert, cooperative and appears stated age  Pelvic: External genitalia:  no lesions              Urethra:  normal appearing urethra with no masses, tenderness or lesions              Bartholins and Skenes: normal                 Vagina: normal appearing vagina with normal color and discharge, no lesions              Cervix: no lesions.  Minimal bleeding noted.  4 cm fundal fibroid.                 Bimanual Exam:  Uterus:  normal size, contour, position, consistency, mobility, non-tender              Adnexa: no mass, fullness, tenderness      Chaperone was present for  exam.  ASSESSMENT  Abnormal uterine bleeding.  Uterine fibroid.  Dysmenorrhea. Dysuria?  PLAN  We discussed options for care for bleeding - Depo Provera, Depo Lupron, Megace short term. We reviewed Freida Busman as treatment of pain.  Hysterectomy also discussed.  Uterine artery embolization will treat bleeding but not pain.  Will finish the 1 month rx for Megace.  She will then restart the Orlissa.  Will check urine micro and culture.  Annual exam in October 2019.    An After Visit Summary was printed and given to the patient.  _25_____ minutes face to face time of which over 50% was spent in counseling.

## 2017-08-02 LAB — URINALYSIS, MICROSCOPIC ONLY

## 2017-08-02 LAB — URINE CULTURE

## 2017-08-24 ENCOUNTER — Encounter: Payer: Self-pay | Admitting: Internal Medicine

## 2017-08-24 NOTE — Telephone Encounter (Signed)
Please advise Dr Panosh, thanks.   

## 2017-09-01 ENCOUNTER — Telehealth: Payer: Self-pay | Admitting: Family Medicine

## 2017-09-01 NOTE — Telephone Encounter (Signed)
Copied from [place] [id number]. Topic: General - Other >> Sep 01, 2017 11:46 AM Berneta Levins wrote: Reason for CRM: Manuela Schwartz from [facility] calling to report that pt has been enrolled in the Case Management program - cholesterol.

## 2017-09-02 NOTE — Telephone Encounter (Signed)
LM for Manuela Schwartz at [facility] to see if there was anything needed for this or it was just an [place].  Will await call back

## 2017-09-05 NOTE — Telephone Encounter (Signed)
[name] w/ [employer] called back in, she said that the message left was just a FYI, no action needed/required on providers part.

## 2017-09-05 NOTE — Telephone Encounter (Signed)
noted 

## 2017-09-14 NOTE — Progress Notes (Deleted)
No chief complaint on file.   HPI: [name] 47 y.o. come in for  ROS: See pertinent positives and negatives per HPI.  Past Medical History:  Diagnosis Date  . Allergic rhinitis    hx of testing and allergic to spring and fall f= pollens  . Anemia    nos  . Anxiety   . Chest pain    neg stress test summer 2010  . Endometriosis   . Endometriosis   . Fibroids   . GERD (gastroesophageal reflux disease)   . Goiter   . Headache(784.0)   . 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 Hyperlipidemia   . Hypertension   . Irritable bowel syndrome   . Labial cyst 04/08/2011   early infection use hotpcompresses    . Migraines   . Ovarian torsion   . Sinusitis   . STD (sexually transmitted disease)    Pos. Chlamydia--in her 80's  . Thyroid disease    goiter    Family History  Problem Relation Age of Onset  . Anxiety disorder Mother        mother is on xanax and apparently applying for  social security diabilty for her anxiety. t  . Depression Mother   . Diabetes Mother   . Hypertension Mother   . Heart attack Father   . Seizures Father   . Stroke Maternal Grandmother   . Diabetes Paternal Grandmother   . Hypertension Paternal Grandmother   . Coronary artery disease Unknown   . Diabetes Unknown   . Hyperlipidemia Unknown   . Other Unknown        history of child death in family   . Colon cancer Neg Hx   . Stomach cancer Neg Hx   . Esophageal cancer Neg Hx     Social History   Socioeconomic History  . Marital status: Single    Spouse name: Not on file  . Number of children: Not on file  . Years of education: Not on file  . Highest education level: Not on file  Occupational History  . Not on file  Social Needs  . Financial resource strain: Not on file  . Food insecurity:    Worry: Not on file    Inability: Not on file  . Transportation needs:    Medical: Not on file    Non-medical: Not on file  Tobacco Use    . Smoking status: Never Smoker  . Smokeless tobacco: Never Used  Substance and Sexual Activity  . Alcohol use: No    Alcohol/week: 0.0 standard drinks  . Drug use: No  . Sexual activity: Yes    Partners: Male    Birth control/protection: Surgical    Comment: Tubal  Lifestyle  . Physical activity:    Days per week: Not on file    Minutes per session: Not on file  . Stress: Not on file  Relationships  . Social connections:    Talks on phone: Not on file    Gets together: Not on file    Attends religious service: Not on file    Active member of club or organization: Not on file    Attends meetings of clubs or organizations: Not on file    Relationship status: Not on file  Other Topics Concern  . 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   And graduated as SW.      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 atorvastatin (LIPITOR) 10 MG tablet Take 1 tablet (10 mg total) by mouth daily. (Patient not taking: Reported on 08/01/2017) 30 tablet 2  . baclofen (LIORESAL) 10 MG tablet take ONE-HALF to 1 TAB AS NEEDED to TWICE DAILY, headache. Limit to 2 headache days per week. Avoid daily use.  0  . cloNIDine (CATAPRES - DOSED IN MG/24 HR) 0.2 mg/24hr patch Place 1 patch (0.2 mg total) onto the skin once a week. 4 patch 2  . Elagolix Sodium (ORILISSA) 150 MG TABS Take 150 mg by mouth daily. 30 tablet 5  . fluticasone (FLONASE) 50 MCG/ACT nasal spray Place 1  spray into both nostrils daily. 16 g 0  . megestrol (MEGACE) 20 MG tablet Take 1 tablet (20 mg total) by mouth 3 (three) times daily. 30 tablet 0  . Multiple Vitamin (MULTIVITAMIN WITH MINERALS) TABS tablet Take 1 tablet by mouth daily.    . pantoprazole (PROTONIX) 40 MG tablet Take 1 tablet (40 mg total) by mouth daily. 90 tablet 0  . promethazine (PHENERGAN) 25 MG tablet Take 1 tablet (25 mg total) by mouth every 6 (six) hours as needed for nausea or vomiting. 60 tablet 1  . triamterene-hydrochlorothiazide (MAXZIDE-25) 37.5-25 MG tablet Take 1 tablet by mouth daily. 90 tablet 0  . venlafaxine XR (EFFEXOR-XR) 150 MG 24 hr capsule Take 1 capsule (150 mg total) by mouth daily with breakfast. 90 capsule 0  . zonisamide (ZONEGRAN) 100 MG capsule Take 400 mg by mouth at bedtime.   1   No facility-administered medications prior to visit.      EXAM:  There were no vitals taken for this visit.  There is no height or weight on file to calculate BMI.  GENERAL: vitals reviewed and listed above, alert, oriented, appears well hydrated and in no acute distress HEENT: atraumatic, conjunctiva  clear, no obvious abnormalities on inspection of external nose and ears OP : no lesion edema or exudate  NECK: no obvious masses on inspection palpation  LUNGS: clear to auscultation bilaterally, no wheezes, rales or rhonchi, good air movement CV: HRRR, no clubbing cyanosis or  peripheral edema nl cap refill  MS: moves all extremities without noticeable focal  abnormality PSYCH: pleasant and cooperative, no obvious depression or anxiety Lab Results  Component Value Date   WBC 8.4 06/07/2017   HGB 13.3 06/07/2017   HCT 39.9 06/07/2017   PLT 474.0 (H) 06/07/2017   GLUCOSE 100 (H) 06/07/2017   CHOL 264 (H) 07/21/2017   TRIG 147.0 07/21/2017   HDL 42.70 07/21/2017   LDLDIRECT 167.0 06/07/2017   LDLCALC 192 (H) 07/21/2017   ALT 10 05/24/2017   AST 12 05/24/2017   NA 145 06/07/2017   K 3.6 06/07/2017   CL  104  06/07/2017   CREATININE 0.97 06/07/2017   BUN 14 06/07/2017   CO2 30 06/07/2017   TSH 1.40 07/21/2017   HGBA1C 6.0 06/07/2017   BP Readings from Last 3 Encounters:  08/01/17 114/60  07/22/17 118/76  06/19/17 140/81    ASSESSMENT AND PLAN:  Discussed the following assessment and plan:  No diagnosis found.  -Patient advised to return or notify health care team  if  new concerns arise.  There are no Patient Instructions on file for this visit.   Standley Brooking. Dagan Heinz M.D.

## 2017-09-15 ENCOUNTER — Ambulatory Visit: Payer: BLUE CROSS/BLUE SHIELD | Admitting: Internal Medicine

## 2017-09-15 DIAGNOSIS — Z0289 Encounter for other administrative examinations: Secondary | ICD-10-CM

## 2017-09-29 ENCOUNTER — Encounter: Payer: Self-pay | Admitting: Obstetrics and Gynecology

## 2017-09-30 ENCOUNTER — Other Ambulatory Visit: Payer: Self-pay | Admitting: Obstetrics and Gynecology

## 2017-09-30 ENCOUNTER — Encounter: Payer: Self-pay | Admitting: Obstetrics and Gynecology

## 2017-09-30 ENCOUNTER — Telehealth: Payer: Self-pay | Admitting: Obstetrics and Gynecology

## 2017-09-30 ENCOUNTER — Other Ambulatory Visit: Payer: Self-pay

## 2017-09-30 ENCOUNTER — Ambulatory Visit (INDEPENDENT_AMBULATORY_CARE_PROVIDER_SITE_OTHER): Payer: BLUE CROSS/BLUE SHIELD | Admitting: Obstetrics and Gynecology

## 2017-09-30 VITALS — BP 110/78 | HR 78 | Resp 16 | Ht 65.0 in | Wt 229.0 lb

## 2017-09-30 DIAGNOSIS — D219 Benign neoplasm of connective and other soft tissue, unspecified: Secondary | ICD-10-CM

## 2017-09-30 DIAGNOSIS — N921 Excessive and frequent menstruation with irregular cycle: Secondary | ICD-10-CM | POA: Diagnosis not present

## 2017-09-30 DIAGNOSIS — Z1231 Encounter for screening mammogram for malignant neoplasm of breast: Secondary | ICD-10-CM

## 2017-09-30 MED ORDER — MEGESTROL ACETATE 20 MG PO TABS: 20.0000 mg | ORAL_TABLET | Freq: Three times a day (TID) | ORAL | 1 refills | Status: DC

## 2017-09-30 NOTE — Progress Notes (Signed)
GYNECOLOGY  VISIT   HPI: 47 y.o.   Single  African American  female   [phone] with Patient's last menstrual period was 09/28/2017.   here for abnormal uterine bleeding.   On Orilissa.  A couple of days ago started cramping.  Bleeding started yesterday.  Changing tampon every 2 hours.  No dizziness or lightheadedness.   Has intermittent heavy bleeding and takes Megace.  Last stopped this one month ago  Last EMB was 05/09/14 - dysynchronous endometrium.   Has a 4.5 cm fibroid, last evaluated with [place] 02/17/17.  Right ovary normal.  Left ovary absent.   Back on Botox for migraines. Cannot take over the counter meds due to rebound migraine HA.   No new sexual partner.   Will have insurance change the end of October.   GYNECOLOGIC HISTORY: Patient's last menstrual period was 09/28/2017. Contraception:  OCP Menopausal hormone therapy:  none Last mammogram:  03/10/2017 BI-RADS CATEGORY  2: Benign. Last pap smear:   10/19/2016 normal        OB History    Gravida  7   Para  2   Term      Preterm      AB  5   Living  2     SAB  1   TAB  1   Ectopic  3   Multiple      Live Births           Obstetric Comments  3 ectopic           Patient Active Problem List   Diagnosis Date Noted  . Depression 06/07/2017  . Bilateral carpal tunnel syndrome 01/20/2017  . Left sided numbness 01/20/2017  . Morbid obesity ([place]) 01/30/2015  . 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 RUQ pain 02/25/2014  . Diarrhea 02/25/2014  . Calcium oxalate crystals in urine 02/22/2014  . Intramural leiomyoma of uterus 02/22/2014  . Hx of migraine headaches 02/04/2014  . Medication management 02/04/2014  . Hx of endometriosis 02/04/2014  . Right lower quadrant abdominal pain mid also 02/04/2014  . Adjustment disorder with anxious mood 01/17/2014  . Thyroid mass 02/16/2013  . Fever, unspecified 02/15/2013  . Sinusitis 02/15/2013  . Anxiety  12/11/2011  . Stress 12/11/2011  . Irregular periods 08/17/2011  . Endometriosis 08/17/2011  . Edema 05/10/2011  . Elevated blood pressure reading 05/10/2011  . Fatigue 04/08/2011  . Upper respiratory tract infection 04/08/2011  . SINUSITIS, RECURRENT 11/06/2009  . FATIGUE 02/25/2009  . PALPITATIONS 02/25/2009  . DYSPNEA 02/25/2009  . KNEE INJURY, RIGHT 12/05/2007  . HEADACHE 05/03/2007  . ADULT SITUATIONAL REACTION 11/21/2006  . SLEEPLESSNESS 11/21/2006  . CHEST PAIN 11/21/2006  . TINEA PEDIS 08/31/2006  . Other and unspecified hyperlipidemia 08/31/2006  . DISORDER, ADJUSTMENT W/ANXIETY 08/31/2006  . PLANTAR FASCIITIS 08/31/2006  . ANEMIA-NOS 06/23/2006  . MIGRAINE, COMMON 06/23/2006  . ALLERGIC RHINITIS 06/23/2006  . GERD 06/23/2006  . IRRITABLE BOWEL SYNDROME 06/23/2006    Past Medical History:  Diagnosis Date  . Allergic rhinitis    hx of testing and allergic to spring and fall f= pollens  . Anemia    nos  . Anxiety   . Chest pain    neg stress test summer 2010  . Endometriosis   . Endometriosis   . Fibroids   . GERD (gastroesophageal reflux disease)   . Goiter   . Headache(784.0)   . 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 Hyperlipidemia   . Hypertension   . Irritable bowel syndrome   . Labial cyst 04/08/2011   early infection use hotpcompresses    . Migraines   . Ovarian torsion   . Sinusitis   . STD (sexually transmitted disease)    Pos. Chlamydia--in her 19's  . Thyroid disease    goiter    Past Surgical History:  Procedure Laterality Date  . CESAREAN SECTION  2002, 2004   x2  . PELVIC LAPAROSCOPY     x9  . RADIOLOGY WITH ANESTHESIA N/A 09/05/2014   Procedure: MRI OF BRAIN WITHOUT CONTRAST;  Surgeon: Medication Radiologist, MD;  Location: [facility];  Service: Radiology;  Laterality: N/A;  MRI/DR. JAFFE  . TUBAL LIGATION    . UNILATERAL SALPINGECTOMY Right 1995   ectopic pregnancy    Current  Outpatient Medications  Medication Sig Dispense Refill  . atorvastatin (LIPITOR) 10 MG tablet Take 1 tablet (10 mg total) by mouth daily. 30 tablet 2  . baclofen (LIORESAL) 10 MG tablet take ONE-HALF to 1 TAB AS NEEDED to TWICE DAILY, headache. Limit to 2 headache days per week. Avoid daily use.  0  . cloNIDine (CATAPRES - DOSED IN MG/24 HR) 0.2 mg/24hr patch Place 1 patch (0.2 mg total) onto the skin once a week. 4 patch 2  . Elagolix Sodium (ORILISSA) 150 MG TABS Take 150 mg by mouth daily. 30 tablet 5  . fluticasone (FLONASE) 50 MCG/ACT nasal spray Place 1 spray into both nostrils daily. 16 g 0  . Multiple Vitamin (MULTIVITAMIN WITH MINERALS) TABS tablet Take 1 tablet by mouth daily.    . pantoprazole (PROTONIX) 40 MG tablet Take 1 tablet (40 mg total) by mouth daily. 90 tablet 0  . promethazine (PHENERGAN) 25 MG tablet Take 1 tablet (25 mg total) by mouth every 6 (six) hours as needed for nausea or vomiting. 60 tablet 1  . triamterene-hydrochlorothiazide (MAXZIDE-25) 37.5-25 MG tablet Take 1 tablet by mouth daily. 90 tablet 0  . venlafaxine XR (EFFEXOR-XR) 150 MG 24 hr capsule Take 1 capsule (150 mg total) by mouth daily with breakfast. 90 capsule 0  . zonisamide (ZONEGRAN) 100 MG capsule Take 400 mg by mouth at bedtime.   1   No current facility-administered medications for this visit.      ALLERGIES: Codeine phosphate; Moxifloxacin; Sulfamethoxazole; and Sumatriptan succinate  Family History  Problem Relation Age of Onset  . Anxiety disorder Mother        mother is on xanax and apparently applying for  social security diabilty for her anxiety. t  . Depression Mother   . Diabetes Mother   . Hypertension Mother   . Heart attack Father   . Seizures Father   . Stroke Maternal Grandmother   . Diabetes Paternal Grandmother   . Hypertension Paternal Grandmother   . Coronary artery disease Unknown   . Diabetes Unknown   . Hyperlipidemia Unknown   . Other Unknown        history of  child death in family   . Colon cancer Neg Hx   . Stomach cancer Neg Hx   . Esophageal cancer Neg Hx     Social History   Socioeconomic History  . Marital status: Single    Spouse name: Not on file  . Number of children: Not on file  . Years of education: Not on file  . Highest education level: Not on file  Occupational History  . Not  on file  Social Needs  . Financial resource strain: Not on file  . Food insecurity:    Worry: Not on file    Inability: Not on file  . Transportation needs:    Medical: Not on file    Non-medical: Not on file  Tobacco Use  . Smoking status: Never Smoker  . Smokeless tobacco: Never Used  Substance and Sexual Activity  . Alcohol use: No    Alcohol/week: 0.0 standard drinks  . Drug use: No  . Sexual activity: Yes    Partners: Male    Birth control/protection: Surgical    Comment: Tubal  Lifestyle  . Physical activity:    Days per week: Not on file    Minutes per session: Not on file  . Stress: Not on file  Relationships  . Social connections:    Talks on phone: Not on file    Gets together: Not on file    Attends religious service: Not on file    Active member of club or organization: Not on file    Attends meetings of clubs or organizations: Not on file    Relationship status: Not on file  . Intimate partner violence:    Fear of current or ex partner: Not on file    Emotionally abused: Not on file    Physically abused: Not on file    Forced sexual activity: Not on file  Other Topics Concern  . 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   And graduated as SW.      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   HENT: Negative.   Eyes: Negative.   Respiratory: Negative.   Cardiovascular: Negative.   Gastrointestinal: Positive for abdominal distention, nausea and vomiting.  Endocrine: Positive for cold intolerance and heat intolerance.       Craving sweets  Genitourinary: Negative.        Abnormal uterine bleeding Menstrual changes Unscheduled bleeding, spotting  Musculoskeletal: Negative.   Skin: Negative.   Allergic/Immunologic: Negative.   Neurological: Positive for headaches.  Hematological: Negative.        Abnormal uterine bleeding  Psychiatric/Behavioral: Positive for dysphoric mood.  All other systems reviewed and are negative.   PHYSICAL EXAMINATION:    BP 110/78   Pulse 78   Resp 16   Ht 5\' 5"  (1.651 m)   Wt 229 lb (103.9 kg)   LMP 09/28/2017   BMI 38.11 kg/m     General appearance: alert, cooperative and appears stated age    Pelvic: External genitalia:  no lesions              Urethra:  normal appearing urethra with no masses, tenderness or lesions              Bartholins and  Skenes: normal                 Vagina: normal appearing vagina with normal color and discharge, no lesions              Cervix: no lesions.  Menstrual flow from os.                 Bimanual Exam:  Uterus:   8 week size.               Adnexa: no mass, fullness, tenderness         EMB: Consent for procedure. Sterile prep with   Paracervical block with 1% lidocaine 6 cc, lot number  [id number], expiration 1/23. Tenaculum to anterior cervical lip. Pipelle passed to    7  cm twice.   Tissue to pathology.  Minimal EBL. No complications.   Chaperone was present for exam.  ASSESSMENT   Menorrhagia with irregular menses.  Uterine fibroid. Dysmenorrhea.   On Orilissa.   PLAN  Stop Freida Busman. Start Megace  20 mg po tid.  #90, RF none.  FU EMB.  Instructions and precautions given.  We discussed options for care including Depo Lupron, uterine artery embolization, and hysterectomy.  Will move toward consultation with radiology for uterine artery embolization.  Will get mammogram up to date.   An After Visit Summary was printed and given to the patient.  __25____ minutes face to face time of which over 50% was spent in counseling.

## 2017-09-30 NOTE — Telephone Encounter (Signed)
Spoke with patient at time of incoming call.  Started cycle on 09/28/17. Light bleeding and cramps.  Today, she reports soaking 3 regular tampons since 0630.  Denies feelings of weakness, dizziness or SOB.  Hx Migraines, has one today.  Reviewed with Dr. Quincy Simmonds, office visit today.  Returned call to patient and she is agreeable to plan.  Encounter closed.

## 2017-09-30 NOTE — Telephone Encounter (Signed)
Patient called back stating that she has changed her tampon 3 times since 6am.

## 2017-09-30 NOTE — Patient Instructions (Addendum)
Uterine Artery Embolization for Fibroids Uterine artery embolization is a nonsurgical treatment to shrink fibroids. A thin plastic tube (catheter) is used to inject material that blocks off the blood supply to the fibroid, which causes the fibroid to shrink. Tell a health care provider about:  Any allergies you have.  All medicines you are taking, including vitamins, herbs, eye drops, creams, and over-the-counter medicines.  Any problems you or family members have had with anesthetic medicines.  Any blood disorders you have.  Any surgeries you have had.  Any medical conditions you have. What are the risks?  Injury to the uterus from decreased blood supply  Infection.  Blood infection (septicemia).  Lack of menstrual periods (amenorrhea).  Death of tissue cells (necrosis) around your bladder or vulva.  Development of a hole between organs or from an organ to the surface of your skin (fistula).  Blood clot in the legs (deep vein thrombosis) or lung (pulmonary embolus). What happens before the procedure?  Ask your health care provider about changing or stopping your regular medicines.  Do not take aspirin or blood thinners (anticoagulants) for 1 week before the surgery or as directed by your health care provider.  Do not eat or drink anything for 8 hours before the surgery or as directed by your health care provider.  Empty your bladder before the procedure begins. What happens during the procedure?  An IV tube will be placed into one of your veins. This will be used to give you a sedative and pain medication (conscious sedation).  You will be given a medicine that numbs the area (local anesthetic).  A small cut will be made in your groin. A catheter is then inserted into the main artery of your leg.  The catheter will be guided through the artery to your uterus. A series of images will be taken while dye is injected through the catheter in your groin. X-rays are taken at  the same time. This is done to provide a road map of the blood supply to your uterus and fibroids.  Tiny plastic spheres, about the size of sand grains, will be injected through the catheter. Metal coils may be used to help block the artery. The particles will lodge in tiny branches of the uterine artery that supplies blood to the fibroids.  The procedure is repeated on the artery that supplies the other side of the uterus.  The catheter is then removed and pressure is held to stop any bleeding. No stitches are needed.  A dressing is then placed over the cut (incision). What happens after the procedure?  You will be taken to a recovery area where your progress will be monitored until you are awake, stable, and taking fluids well. If there are no other problems, you will then be moved to a regular hospital room.  You will be observed overnight in the hospital.  You will have cramping that should be controlled with pain medication. This information is not intended to replace advice given to you by your health care provider. Make sure you discuss any questions you have with your health care provider. Document Released: 03/29/2005 Document Revised: 06/19/2015 Document Reviewed: 07/27/2012 Elsevier Interactive Patient Education  2018 [employer].   Endometrial Biopsy, Care After This sheet gives you information about how to care for yourself after your procedure. Your health care provider may also give you more specific instructions. If you have problems or questions, contact your health care provider. What can I expect after the  procedure? After the procedure, it is common to have:  Mild cramping.  A small amount of vaginal bleeding for a few days. This is normal.  Follow these instructions at home:  Take over-the-counter and prescription medicines only as told by your health care provider.  Do not douche, use tampons, or have sexual intercourse until your health care provider  approves.  Return to your normal activities as told by your health care provider. Ask your health care provider what activities are safe for you.  Follow instructions from your health care provider about any activity restrictions, such as restrictions on strenuous exercise or heavy lifting. Contact a health care provider if:  You have heavy bleeding, or bleed for longer than 2 days after the procedure.  You have bad smelling discharge from your vagina.  You have a fever or chills.  You have a burning sensation when urinating or you have difficulty urinating.  You have severe pain in your lower abdomen. Get help right away if:  You have severe cramps in your stomach or back.  You pass large blood clots.  Your bleeding increases.  You become weak or light-headed, or you pass out. Summary  After the procedure, it is common to have mild cramping and a small amount of vaginal bleeding for a few days.  Do not douche, use tampons, or have sexual intercourse until your health care provider approves.  Return to your normal activities as told by your health care provider. Ask your health care provider what activities are safe for you. This information is not intended to replace advice given to you by your health care provider. Make sure you discuss any questions you have with your health care provider. Document Released: 11/01/2012 Document Revised: 01/28/2016 Document Reviewed: 01/28/2016 Elsevier Interactive Patient Education  2017 [employer].

## 2017-09-30 NOTE — Progress Notes (Signed)
Screening mammogram scheduled for 10-27-17 at 1:10 at [facility]. Patient agreeable to appointment date and time.

## 2017-09-30 NOTE — Telephone Encounter (Signed)
Message   Dr. Quincy Simmonds I am having a period this month . Started yesterday spotting. Little heavier bleeding today but not bad. But the cramps are horrible. Also I wanted to mention I have a extremely bad migraine since Thursday not sure but there maybe some correlation . Do I need to do anything different? Thanks [employer]

## 2017-10-03 ENCOUNTER — Other Ambulatory Visit: Payer: Self-pay | Admitting: Internal Medicine

## 2017-10-03 ENCOUNTER — Telehealth: Payer: Self-pay | Admitting: Obstetrics and Gynecology

## 2017-10-03 DIAGNOSIS — D219 Benign neoplasm of connective and other soft tissue, unspecified: Secondary | ICD-10-CM

## 2017-10-03 NOTE — Telephone Encounter (Signed)
Please contact patient in follow up to her recent office visit.  She has menorrhagia with irregular menses and a uterine fibroid.   She needs a referral to radiology for a uterine artery embolization.

## 2017-10-03 NOTE — Telephone Encounter (Signed)
Referral for interventional radiology entered. Routing to referral department.

## 2017-10-04 ENCOUNTER — Other Ambulatory Visit: Payer: Self-pay | Admitting: Obstetrics and Gynecology

## 2017-10-04 DIAGNOSIS — D250 Submucous leiomyoma of uterus: Secondary | ICD-10-CM

## 2017-10-05 NOTE — Telephone Encounter (Signed)
Patient is scheduled with [facility] on 10/11/17, for an evaluation with interventional radiology. Will close phone encounter   cc: Dr Quincy Simmonds  cc: Lamont Snowball, RN

## 2017-10-06 ENCOUNTER — Telehealth: Payer: Self-pay | Admitting: Emergency Medicine

## 2017-10-06 NOTE — Telephone Encounter (Signed)
Call to patient and message given from Dr. Quincy Simmonds.  Pt verbalizes understanding of message and plans to keep appointment with IR.  Encounter closed.

## 2017-10-06 NOTE — Telephone Encounter (Signed)
-----   Message from Nunzio Cobbs, MD sent at 10/05/2017  5:36 PM EDT ----- Please report EMB results to the patient showing inactive endometrium.  She may be a good candidate for the uterine artery embolization.  She has an appointment on 10/11/17 with the interventional radiologist.

## 2017-10-11 ENCOUNTER — Ambulatory Visit
Admission: RE | Admit: 2017-10-11 | Discharge: 2017-10-11 | Disposition: A | Discharge status: Home / Self Care | Payer: BLUE CROSS/BLUE SHIELD | Source: Ambulatory Visit | Attending: Obstetrics and Gynecology | Admitting: Obstetrics and Gynecology

## 2017-10-11 ENCOUNTER — Encounter: Payer: Self-pay | Admitting: Radiology

## 2017-10-11 ENCOUNTER — Other Ambulatory Visit (HOSPITAL_COMMUNITY): Payer: Self-pay | Admitting: Interventional Radiology

## 2017-10-11 DIAGNOSIS — D219 Benign neoplasm of connective and other soft tissue, unspecified: Secondary | ICD-10-CM

## 2017-10-11 DIAGNOSIS — D250 Submucous leiomyoma of uterus: Secondary | ICD-10-CM

## 2017-10-11 HISTORY — PX: IR RADIOLOGIST EVAL & MGMT: IMG5224

## 2017-10-11 NOTE — Consult Note (Signed)
Chief Complaint: Symptomatic uterine fibroids  Referring Physician(s): Amundson C Silva,Brook E  History of Present Illness: [name] is a 47 y.o. (g7, p2) female with past medical history significant for hypertension, hyperlipidemia, thyroid disease, STD (chlamydia and HSV type II), ovarian torsion (post oophorectomy and salpingectomy) and endometriosis who presents today to the interventional radiology clinic for evaluation of percutaneous treatment options for symptomatic uterine fibroids.  The patient is unaccompanied and serves as her own historian.  Patient has a long history of menorrhagia which has been treated with previous IUD placements and currently with pharmacological menstruation suppression, however she still has experienced 3 breakthrough cycles this year.    Her cycles this year lasted approximately 11-13 days and are associated with heavy menstrual bleeding necessitating the changing of pads every 1-1/2 hours.   She has been previously diagnosed with anemia, though has never received a blood transfusion.  Intraoffice pelvic ultrasound performed 02/17/2017 reports an approximately 4.6 cm fibroid within the uterine fundus.  As patient has had previous history of pelvic inflammatory disease as well as ovarian torsion resulting in salpingectomy and oophorectomy, and has been deemed a poor candidate for hysterectomy as such presents today for potential uterine fibroid embolization.  Patient admits to crampy abdominal and pelvic pain at the time of her cycle.    She denies any bulk symptoms.  Specifically, no back pain, urinary urgency or frequency.  Patient has had previous bilateral tubal ligation.  Patient underwent endometrial biopsy on 9/10/9 2018 which was negative for evidence of hyperplasia, atypia or malignancy.  Patient is scheduled to undergo a Pap smear in October.  If able, patient is interested in undergoing the embolization prior to the end of  October at which time her insurance lapses.  Past Medical History:  Diagnosis Date  . Allergic rhinitis    hx of testing and allergic to spring and fall f= pollens  . Anemia    nos  . Anxiety   . Chest pain    neg stress test summer 2010  . Endometriosis   . Endometriosis   . Fibroids   . GERD (gastroesophageal reflux disease)   . Goiter   . Headache(784.0)   . 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 Hyperlipidemia   . Hypertension   . Irritable bowel syndrome   . Labial cyst 04/08/2011   early infection use hotpcompresses    . Migraines   . Ovarian torsion   . Sinusitis   . STD (sexually transmitted disease)    Pos. Chlamydia--in her 30's  . Thyroid disease    goiter    Past Surgical History:  Procedure Laterality Date  . CESAREAN SECTION  2002, 2004   x2  . PELVIC LAPAROSCOPY     x9  . RADIOLOGY WITH ANESTHESIA N/A 09/05/2014   Procedure: MRI OF BRAIN WITHOUT CONTRAST;  Surgeon: Medication Radiologist, MD;  Location: [place];  Service: Radiology;  Laterality: N/A;  MRI/DR. JAFFE  . TUBAL LIGATION    . UNILATERAL SALPINGECTOMY Right 1995   ectopic pregnancy    Allergies: Codeine phosphate; Moxifloxacin; Sulfamethoxazole; and Sumatriptan succinate  Medications: Prior to Admission medications   Medication Sig Start Date End Date Taking? Authorizing Provider  atorvastatin (LIPITOR) 10 MG tablet Take 1 tablet (10 mg total) by mouth daily. 07/29/17  Yes Panosh, Standley Brooking, MD  baclofen (LIORESAL) 10 MG tablet take ONE-HALF to 1 TAB AS NEEDED to TWICE  DAILY, headache. Limit to 2 headache days per week. Avoid daily use. 01/06/16  Yes [provider]  cloNIDine (CATAPRES - DOSED IN MG/24 HR) 0.2 mg/24hr patch Place 1 patch (0.2 mg total) onto the skin once a week. 07/22/17  Yes Panosh, Standley Brooking, MD  fluticasone (FLONASE) 50 MCG/ACT nasal spray Place 1 spray into both nostrils daily. 07/22/17  Yes Billie Ruddy, MD    Galcanezumab-gnlm [facility]) Inject into the skin.   Yes [provider]  megestrol (MEGACE) 20 MG tablet Take 1 tablet (20 mg total) by mouth 3 (three) times daily. 09/30/17  Yes Nunzio Cobbs, MD  Multiple Vitamin (MULTIVITAMIN WITH MINERALS) TABS tablet Take 1 tablet by mouth daily.   Yes [provider]  OnabotulinumtoxinA (BOTOX IJ) Inject as directed every 3 (three) months.   Yes [provider]  pantoprazole (PROTONIX) 40 MG tablet Take 1 tablet (40 mg total) by mouth daily. 07/06/17  Yes Panosh, Standley Brooking, MD  promethazine (PHENERGAN) 25 MG tablet Take 1 tablet (25 mg total) by mouth every 6 (six) hours as needed for nausea or vomiting. 07/08/15  Yes Tomi Likens, Adam R, DO  triamterene-hydrochlorothiazide (MAXZIDE-25) 37.5-25 MG tablet TAKE 1 TABLET BY MOUTH DAILY 10/04/17  Yes Panosh, Standley Brooking, MD  venlafaxine XR (EFFEXOR-XR) 150 MG 24 hr capsule TAKE 1 CAPSULE(150 MG) BY MOUTH DAILY WITH BREAKFAST 10/04/17  Yes Panosh, Standley Brooking, MD  zonisamide (ZONEGRAN) 100 MG capsule Take 400 mg by mouth at bedtime.  04/08/16  Yes [provider]     Family History  Problem Relation Age of Onset  . Anxiety disorder Mother        mother is on xanax and apparently applying for  social security diabilty for her anxiety. t  . Depression Mother   . Diabetes Mother   . Hypertension Mother   . Heart attack Father   . Seizures Father   . Stroke Maternal Grandmother   . Diabetes Paternal Grandmother   . Hypertension Paternal Grandmother   . Coronary artery disease Unknown   . Diabetes Unknown   . Hyperlipidemia Unknown   . Other Unknown        history of child death in family   . Colon cancer Neg Hx   . Stomach cancer Neg Hx   . Esophageal cancer Neg Hx     Social History   Socioeconomic History  . Marital status: Single    Spouse name: Not on file  . Number of children: Not on file  . Years of education: Not on file  . Highest education level: Not on  file  Occupational History  . Not on file  Social Needs  . Financial resource strain: Not on file  . Food insecurity:    Worry: Not on file    Inability: Not on file  . Transportation needs:    Medical: Not on file    Non-medical: Not on file  Tobacco Use  . Smoking status: Never Smoker  . Smokeless tobacco: Never Used  Substance and Sexual Activity  . Alcohol use: No    Alcohol/week: 0.0 standard drinks  . Drug use: No  . Sexual activity: Yes    Partners: Male    Birth control/protection: Surgical    Comment: Tubal  Lifestyle  . Physical activity:    Days per week: Not on file    Minutes per session: Not on file  . Stress: Not on file  Relationships  .  Social connections:    Talks on phone: Not on file    Gets together: Not on file    Attends religious service: Not on file    Active member of club or organization: Not on file    Attends meetings of clubs or organizations: Not on file    Relationship status: Not on file  Other Topics Concern  . 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   And graduated as SW.      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  All other systems are negative.  Review of Systems  Constitutional: Negative for activity change, appetite change and fatigue.  Respiratory: Negative.   Gastrointestinal: Negative.   Genitourinary:  Positive for menstrual problem and pelvic pain. Negative for flank pain.    Vital Signs: BP 113/68   Pulse 90   Temp 98.2 F (36.8 C) (Oral)   Resp 14   Ht 5' 5.5" (1.664 m)   Wt 100.5 kg   LMP 09/28/2017   SpO2 100%   BMI 36.30 kg/m   Physical Exam  Constitutional: She appears well-developed and well-nourished.  Cardiovascular: Normal rate and regular rhythm.  Difficulty palpating the right common femoral arterial pulse due to patient body habitus.  Pulmonary/Chest: Effort normal and breath sounds normal.  Skin: Skin is warm and dry.  Psychiatric: She has a normal mood and affect. Her behavior is normal.  Nursing note and vitals reviewed.    Imaging: No results found.  Labs:  CBC: Recent Labs    06/07/17 1150  WBC 8.4  HGB 13.3  HCT 39.9  PLT 474.0*    COAGS: No results for input(s): INR, APTT in the last 8760 hours.  BMP: Recent Labs    06/07/17 1150  NA 145  K 3.6  CL 104  CO2 30  GLUCOSE 100*  BUN 14  CALCIUM 9.5  CREATININE 0.97    LIVER FUNCTION TESTS: Recent Labs    05/24/17 1500  BILITOT <0.2  AST 12  ALT 10  ALKPHOS 82  PROT 7.6  ALBUMIN 4.7    TUMOR MARKERS: No results for input(s): AFPTM, CEA, CA199, CHROMGRNA in the last 8760 hours.  Assessment and Plan:  [name] is a 47 y.o. (g7, p2) female with past medical history significant for hypertension, hyperlipidemia, thyroid disease, STD (chlamydia and HSV type II), ovarian torsion (post oophorectomy and salpingectomy) and endometriosis who presents today to the interventional radiology clinic for evaluation of percutaneous treatment options for symptomatic uterine fibroids.    Patient's primary fibroid related complaint is in regards to menorrhagia.  Despite attempted pharmacological menstruation suppression, the patient is still experienced 3 breakthrough cycles this year,  all of which were associated with significant menorrhagia.  Continued conservative management and  waiting to the onset of menopause was discussed with the patient, however she is interested in pursuing intervention at this time, however given her history of pelvic inflammatory disease as well as ovarian torsion resulting in salpingectomy and oophorectomy, she has been deemed a poor candidate for hysterectomy as such presents today for potential uterine fibroid embolization.  As such, prolonged conversations were held with the patient regarding the benefits and risks (including but not limited to bleeding, infection and nontarget embolization) of uterine fibroid embolization.  Patient is well-informed regarding this procedure as per her discussions with Dr. Quincy Simmonds and is interested in pursuing this procedure as soon as possible, especially as her insurance lapses at the end of October.  As such, the patient will coordinate undergoing a Pap smear in the coming week.  Note, the patient underwent endometrial biopsy at 10/04/2017 was negative for hyperplasia, atypia or malignancy.  Patient has been scheduled undergo procedural planning contrast-enhanced pelvic MRI on 10/19/2017.  Following the acquisition of the MRI, the fibroid embolization will be scheduled at [facility].  The procedure will entail an overnight admission for continued observation and PCA usage.  Note, while the patient would prefer that I perform the procedure, she is willing to have one of my capable interventional radiology partners perform the procedure if it would entail the procedure being scheduled in a more expeditious manner especially as her insurance lapses at the end of October.  The patient was instructed to call the interventional radiology clinic with any interval questions or concerns.  Thank you for this interesting consult.  I greatly enjoyed meeting [name] and look forward to participating in their care.  A copy of this report was sent to the requesting provider on this date.  Electronically  Signed: Sandi Mariscal 10/11/2017, 11:43 AM   I spent a total of 30 Minutes in face to face in clinical consultation, greater than 50% of which was counseling/coordinating care for symptomatic uterine fibroids.

## 2017-10-17 NOTE — Progress Notes (Deleted)
No chief complaint on file.   HPI: [name] 47 y.o. come in for    For number of issues   Last visit w me 5 19 for PV  ROS: See pertinent positives and negatives per HPI.  Past Medical History:  Diagnosis Date  . Allergic rhinitis    hx of testing and allergic to spring and fall f= pollens  . Anemia    nos  . Anxiety   . Chest pain    neg stress test summer 2010  . Endometriosis   . Endometriosis   . Fibroids   . GERD (gastroesophageal reflux disease)   . Goiter   . Headache(784.0)   . 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 Hyperlipidemia   . Hypertension   . Irritable bowel syndrome   . Labial cyst 04/08/2011   early infection use hotpcompresses    . Migraines   . Ovarian torsion   . Sinusitis   . STD (sexually transmitted disease)    Pos. Chlamydia--in her 20's  . Thyroid disease    goiter    Family History  Problem Relation Age of Onset  . Anxiety disorder Mother        mother is on xanax and apparently applying for  social security diabilty for her anxiety. t  . Depression Mother   . Diabetes Mother   . Hypertension Mother   . Heart attack Father   . Seizures Father   . Stroke Maternal Grandmother   . Diabetes Paternal Grandmother   . Hypertension Paternal Grandmother   . Coronary artery disease Unknown   . Diabetes Unknown   . Hyperlipidemia Unknown   . Other Unknown        history of child death in family   . Colon cancer Neg Hx   . Stomach cancer Neg Hx   . Esophageal cancer Neg Hx     Social History   Socioeconomic History  . Marital status: Single    Spouse name: Not on file  . Number of children: Not on file  . Years of education: Not on file  . Highest education level: Not on file  Occupational History  . Not on file  Social Needs  . Financial resource strain: Not on file  . Food insecurity:    Worry: Not on file    Inability: Not on file  . Transportation needs:    Medical:  Not on file    Non-medical: Not on file  Tobacco Use  . Smoking status: Never Smoker  . Smokeless tobacco: Never Used  Substance and Sexual Activity  . Alcohol use: No    Alcohol/week: 0.0 standard drinks  . Drug use: No  . Sexual activity: Yes    Partners: Male    Birth control/protection: Surgical    Comment: Tubal  Lifestyle  . Physical activity:    Days per week: Not on file    Minutes per session: Not on file  . Stress: Not on file  Relationships  . Social connections:    Talks on phone: Not on file    Gets together: Not on file    Attends religious service: Not on file    Active member of club or organization: Not on file    Attends meetings of clubs or organizations: Not on file    Relationship status: Not on file  Other Topics Concern  . 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   And graduated as SW.      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 atorvastatin (LIPITOR) 10 MG tablet Take 1 tablet (10 mg total) by mouth daily. 30 tablet 2  . baclofen (LIORESAL) 10 MG tablet take ONE-HALF to 1 TAB AS NEEDED to TWICE DAILY, headache. Limit to 2 headache days per week. Avoid daily use.  0  . cloNIDine (CATAPRES - DOSED IN MG/24 HR) 0.2 mg/24hr patch Place 1 patch (0.2 mg total) onto the skin once a week. 4 patch 2  . fluticasone (FLONASE) 50 MCG/ACT nasal spray Place 1 spray into both nostrils daily. 16 g 0  . Galcanezumab-gnlm (EMGALITY [facility])  Inject into the skin.    . megestrol (MEGACE) 20 MG tablet Take 1 tablet (20 mg total) by mouth 3 (three) times daily. 90 tablet 1  . Multiple Vitamin (MULTIVITAMIN WITH MINERALS) TABS tablet Take 1 tablet by mouth daily.    . OnabotulinumtoxinA (BOTOX IJ) Inject as directed every 3 (three) months.    . pantoprazole (PROTONIX) 40 MG tablet Take 1 tablet (40 mg total) by mouth daily. 90 tablet 0  . promethazine (PHENERGAN) 25 MG tablet Take 1 tablet (25 mg total) by mouth every 6 (six) hours as needed for nausea or vomiting. 60 tablet 1  . triamterene-hydrochlorothiazide (MAXZIDE-25) 37.5-25 MG tablet TAKE 1 TABLET BY MOUTH DAILY 90 tablet 0  . venlafaxine XR (EFFEXOR-XR) 150 MG 24 hr capsule TAKE 1 CAPSULE(150 MG) BY MOUTH DAILY WITH BREAKFAST 90 capsule 0  . zonisamide (ZONEGRAN) 100 MG capsule Take 400 mg by mouth at bedtime.   1   No facility-administered medications prior to visit.      EXAM:  LMP 09/28/2017   There is no height or weight on file to calculate BMI.  GENERAL: vitals reviewed and listed above, alert, oriented, appears well hydrated and in no acute distress HEENT: atraumatic, conjunctiva  clear, no obvious abnormalities on inspection of external nose and ears OP : no lesion edema or exudate  NECK: no obvious masses on inspection palpation  LUNGS: clear to auscultation bilaterally, no wheezes, rales or rhonchi, good air movement CV: HRRR, no clubbing cyanosis or  peripheral edema nl cap refill  MS: moves all extremities without noticeable focal  abnormality PSYCH: pleasant and cooperative, no obvious depression or anxiety Lab Results  Component Value Date   WBC 8.4 06/07/2017   HGB 13.3 06/07/2017   HCT 39.9 06/07/2017   PLT 474.0 (H) 06/07/2017   GLUCOSE 100 (H) 06/07/2017   CHOL 264 (H) 07/21/2017   TRIG 147.0 07/21/2017   HDL 42.70 07/21/2017   LDLDIRECT 167.0 06/07/2017   LDLCALC 192 (H) 07/21/2017   ALT 10 05/24/2017   AST 12 05/24/2017  NA 145  06/07/2017   K 3.6 06/07/2017   CL 104 06/07/2017   CREATININE 0.97 06/07/2017   BUN 14 06/07/2017   CO2 30 06/07/2017   TSH 1.40 07/21/2017   HGBA1C 6.0 06/07/2017   BP Readings from Last 3 Encounters:  10/11/17 113/68  09/30/17 110/78  08/01/17 114/60    ASSESSMENT AND PLAN:  Discussed the following assessment and plan:  No diagnosis found.  -Patient advised to return or notify health care team  if  new concerns arise.  There are no Patient Instructions on file for this visit.   Standley Brooking. Aurore Redinger M.D.

## 2017-10-18 ENCOUNTER — Ambulatory Visit: Payer: BLUE CROSS/BLUE SHIELD | Admitting: Internal Medicine

## 2017-10-19 ENCOUNTER — Ambulatory Visit (HOSPITAL_COMMUNITY)
Admission: RE | Admit: 2017-10-19 | Discharge: 2017-10-19 | Disposition: A | Discharge status: Home / Self Care | Payer: BLUE CROSS/BLUE SHIELD | Source: Ambulatory Visit | Attending: Interventional Radiology | Admitting: Interventional Radiology

## 2017-10-19 DIAGNOSIS — D219 Benign neoplasm of connective and other soft tissue, unspecified: Secondary | ICD-10-CM | POA: Diagnosis present

## 2017-10-19 DIAGNOSIS — D259 Leiomyoma of uterus, unspecified: Secondary | ICD-10-CM | POA: Insufficient documentation

## 2017-10-19 MED ORDER — GADOBUTROL 1 MMOL/ML IV SOLN
10.0000 mL | Freq: Once | INTRAVENOUS | Status: AC | PRN
  Administered 2017-10-19: 10 mL via INTRAVENOUS

## 2017-10-20 LAB — POCT I-STAT CREATININE: Creatinine, Ser: 1 mg/dL (ref 0.44–1.00)

## 2017-10-21 ENCOUNTER — Ambulatory Visit (INDEPENDENT_AMBULATORY_CARE_PROVIDER_SITE_OTHER): Payer: BLUE CROSS/BLUE SHIELD | Admitting: Obstetrics and Gynecology

## 2017-10-21 ENCOUNTER — Other Ambulatory Visit (HOSPITAL_COMMUNITY)
Admission: RE | Admit: 2017-10-21 | Discharge: 2017-10-21 | Disposition: A | Discharge status: Home / Self Care | Payer: BLUE CROSS/BLUE SHIELD | Source: Ambulatory Visit | Attending: Obstetrics and Gynecology | Admitting: Obstetrics and Gynecology

## 2017-10-21 VITALS — BP 110/72 | HR 66 | Resp 14 | Ht 65.0 in | Wt 225.0 lb

## 2017-10-21 DIAGNOSIS — Z01419 Encounter for gynecological examination (general) (routine) without abnormal findings: Secondary | ICD-10-CM

## 2017-10-21 DIAGNOSIS — Z1211 Encounter for screening for malignant neoplasm of colon: Secondary | ICD-10-CM

## 2017-10-21 DIAGNOSIS — N870 Mild cervical dysplasia: Secondary | ICD-10-CM | POA: Diagnosis not present
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
## 2017-10-21 NOTE — Patient Instructions (Signed)

## 2017-10-23 ENCOUNTER — Encounter: Payer: Self-pay | Admitting: Obstetrics and Gynecology

## 2017-10-24 ENCOUNTER — Other Ambulatory Visit (HOSPITAL_COMMUNITY): Payer: Self-pay | Admitting: Interventional Radiology

## 2017-10-24 DIAGNOSIS — D259 Leiomyoma of uterus, unspecified: Secondary | ICD-10-CM

## 2017-10-26 ENCOUNTER — Ambulatory Visit: Payer: BLUE CROSS/BLUE SHIELD | Admitting: Obstetrics and Gynecology

## 2017-10-26 LAB — CYTOLOGY - PAP: HPV (WINDOPATH): NOT DETECTED

## 2017-10-27 ENCOUNTER — Ambulatory Visit: Payer: BLUE CROSS/BLUE SHIELD

## 2017-10-28 ENCOUNTER — Ambulatory Visit: Payer: BLUE CROSS/BLUE SHIELD

## 2017-10-28 ENCOUNTER — Telehealth: Payer: Self-pay | Admitting: Emergency Medicine

## 2017-10-28 NOTE — Telephone Encounter (Signed)
-----   Message from Nunzio Cobbs, MD sent at 10/28/2017  9:34 AM EDT ----- Please contact patient regarding her pap showing LGSIL and negative HR HPV.  I am recommending a colposcopy.  The patient is planning a uterine artery embolization for 11/11/17 due to an upcoming insurance change, so the colposcopy will need to be scheduled very quickly.  I would suggest doing the procedure on 10/31/17 or 11/01/17.

## 2017-10-31 ENCOUNTER — Other Ambulatory Visit: Payer: Self-pay | Admitting: *Deleted

## 2017-10-31 DIAGNOSIS — R87612 Low grade squamous intraepithelial lesion on cytologic smear of cervix (LGSIL): Secondary | ICD-10-CM

## 2017-10-31 NOTE — Progress Notes (Deleted)
GYNECOLOGY  VISIT   HPI: 47 y.o.   Single  African American  female   [phone] with No LMP recorded.   here for   Colposcopy for LGSIL, negative HR HPV   GYNECOLOGIC HISTORY: No LMP recorded. Contraception:  *** Menopausal hormone therapy:  *** Last mammogram:  *** Last pap smear:   ***        OB History    Gravida  7   Para  2   Term      Preterm      AB  5   Living  2     SAB  1   TAB  1   Ectopic  3   Multiple      Live Births           Obstetric Comments  3 ectopic           Patient Active Problem List   Diagnosis Date Noted  . Depression 06/07/2017  . Bilateral carpal tunnel syndrome 01/20/2017  . Left sided numbness 01/20/2017  . Morbid obesity ([place]) 01/30/2015  . 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 RUQ pain 02/25/2014  . Diarrhea 02/25/2014  . Calcium oxalate crystals in urine 02/22/2014  . Intramural leiomyoma of uterus 02/22/2014  . Hx of migraine headaches 02/04/2014  . Medication management 02/04/2014  . Hx of endometriosis 02/04/2014  . Right lower quadrant abdominal pain mid also 02/04/2014  . Adjustment disorder with anxious mood 01/17/2014  . Thyroid mass 02/16/2013  . Fever, unspecified 02/15/2013  . Sinusitis 02/15/2013  . Anxiety 12/11/2011  . Stress 12/11/2011  . Irregular periods 08/17/2011  . Endometriosis 08/17/2011  . Edema 05/10/2011  . Elevated blood pressure reading 05/10/2011  . Fatigue 04/08/2011  . Upper respiratory tract infection 04/08/2011  . SINUSITIS, RECURRENT 11/06/2009  . FATIGUE 02/25/2009  . PALPITATIONS 02/25/2009  . DYSPNEA 02/25/2009  . KNEE INJURY, RIGHT 12/05/2007  . HEADACHE 05/03/2007  . ADULT SITUATIONAL REACTION 11/21/2006  . SLEEPLESSNESS 11/21/2006  . CHEST PAIN 11/21/2006  . TINEA PEDIS 08/31/2006  . Other and unspecified hyperlipidemia 08/31/2006  . DISORDER, ADJUSTMENT W/ANXIETY 08/31/2006  . PLANTAR FASCIITIS 08/31/2006  . ANEMIA-NOS  06/23/2006  . MIGRAINE, COMMON 06/23/2006  . ALLERGIC RHINITIS 06/23/2006  . GERD 06/23/2006  . IRRITABLE BOWEL SYNDROME 06/23/2006    Past Medical History:  Diagnosis Date  . Allergic rhinitis    hx of testing and allergic to spring and fall f= pollens  . Anemia    nos  . Anxiety   . Chest pain    neg stress test summer 2010  . Endometriosis   . Endometriosis   . Fibroids   . GERD (gastroesophageal reflux disease)   . Goiter   . Headache(784.0)   . 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 Hyperlipidemia   . Hypertension   . Irritable bowel syndrome   . Labial cyst 04/08/2011   early infection use hotpcompresses    . Migraines   . Ovarian torsion   . Sinusitis   . STD (sexually transmitted disease)    Pos. Chlamydia--in her 10's  . Thyroid disease    goiter    Past Surgical History:  Procedure Laterality Date  . CESAREAN SECTION  2002, 2004   x2  . IR RADIOLOGIST EVAL & MGMT  10/11/2017  . PELVIC LAPAROSCOPY     x9  . RADIOLOGY WITH ANESTHESIA N/A 09/05/2014  Procedure: MRI OF BRAIN WITHOUT CONTRAST;  Surgeon: Medication Radiologist, MD;  Location: [facility];  Service: Radiology;  Laterality: N/A;  MRI/DR. JAFFE  . TUBAL LIGATION    . UNILATERAL SALPINGECTOMY Right 1995   ectopic pregnancy    Current Outpatient Medications  Medication Sig Dispense Refill  . atorvastatin (LIPITOR) 10 MG tablet Take 1 tablet (10 mg total) by mouth daily. 30 tablet 2  . baclofen (LIORESAL) 10 MG tablet take ONE-HALF to 1 TAB AS NEEDED to TWICE DAILY, headache. Limit to 2 headache days per week. Avoid daily use.  0  . cloNIDine (CATAPRES - DOSED IN MG/24 HR) 0.2 mg/24hr patch Place 1 patch (0.2 mg total) onto the skin once a week. 4 patch 2  . fluticasone (FLONASE) 50 MCG/ACT nasal spray Place 1 spray into both nostrils daily. 16 g 0  . Galcanezumab-gnlm (EMGALITY [place]) Inject into the skin.    . megestrol (MEGACE) 20 MG tablet Take 1 tablet  (20 mg total) by mouth 3 (three) times daily. 90 tablet 1  . Multiple Vitamin (MULTIVITAMIN WITH MINERALS) TABS tablet Take 1 tablet by mouth daily.    . OnabotulinumtoxinA (BOTOX IJ) Inject as directed every 3 (three) months.    . pantoprazole (PROTONIX) 40 MG tablet Take 1 tablet (40 mg total) by mouth daily. 90 tablet 0  . promethazine (PHENERGAN) 25 MG tablet Take 1 tablet (25 mg total) by mouth every 6 (six) hours as needed for nausea or vomiting. 60 tablet 1  . triamterene-hydrochlorothiazide (MAXZIDE-25) 37.5-25 MG tablet TAKE 1 TABLET BY MOUTH DAILY 90 tablet 0  . venlafaxine XR (EFFEXOR-XR) 150 MG 24 hr capsule TAKE 1 CAPSULE(150 MG) BY MOUTH DAILY WITH BREAKFAST 90 capsule 0  . zonisamide (ZONEGRAN) 100 MG capsule Take 400 mg by mouth at bedtime.   1   No current facility-administered medications for this visit.      ALLERGIES: Codeine phosphate; Moxifloxacin; Sulfamethoxazole; and Sumatriptan succinate  Family History  Problem Relation Age of Onset  . Anxiety disorder Mother        mother is on xanax and apparently applying for  social security diabilty for her anxiety. t  . Depression Mother   . Diabetes Mother   . Hypertension Mother   . Heart attack Father   . Seizures Father   . Stroke Maternal Grandmother   . Diabetes Paternal Grandmother   . Hypertension Paternal Grandmother   . Coronary artery disease Unknown   . Diabetes Unknown   . Hyperlipidemia Unknown   . Other Unknown        history of child death in family   . Colon cancer Neg Hx   . Stomach cancer Neg Hx   . Esophageal cancer Neg Hx     Social History   Socioeconomic History  . Marital status: Single    Spouse name: Not on file  . Number of children: Not on file  . Years of education: Not on file  . Highest education level: Not on file  Occupational History  . Not on file  Social Needs  . Financial resource strain: Not on file  . Food insecurity:    Worry: Not on file    Inability: Not on  file  . Transportation needs:    Medical: Not on file    Non-medical: Not on file  Tobacco Use  . Smoking status: Never Smoker  . Smokeless tobacco: Never Used  Substance and Sexual Activity  . Alcohol use: No  Alcohol/week: 0.0 standard drinks  . Drug use: No  . Sexual activity: Yes    Partners: Male    Birth control/protection: Surgical    Comment: Tubal  Lifestyle  . Physical activity:    Days per week: Not on file    Minutes per session: Not on file  . Stress: Not on file  Relationships  . Social connections:    Talks on phone: Not on file    Gets together: Not on file    Attends religious service: Not on file    Active member of club or organization: Not on file    Attends meetings of clubs or organizations: Not on file    Relationship status: Not on file  . Intimate partner violence:    Fear of current or ex partner: Not on file    Emotionally abused: Not on file    Physically abused: Not on file    Forced sexual activity: Not on file  Other Topics Concern  . 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   And graduated as SW.      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    General appearance: alert, cooperative and appears stated age Head: Normocephalic, without obvious abnormality, atraumatic Neck: no adenopathy, supple, symmetrical, trachea midline and thyroid normal to inspection and palpation Lungs: clear to  auscultation bilaterally Breasts: normal appearance, no masses or tenderness, No nipple retraction or dimpling, No nipple discharge or bleeding, No axillary or supraclavicular adenopathy Heart: regular rate and rhythm Abdomen: soft, non-tender, no masses,  no organomegaly Extremities: extremities normal, atraumatic, no cyanosis or edema Skin: Skin color, texture, turgor normal. No rashes or lesions Lymph nodes: Cervical, supraclavicular, and axillary nodes normal. No abnormal inguinal nodes palpated Neurologic: Grossly normal  Pelvic: External genitalia:  no lesions              Urethra:  normal appearing urethra with no masses, tenderness or lesions              Bartholins and Skenes: normal                 Vagina: normal appearing vagina with normal color and discharge, no lesions              Cervix: no lesions                Bimanual Exam:  Uterus:  normal size, contour, position, consistency, mobility, non-tender              Adnexa: no mass, fullness, tenderness              Rectal exam: {yes no:314532}.  Confirms.  Anus:  normal sphincter tone, no lesions  Chaperone was present for exam.  ASSESSMENT     PLAN     An After Visit Summary was printed and given to the patient.  ______ minutes face to face time of which over 50% was spent in counseling.   

## 2017-11-01 ENCOUNTER — Ambulatory Visit (INDEPENDENT_AMBULATORY_CARE_PROVIDER_SITE_OTHER): Payer: BLUE CROSS/BLUE SHIELD | Admitting: Obstetrics and Gynecology

## 2017-11-01 ENCOUNTER — Encounter: Payer: Self-pay | Admitting: Obstetrics and Gynecology

## 2017-11-01 ENCOUNTER — Other Ambulatory Visit: Payer: Self-pay

## 2017-11-01 VITALS — BP 112/60 | HR 104 | Resp 16 | Ht 65.5 in | Wt 222.4 lb

## 2017-11-01 DIAGNOSIS — R21 Rash and other nonspecific skin eruption: Secondary | ICD-10-CM | POA: Diagnosis not present

## 2017-11-01 DIAGNOSIS — R102 Pelvic and perineal pain: Secondary | ICD-10-CM

## 2017-11-01 DIAGNOSIS — N898 Other specified noninflammatory disorders of vagina: Secondary | ICD-10-CM | POA: Diagnosis not present

## 2017-11-01 DIAGNOSIS — Z01812 Encounter for preprocedural laboratory examination: Secondary | ICD-10-CM

## 2017-11-01 DIAGNOSIS — R87612 Low grade squamous intraepithelial lesion on cytologic smear of cervix (LGSIL): Secondary | ICD-10-CM

## 2017-11-01 LAB — POCT URINE PREGNANCY: Preg Test, Ur: NEGATIVE

## 2017-11-01 MED ORDER — METRONIDAZOLE 500 MG PO TABS: 500.0000 mg | ORAL_TABLET | Freq: Two times a day (BID) | ORAL | 0 refills | Status: DC

## 2017-11-01 NOTE — Patient Instructions (Signed)

## 2017-11-01 NOTE — Progress Notes (Signed)
  Subjective:     Patient ID: [name], female   DOB: 1970/03/18, 47 y.o.   MRN: [id number]  HPI  Pap history:  10-21-17 LGSIL, HR HPV negative  10-19-16 negative, HR HPV negative  09-10-13 negative, HR HPV negative   Having some vaginal odor.   Having some right sided pain and thinks this is her period week.  She is on Megace and has uterine artery embolization scheduled for next week.   Thinks she has an allergic reaction to Clonidine.  Was using this for hot flashes due to the [place].   She may be losing her insurance.   Review of Systems  Constitutional: Negative.   HENT: Negative.   Respiratory: Negative.   Cardiovascular: Negative.   Gastrointestinal: Positive for abdominal distention.  Endocrine: Positive for cold intolerance and heat intolerance.  Genitourinary: Negative.   Musculoskeletal: Negative.   Skin: Negative.   Allergic/Immunologic: Negative.   Neurological: Positive for headaches.  Hematological: Negative.   Psychiatric/Behavioral: Negative.     LMP: on megace Contraception: BTL UPT: negative      Objective:   Physical Exam  Genitourinary:    Colposcopy - cervix, vagina. Consent for procedure.  3% acetic acid used in vagina and on vulva. White light and green light filter used.  Colposcopy satisfactory:  Yes   _x____          No    _____ Findings:   Cervix with tight opening at the os.  Os finder used to minimally dilate the cervix and endocervical speculum used.   Cervix:  Small acetowhite change at 6:00.  Vagina: no lesions noted. Biopsies:   exoecervix at 6:00 and ECC sent to pathology separately. AgNO3 placed on cervix at 6:00. Monsel's placed.  Minimal EBL. No complications.   Pelvic exam - no odor or discharge noted.  Affirm taken.  Skin - anular rash on chest and back consistent with reaction to clonidine patch.     Assessment:     LGSIL pap and negative HR HPV.  Pelvic pain.  Currently on Megace.  Fibroid uterus.   Vaginal odor.  Skin rash and allergy to Clonidine.     Plan:      Affirm done.  Will start Flagyl 500 mg po bid x 7 days.  Rush on biopsies.  I clearly told her that she may be at risk of infection if she has BV and has the colposcopy today.  We do not have an alternative to delay due to her upcoming procedure. I have given her infection precautions.  Stop Megace on the day of her embolization.  If she needs tx for pelvic pain/menstrual pain following embolization, will restart [place].  Of course she would need to be evaluated for endometritis first if this is soon after the embolization is done.  Hydrocortisone cream to rash areas.    __15_____ minutes face to face time of which over 50% was spent in counseling.

## 2017-11-02 ENCOUNTER — Other Ambulatory Visit: Payer: Self-pay | Admitting: Internal Medicine

## 2017-11-02 ENCOUNTER — Other Ambulatory Visit: Payer: BLUE CROSS/BLUE SHIELD

## 2017-11-02 LAB — VAGINITIS/VAGINOSIS, DNA PROBE
CANDIDA SPECIES: NEGATIVE
Gardnerella vaginalis: POSITIVE — AB
Trichomonas vaginosis: NEGATIVE

## 2017-11-07 ENCOUNTER — Telehealth: Payer: Self-pay | Admitting: Internal Medicine

## 2017-11-07 NOTE — Telephone Encounter (Signed)
Pt notified to have lab work done at Dr. Deniece Portela office as Dr. Regis Bill is not here to to authorize lab work from another office.  Pt agreed.

## 2017-11-07 NOTE — Telephone Encounter (Signed)
Copied from [place] [id number]. Topic: Quick Communication - See Telephone Encounter >> Nov 07, 2017  4:04 PM Vernona Rieger wrote: CRM for notification. See Telephone encounter for: 11/07/17.  Patient states she will be having a procedure done this Friday, October 19th at the hospital. She wants to know could she have her blood work done there (lipid panel and TSH) please advise. She said Dr Weyman Rodney is doing her procedure.

## 2017-11-10 ENCOUNTER — Other Ambulatory Visit: Payer: Self-pay | Admitting: Student

## 2017-11-10 NOTE — Telephone Encounter (Signed)
Ok to stop the patch  But monitor  bp  for rebound  High bp that should resolve   Over time    [facility] to make appt to discuss if getting complicated

## 2017-11-11 ENCOUNTER — Encounter (HOSPITAL_COMMUNITY): Payer: Self-pay

## 2017-11-11 ENCOUNTER — Ambulatory Visit (HOSPITAL_COMMUNITY)
Admission: RE | Admit: 2017-11-11 | Discharge: 2017-11-11 | Disposition: A | Discharge status: Home / Self Care | Payer: BLUE CROSS/BLUE SHIELD | Source: Ambulatory Visit | Attending: Interventional Radiology | Admitting: Interventional Radiology

## 2017-11-11 ENCOUNTER — Other Ambulatory Visit: Payer: Self-pay

## 2017-11-11 ENCOUNTER — Observation Stay (HOSPITAL_COMMUNITY)
Admission: RE | Admit: 2017-11-11 | Discharge: 2017-11-12 | Disposition: A | Discharge status: Home / Self Care | Payer: BLUE CROSS/BLUE SHIELD | Source: Ambulatory Visit | Attending: Interventional Radiology | Admitting: Interventional Radiology

## 2017-11-11 ENCOUNTER — Other Ambulatory Visit (HOSPITAL_COMMUNITY): Payer: Self-pay | Admitting: Diagnostic Radiology

## 2017-11-11 ENCOUNTER — Other Ambulatory Visit: Payer: Self-pay | Admitting: Radiology

## 2017-11-11 DIAGNOSIS — Z7951 Long term (current) use of inhaled steroids: Secondary | ICD-10-CM | POA: Diagnosis not present

## 2017-11-11 DIAGNOSIS — K219 Gastro-esophageal reflux disease without esophagitis: Secondary | ICD-10-CM | POA: Diagnosis not present

## 2017-11-11 DIAGNOSIS — Z882 Allergy status to sulfonamides status: Secondary | ICD-10-CM | POA: Insufficient documentation

## 2017-11-11 DIAGNOSIS — N809 Endometriosis, unspecified: Secondary | ICD-10-CM | POA: Diagnosis not present

## 2017-11-11 DIAGNOSIS — D219 Benign neoplasm of connective and other soft tissue, unspecified: Secondary | ICD-10-CM

## 2017-11-11 DIAGNOSIS — E079 Disorder of thyroid, unspecified: Secondary | ICD-10-CM | POA: Insufficient documentation

## 2017-11-11 DIAGNOSIS — Z885 Allergy status to narcotic agent status: Secondary | ICD-10-CM | POA: Diagnosis not present

## 2017-11-11 DIAGNOSIS — N739 Female pelvic inflammatory disease, unspecified: Secondary | ICD-10-CM | POA: Diagnosis not present

## 2017-11-11 DIAGNOSIS — I10 Essential (primary) hypertension: Secondary | ICD-10-CM | POA: Insufficient documentation

## 2017-11-11 DIAGNOSIS — E785 Hyperlipidemia, unspecified: Secondary | ICD-10-CM | POA: Insufficient documentation

## 2017-11-11 DIAGNOSIS — D250 Submucous leiomyoma of uterus: Secondary | ICD-10-CM | POA: Diagnosis not present

## 2017-11-11 DIAGNOSIS — D259 Leiomyoma of uterus, unspecified: Secondary | ICD-10-CM | POA: Diagnosis present

## 2017-11-11 DIAGNOSIS — N920 Excessive and frequent menstruation with regular cycle: Secondary | ICD-10-CM | POA: Insufficient documentation

## 2017-11-11 DIAGNOSIS — G43909 Migraine, unspecified, not intractable, without status migrainosus: Secondary | ICD-10-CM | POA: Insufficient documentation

## 2017-11-11 DIAGNOSIS — F419 Anxiety disorder, unspecified: Secondary | ICD-10-CM | POA: Insufficient documentation

## 2017-11-11 DIAGNOSIS — Z8249 Family history of ischemic heart disease and other diseases of the circulatory system: Secondary | ICD-10-CM | POA: Insufficient documentation

## 2017-11-11 DIAGNOSIS — D252 Subserosal leiomyoma of uterus: Secondary | ICD-10-CM | POA: Insufficient documentation

## 2017-11-11 DIAGNOSIS — K589 Irritable bowel syndrome without diarrhea: Secondary | ICD-10-CM | POA: Diagnosis not present

## 2017-11-11 HISTORY — PX: IR ANGIOGRAM SELECTIVE EACH ADDITIONAL VESSEL: IMG667

## 2017-11-11 HISTORY — PX: IR EMBO TUMOR ORGAN ISCHEMIA INFARCT INC GUIDE ROADMAPPING: IMG5449

## 2017-11-11 HISTORY — PX: IR US GUIDE VASC ACCESS RIGHT: IMG2390

## 2017-11-11 HISTORY — PX: IR ANGIOGRAM PELVIS SELECTIVE OR SUPRASELECTIVE: IMG661

## 2017-11-11 LAB — CBC WITH DIFFERENTIAL/PLATELET
ABS IMMATURE GRANULOCYTES: 0.02 10*3/uL (ref 0.00–0.07)
BASOS PCT: 1 %
Basophils Absolute: 0.1 10*3/uL (ref 0.0–0.1)
Eosinophils Absolute: 0.2 10*3/uL (ref 0.0–0.5)
Eosinophils Relative: 2 %
HCT: 38.2 % (ref 36.0–46.0)
Hemoglobin: 12.4 g/dL (ref 12.0–15.0)
IMMATURE GRANULOCYTES: 0 %
Lymphocytes Relative: 46 %
Lymphs Abs: 3.9 10*3/uL (ref 0.7–4.0)
MCH: 29.2 pg (ref 26.0–34.0)
MCHC: 32.5 g/dL (ref 30.0–36.0)
MCV: 89.9 fL (ref 80.0–100.0)
Monocytes Absolute: 0.5 10*3/uL (ref 0.1–1.0)
Monocytes Relative: 6 %
NEUTROS ABS: 3.8 10*3/uL (ref 1.7–7.7)
NEUTROS PCT: 45 %
PLATELETS: 471 10*3/uL — AB (ref 150–400)
RBC: 4.25 MIL/uL (ref 3.87–5.11)
RDW: 14 % (ref 11.5–15.5)
WBC: 8.4 10*3/uL (ref 4.0–10.5)
nRBC: 0 % (ref 0.0–0.2)

## 2017-11-11 LAB — BASIC METABOLIC PANEL
ANION GAP: 9 (ref 5–15)
BUN: 11 mg/dL (ref 6–20)
CO2: 20 mmol/L — ABNORMAL LOW (ref 22–32)
Calcium: 9 mg/dL (ref 8.9–10.3)
Chloride: 114 mmol/L — ABNORMAL HIGH (ref 98–111)
Creatinine, Ser: 0.89 mg/dL (ref 0.44–1.00)
GFR calc Af Amer: >60 mL/min (ref >60)
GLUCOSE: 111 mg/dL — AB (ref 70–99)
POTASSIUM: 3.1 mmol/L — AB (ref 3.5–5.1)
SODIUM: 143 mmol/L (ref 135–145)

## 2017-11-11 LAB — PROTIME-INR
INR: 1.01
PROTHROMBIN TIME: 13.3 s (ref 11.4–15.2)

## 2017-11-11 LAB — APTT: aPTT: 27 seconds (ref 24–36)

## 2017-11-11 MED ORDER — FENTANYL CITRATE (PF) 100 MCG/2ML IJ SOLN
INTRAMUSCULAR | Status: AC | PRN
  Administered 2017-11-11 (×4): 50 ug via INTRAVENOUS

## 2017-11-11 MED ORDER — NALOXONE HCL 0.4 MG/ML IJ SOLN: 0.4000 mg | INTRAMUSCULAR | Status: DC | PRN

## 2017-11-11 MED ORDER — MIDAZOLAM HCL 2 MG/2ML IJ SOLN
INTRAMUSCULAR | Status: AC
  Filled 2017-11-11: qty 6

## 2017-11-11 MED ORDER — DOCUSATE SODIUM 100 MG PO CAPS
100.0000 mg | ORAL_CAPSULE | Freq: Two times a day (BID) | ORAL | Status: DC
  Administered 2017-11-11: 100 mg via ORAL
  Filled 2017-11-11: qty 1

## 2017-11-11 MED ORDER — PANTOPRAZOLE SODIUM 40 MG PO TBEC
40.0000 mg | DELAYED_RELEASE_TABLET | Freq: Every day | ORAL | Status: DC
  Administered 2017-11-11: 40 mg via ORAL
  Filled 2017-11-11: qty 1

## 2017-11-11 MED ORDER — INFLUENZA VAC SPLIT QUAD 0.5 ML IM SUSY: 0.5000 mL | PREFILLED_SYRINGE | INTRAMUSCULAR | Status: DC

## 2017-11-11 MED ORDER — ZONISAMIDE 100 MG PO CAPS
100.0000 mg | ORAL_CAPSULE | Freq: Every day | ORAL | Status: DC
  Filled 2017-11-11: qty 1

## 2017-11-11 MED ORDER — MIDAZOLAM HCL 2 MG/2ML IJ SOLN
INTRAMUSCULAR | Status: AC | PRN
  Administered 2017-11-11 (×6): 1 mg via INTRAVENOUS

## 2017-11-11 MED ORDER — KETOROLAC TROMETHAMINE 30 MG/ML IJ SOLN
30.0000 mg | Freq: Once | INTRAMUSCULAR | Status: AC
  Administered 2017-11-11: 30 mg via INTRAVENOUS
  Filled 2017-11-11: qty 1

## 2017-11-11 MED ORDER — METRONIDAZOLE 500 MG PO TABS
500.0000 mg | ORAL_TABLET | Freq: Two times a day (BID) | ORAL | Status: DC
  Administered 2017-11-11: 500 mg via ORAL
  Filled 2017-11-11: qty 1

## 2017-11-11 MED ORDER — ATORVASTATIN CALCIUM 10 MG PO TABS
10.0000 mg | ORAL_TABLET | Freq: Every day | ORAL | Status: DC
  Administered 2017-11-11: 10 mg via ORAL
  Filled 2017-11-11: qty 1

## 2017-11-11 MED ORDER — SODIUM CHLORIDE 0.9 % IV SOLN: 250.0000 mL | INTRAVENOUS | Status: DC | PRN

## 2017-11-11 MED ORDER — SODIUM CHLORIDE 0.9 % IV SOLN: INTRAVENOUS | Status: DC

## 2017-11-11 MED ORDER — SODIUM CHLORIDE 0.9% FLUSH: 3.0000 mL | Freq: Two times a day (BID) | INTRAVENOUS | Status: DC

## 2017-11-11 MED ORDER — IOHEXOL 300 MG/ML  SOLN
100.0000 mL | Freq: Once | INTRAMUSCULAR | Status: AC | PRN
  Administered 2017-11-11: 70 mL via INTRA_ARTERIAL

## 2017-11-11 MED ORDER — CEFAZOLIN SODIUM-DEXTROSE 2-4 GM/100ML-% IV SOLN
2.0000 g | Freq: Once | INTRAVENOUS | Status: AC
  Administered 2017-11-11: 2 g via INTRAVENOUS

## 2017-11-11 MED ORDER — LIDOCAINE HCL 1 % IJ SOLN
INTRAMUSCULAR | Status: AC
  Filled 2017-11-11: qty 20

## 2017-11-11 MED ORDER — LIDOCAINE HCL (PF) 1 % IJ SOLN
INTRAMUSCULAR | Status: AC | PRN
  Administered 2017-11-11: 5 mL

## 2017-11-11 MED ORDER — CEFAZOLIN SODIUM-DEXTROSE 2-4 GM/100ML-% IV SOLN
INTRAVENOUS | Status: AC
  Administered 2017-11-11: 2 g via INTRAVENOUS
  Filled 2017-11-11: qty 100

## 2017-11-11 MED ORDER — FENTANYL CITRATE (PF) 100 MCG/2ML IJ SOLN
INTRAMUSCULAR | Status: AC
  Filled 2017-11-11: qty 4

## 2017-11-11 MED ORDER — DIPHENHYDRAMINE HCL 50 MG/ML IJ SOLN
12.5000 mg | Freq: Four times a day (QID) | INTRAMUSCULAR | Status: DC | PRN
  Administered 2017-11-11 – 2017-11-12 (×2): 12.5 mg via INTRAVENOUS
  Filled 2017-11-11 (×2): qty 1

## 2017-11-11 MED ORDER — SODIUM CHLORIDE 0.9% FLUSH: 9.0000 mL | INTRAVENOUS | Status: DC | PRN

## 2017-11-11 MED ORDER — IOHEXOL 300 MG/ML  SOLN
100.0000 mL | Freq: Once | INTRAMUSCULAR | Status: AC | PRN
  Administered 2017-11-11: 14 mL via INTRA_ARTERIAL

## 2017-11-11 MED ORDER — ZONISAMIDE 100 MG PO CAPS
300.0000 mg | ORAL_CAPSULE | Freq: Every day | ORAL | Status: DC
  Administered 2017-11-11: 300 mg via ORAL
  Filled 2017-11-11: qty 3

## 2017-11-11 MED ORDER — TRIAMTERENE-HCTZ 37.5-25 MG PO TABS
1.0000 | ORAL_TABLET | Freq: Every day | ORAL | Status: DC
  Administered 2017-11-11: 1 via ORAL
  Filled 2017-11-11 (×2): qty 1

## 2017-11-11 MED ORDER — SODIUM CHLORIDE 0.9% FLUSH: 3.0000 mL | INTRAVENOUS | Status: DC | PRN

## 2017-11-11 MED ORDER — ONDANSETRON HCL 4 MG/2ML IJ SOLN: 4.0000 mg | Freq: Four times a day (QID) | INTRAMUSCULAR | Status: DC | PRN

## 2017-11-11 MED ORDER — DIPHENHYDRAMINE HCL 12.5 MG/5ML PO ELIX
12.5000 mg | ORAL_SOLUTION | Freq: Four times a day (QID) | ORAL | Status: DC | PRN
  Filled 2017-11-11: qty 5

## 2017-11-11 MED ORDER — VENLAFAXINE HCL ER 150 MG PO CP24
150.0000 mg | ORAL_CAPSULE | Freq: Every day | ORAL | Status: DC
  Administered 2017-11-11: 150 mg via ORAL
  Filled 2017-11-11: qty 1

## 2017-11-11 MED ORDER — HYDROMORPHONE 1 MG/ML IV SOLN
INTRAVENOUS | Status: DC
  Administered 2017-11-11: 16:00:00 via INTRAVENOUS
  Administered 2017-11-11: 2.7 mg via INTRAVENOUS
  Administered 2017-11-12: 1.5 mg via INTRAVENOUS
  Administered 2017-11-12: 2.7 mg via INTRAVENOUS
  Administered 2017-11-12: 2.4 mg via INTRAVENOUS
  Filled 2017-11-11: qty 25

## 2017-11-11 MED ORDER — MEGESTROL ACETATE 20 MG PO TABS
20.0000 mg | ORAL_TABLET | Freq: Three times a day (TID) | ORAL | Status: DC
  Filled 2017-11-11 (×2): qty 1

## 2017-11-11 NOTE — Procedures (Signed)
Pre-procedure Diagnosis: Symptomatic uterine fibroids Post-procedure Diagnosis: Same  Post bilateral uterine artery embolization.    Complications: None Immediate  EBL: None  Keep right leg straight for 4 hrs (until 20:00).    SignedSandi Mariscal Pager: [phone] 11/11/2017, 4:01 PM

## 2017-11-11 NOTE — H&P (Signed)
Chief Complaint: Patient was seen in consultation today for symptomatic uterine fibroids at the request of Dr. Gala Romney  Referring Physician(s): Dr. Gala Romney  Supervising Physician: Sandi Mariscal  Patient Status: [facility] - Out-pt  History of Present Illness: [name] is a 47 y.o. female scheduled today for uterine fibroid embolization. She was seen in consultation by Dr. Pascal Lux to discuss the procedure and has agreed to move forward. PMHx, meds, labs, imaging, allergies reviewed. Feels well, no recent fevers, chills, illness. Has been NPO today as directed.   Past Medical History:  Diagnosis Date  . Allergic rhinitis    hx of testing and allergic to spring and fall f= pollens  . Anemia    nos  . Anxiety   . Chest pain    neg stress test summer 2010  . Endometriosis   . Endometriosis   . Fibroids   . GERD (gastroesophageal reflux disease)   . Goiter   . Headache(784.0)   . 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 Hyperlipidemia   . Hypertension   . Irritable bowel syndrome   . Labial cyst 04/08/2011   early infection use hotpcompresses    . Migraines   . Ovarian torsion   . Sinusitis   . STD (sexually transmitted disease)    Pos. Chlamydia--in her 40's  . Thyroid disease    goiter    Past Surgical History:  Procedure Laterality Date  . CESAREAN SECTION  2002, 2004   x2  . IR RADIOLOGIST EVAL & MGMT  10/11/2017  . PELVIC LAPAROSCOPY     x9  . RADIOLOGY WITH ANESTHESIA N/A 09/05/2014   Procedure: MRI OF BRAIN WITHOUT CONTRAST;  Surgeon: Medication Radiologist, MD;  Location: [facility];  Service: Radiology;  Laterality: N/A;  MRI/DR. JAFFE  . TUBAL LIGATION    . UNILATERAL SALPINGECTOMY Right 1995   ectopic pregnancy    Allergies: Clonidine derivatives; Codeine phosphate; Moxifloxacin; Sulfamethoxazole; and Sumatriptan succinate  Medications: Prior to Admission medications   Medication Sig Start  Date End Date Taking? Authorizing Provider  atorvastatin (LIPITOR) 10 MG tablet Take 1 tablet (10 mg total) by mouth daily. 07/29/17   Panosh, Standley Brooking, MD  baclofen (LIORESAL) 10 MG tablet take ONE-HALF to 1 TAB AS NEEDED to TWICE DAILY, headache. Limit to 2 headache days per week. Avoid daily use. 01/06/16   [provider]  fluticasone (FLONASE) 50 MCG/ACT nasal spray Place 1 spray into both nostrils daily. 07/22/17   Billie Ruddy, MD  Galcanezumab-gnlm [facility]) Inject into the skin.    [provider]  megestrol (MEGACE) 20 MG tablet Take 1 tablet (20 mg total) by mouth 3 (three) times daily. 09/30/17   Nunzio Cobbs, MD  metroNIDAZOLE (FLAGYL) 500 MG tablet Take 1 tablet (500 mg total) by mouth 2 (two) times daily. 11/01/17   Nunzio Cobbs, MD  Multiple Vitamin (MULTIVITAMIN WITH MINERALS) TABS tablet Take 1 tablet by mouth daily.    [provider]  OnabotulinumtoxinA (BOTOX IJ) Inject as directed every 3 (three) months.    [provider]  pantoprazole (PROTONIX) 40 MG tablet TAKE 1 TABLET BY MOUTH DAILY 11/02/17   Panosh, Standley Brooking, MD  promethazine (PHENERGAN) 25 MG tablet Take 1 tablet (25 mg total) by mouth every 6 (six) hours as needed for nausea or vomiting. 07/08/15   Pieter Partridge, DO  triamterene-hydrochlorothiazide [facility])  37.5-25 MG tablet TAKE 1 TABLET BY MOUTH DAILY 10/04/17   Panosh, Standley Brooking, MD  venlafaxine XR (EFFEXOR-XR) 150 MG 24 hr capsule TAKE 1 CAPSULE(150 MG) BY MOUTH DAILY WITH BREAKFAST 10/04/17   Panosh, Standley Brooking, MD  zonisamide (ZONEGRAN) 100 MG capsule Take 400 mg by mouth at bedtime.  04/08/16   [provider]     Family History  Problem Relation Age of Onset  . Anxiety disorder Mother        mother is on xanax and apparently applying for  social security diabilty for her anxiety. t  . Depression Mother   . Diabetes Mother   . Hypertension Mother   . Heart attack Father   . Seizures Father    . Stroke Maternal Grandmother   . Diabetes Paternal Grandmother   . Hypertension Paternal Grandmother   . Coronary artery disease Unknown   . Diabetes Unknown   . Hyperlipidemia Unknown   . Other Unknown        history of child death in family   . Colon cancer Neg Hx   . Stomach cancer Neg Hx   . Esophageal cancer Neg Hx     Social History   Socioeconomic History  . Marital status: Single    Spouse name: Not on file  . Number of children: Not on file  . Years of education: Not on file  . Highest education level: Not on file  Occupational History  . Not on file  Social Needs  . Financial resource strain: Not on file  . Food insecurity:    Worry: Not on file    Inability: Not on file  . Transportation needs:    Medical: Not on file    Non-medical: Not on file  Tobacco Use  . Smoking status: Never Smoker  . Smokeless tobacco: Never Used  Substance and Sexual Activity  . Alcohol use: No    Alcohol/week: 0.0 standard drinks  . Drug use: No  . Sexual activity: Yes    Partners: Male    Birth control/protection: Surgical    Comment: Tubal  Lifestyle  . Physical activity:    Days per week: Not on file    Minutes per session: Not on file  . Stress: Not on file  Relationships  . Social connections:    Talks on phone: Not on file    Gets together: Not on file    Attends religious service: Not on file    Active member of club or organization: Not on file    Attends meetings of clubs or organizations: Not on file    Relationship status: Not on file  Other Topics Concern  . 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   And graduated as SW.      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  All other systems are negative.  Review of Systems  Vital Signs: BP (!) 152/85 (BP Location: Right Arm)   Pulse 99   Temp 97.9 F (36.6 C) (Oral)   Resp 16   SpO2 100%   Physical Exam  Constitutional: She is oriented to person, place, and time. She appears well-developed. No distress.  HENT:  Head: Normocephalic.  Mouth/Throat: Oropharynx is clear and moist.  Neck: Normal range of motion. No JVD present. No tracheal deviation present.  Cardiovascular: Normal rate, regular rhythm, normal heart sounds and intact distal pulses.  Pulmonary/Chest: Effort normal and breath sounds normal. No respiratory distress.  Abdominal: Soft. She exhibits no distension. There is no tenderness.  Neurological: She is alert and oriented to person, place, and time.  Skin: Skin is warm and dry.  Psychiatric: She has a normal mood and affect. Judgment normal.     Imaging: Mr Pelvis W Wo Contrast  Result Date: 10/20/2017 CLINICAL DATA:  Uterine fibroids. Endometriosis. Evaluation for uterine artery embolization. EXAM: MRI PELVIS WITHOUT AND WITH CONTRAST TECHNIQUE: Multiplanar multisequence MR imaging of the pelvis was performed both before and after administration of intravenous contrast. CONTRAST:  10 mL Gadavist COMPARISON:  None. FINDINGS: Urinary Tract: No bladder or urethral abnormality identified. Bowel:  Unremarkable visualized pelvic bowel loops. Vascular/Lymphatic: No pathologically enlarged lymph nodes or other significant abnormality. Reproductive: -- Uterus: Measures 11.7 x 7.1 by 7.3 cm (volume = 320 cm^3). Multiple fibroids are seen which involve the uterus diffusely. The largest fibroid  is intramural in location in the anterior corpus and measures 5.3 cm in maximum diameter. Other fibroids are smaller in size measuring up to 2.5 cm. Several submucosal fibroids are seen as well as a subserosal fibroid in the fundus which measures 2.2 cm. No intracavitary or pedunculated fibroids identified. Normal appearance of cervix and vagina. -- Intracavitary fibroids:  None. -- Pedunculated fibroids: None. -- Fibroid contrast enhancement: All fibroids show contrast enhancement, without significant degeneration/devascularization. -- Right ovary:  Appears normal.  No mass identified. -- Left ovary: Not visualized; question surgically absent. No adnexal mass identified. Other: No abnormal free fluid. Musculoskeletal:  Unremarkable. IMPRESSION: Diffuse uterine involvement with fibroids, largest measuring 5.3 cm. No intracavitary or pedunculated fibroids identified. No other significant abnormality. Electronically Signed   By: Earle Gell M.D.   On: 10/20/2017 08:45    Labs:  CBC: Recent Labs    06/07/17 1150  WBC 8.4  HGB 13.3  HCT 39.9  PLT 474.0*    COAGS: No results for input(s): INR, APTT in the last 8760 hours.  BMP: Recent Labs    06/07/17 1150 10/19/17 1907  NA 145  --   K 3.6  --   CL 104  --   CO2 30  --   GLUCOSE 100*  --   BUN 14  --   CALCIUM 9.5  --   CREATININE 0.97 1.00    LIVER FUNCTION TESTS: Recent Labs    05/24/17 1500  BILITOT <0.2  AST 12  ALT 10  ALKPHOS 82  PROT 7.6  ALBUMIN 4.7    TUMOR MARKERS: No results for input(s): AFPTM, CEA, CA199, CHROMGRNA in the last 8760 hours.  Assessment and Plan: Symptomatic uterine fibroids. Proceed with Uterine artery embolization procedure as discussed. Plan for overnight admission for pain/sxs control. Risks and benefits of uterine artery embolization were  discussed with the patient including, but not limited to bleeding, infection, vascular injury or contrast induced renal failure.  This interventional  procedure involves the use of X-rays and because of the nature of the planned procedure, it is possible that we will have prolonged use of X-ray fluoroscopy.  Potential radiation risks to you include (but are not limited to) the following: - A slightly elevated risk for cancer  several years later in life. This risk is typically less than 0.5% percent. This risk is low in comparison to the normal incidence of human cancer, which is 33% for women and 50% for men according to the [place]. - Radiation induced injury can include skin redness, resembling a rash, tissue breakdown / ulcers and hair loss (which can be temporary or permanent).   The likelihood of either of these occurring depends on the difficulty of the procedure and whether you are sensitive to radiation due to previous procedures, disease, or genetic conditions.   IF your procedure requires a prolonged use of radiation, you will be notified and given written instructions for further action.  It is your responsibility to monitor the irradiated area for the 2 weeks following the procedure and to notify your physician if you are concerned that you have suffered a radiation induced injury.    All of the patient's questions were answered, patient is agreeable to proceed.  Consent signed and in chart.    Thank you for this interesting consult.  I greatly enjoyed meeting [name] and look forward to participating in their care.  A copy of this report was sent to the requesting provider on this date.  Electronically Signed: Ascencion Dike, PA-C 11/11/2017, 12:04 PM   I spent a total of 20 minutes in face to face in clinical consultation, greater than 50% of which was counseling/coordinating care for UFE

## 2017-11-12 DIAGNOSIS — D250 Submucous leiomyoma of uterus: Secondary | ICD-10-CM | POA: Diagnosis not present

## 2017-11-12 MED ORDER — DOCUSATE SODIUM 100 MG PO CAPS: 100.0000 mg | ORAL_CAPSULE | Freq: Two times a day (BID) | ORAL | 0 refills | Status: DC

## 2017-11-12 NOTE — Discharge Summary (Signed)
Patient ID: [name] MRN: [id number] DOB/AGE: [birth year] 47 y.o.  Admit date: 11/11/2017 Discharge date: 11/12/2017  Supervising Physician: Corrie Mckusick  Patient Status: [facility] - In-pt  Admission Diagnoses: Symptomatic uterine fibroids  Discharge Diagnoses:  Active Problems:   Uterine fibroid   Fibroids   Discharged Condition: good  Hospital Course:   [name] is a 47 y.o. (g7, p2) female with past medical history significant for hypertension, hyperlipidemia, thyroid disease, STD (chlamydia and HSV type II), ovarian torsion (post oophorectomy and salpingectomy) and endometriosis who was seen on 10/11/2017 for evaluation of percutaneous treatment options for symptomatic uterine fibroids.    She has a long history of menorrhagia which has been treated with previous IUD placements and currently with pharmacological menstruation suppression, however she still has experienced 3 breakthrough cycles this year.    Her cycles this year lasted approximately 11-13 days and are associated with heavy menstrual bleeding necessitating the changing of pads every 1-1/2 hours.     She has been previously diagnosed with anemia, though has never received a blood transfusion.  Intraoffice pelvic ultrasound performed 02/17/2017 reported an approximately 4.6 cm fibroid within the uterine fundus.  Given her previous history of pelvic inflammatory disease as well as ovarian torsion resulting in salpingectomy and oophorectomy, she was deemed a poor candidate for hysterectomy   She presented yesterday for uterine artery embolization.  She did well after the procedure with minimal post-embolization pain.  She is having a little more pain this morning but she states it is tolerable.  She has tolerated a diet and has voided normally. She has ambulated.  She is ready for discharge later this morning.  Consults: None  Treatments:  INDICATION: Symptomatic uterine fibroids. Patient  presents today for uterine artery embolization.  Please refer to formal consultation in the epic EMR dated 10/11/2017 for additional details  EXAM: Uterine Fibroid Embolization  MEDICATIONS: Toradol 30 mg IV; Ancef 2 gm IV. The antibiotic was administered within one hour of the procedure  ANESTHESIA/SEDATION: Moderate (conscious) sedation was employed during this procedure. A total of Versed 6 mg and Fentanyl 200 mcg was administered intravenously.  Moderate Sedation Time: 60 minutes. The patient's level of consciousness and vital signs were monitored continuously by radiology nursing throughout the procedure under my direct supervision.  CONTRAST:  85 cc Omnipaque 300  FLUOROSCOPY TIME:  15 minutes, 48 seconds (7,017 mGy)  COMPLICATIONS: None immediate.  PROCEDURE: Informed consent was obtained from the patient following explanation of the procedure, risks, benefits and alternatives. The patient understands, agrees and consents for the procedure. All questions were addressed. A time out was performed prior to the initiation of the procedure. Maximal barrier sterile technique utilized including caps, mask, sterile gowns, sterile gloves, large sterile drape, hand hygiene, and Betadine prep.  The right femoral head was marked fluoroscopically. Under sterile conditions and local anesthesia, the right common femoral artery access was performed with a micropuncture needle. Under direct ultrasound guidance, the right common femoral was accessed with a micropuncture kit. An ultrasound image was saved for documentation purposes. This allowed for placement of a 5-French vascular sheath. A limited arteriogram was performed through the side arm of the sheath confirming appropriate access within the right common femoral artery.  The 5-French C2 catheter was utilized to select the contralateral left internal iliac artery. Selective left internal iliac angiogram was  performed. The tortuous left uterine artery was identified. Selective catheterization was performed of the left uterine artery with a microcatheter and  micro guide wire. A selective left uterine angiogram was performed. This demonstrated patency of the left uterine artery. Mild diffuse hypervascularity of the enlarged fibroid uterus. Access was adequate for embolization. For embolization, 1 vial of 500 - 700 micron Embospheres were injected into the left uterine artery. Post embolization angiogram confirms complete stasis of the left uterine vascular territory. Microcatheter was removed.  The C2 catheter was retracted and utilized to select the right internal iliac artery. Selective right internal iliac angiogram was performed. The patent right uterine artery was identified. For selective catheterization, the micro catheter and guidewire were utilized to select the right uterine artery. Selective right uterine angiogram was performed. This demonstrated patency of the right uterine artery. Catheter position was safe for embolization. Embolization was performed to complete stasis with injection of 1 vial of 500-700 micron Embospheres. Post embolization angiogram confirms complete stasis of the right uterine vascular territory.  At this point, all wires, catheters and sheaths were removed from the patient. Hemostasis was achieved at the right groin access site with  The patient tolerated the procedure well without immediate post procedural complication.  FINDINGS: Bilateral internal iliac arteriograms demonstrates conventional branching pattern with widely patent origins of the bilateral uterine arteries.  Sub selective bilateral uterine arteriograms demonstrate co-dominant supply to a hypertrophied myomatous uterus.  Completion arteriograms following bilateral uterine artery particle embolization demonstrates a technically excellent result with stasis of flow within the  bilateral uterine vascular territories.  IMPRESSION: Successful bilateral uterine artery embolization (U F E).  PLAN: The patient will be seen in follow-up consultation at the interventional radiology clinic in approximately 3-4 weeks.   Electronically Signed   By: Sandi Mariscal M.D.   On: 11/11/2017 16:42   Discharge Exam: Blood pressure 122/80, pulse 75, temperature 98.5 F (36.9 C), temperature source Oral, resp. rate 15, height 5' 5.5" (1.664 m), weight 101.2 kg, SpO2 97 %. Awake and alert NAD Abdomen soft, NT Right groin stick site looks good, no bleeding, no hematoma, no pseudoaneurysm No respiratory issues Heart Normal  Disposition: Discharge disposition: 01-Home or Self Care       Discharge Instructions    Call MD for:  extreme fatigue   Complete by:  As directed    Call MD for:  hives   Complete by:  As directed    Call MD for:  persistant nausea and vomiting   Complete by:  As directed    Call MD for:  redness, tenderness, or signs of infection (pain, swelling, redness, odor or green/yellow discharge around incision site)   Complete by:  As directed    Call MD for:  severe uncontrolled pain   Complete by:  As directed    Call MD for:  temperature >100.4   Complete by:  As directed    Diet - low sodium heart healthy   Complete by:  As directed    Increase activity slowly   Complete by:  As directed    Other Restrictions   Complete by:  As directed    No strenuous activity x 1 week     Our office will call patient with appointment date and time.    Electronically Signed: Murrell Redden, PA-C 11/12/2017, 8:34 AM   I have spent Less Than 30 Minutes discharging [name].

## 2017-11-12 NOTE — Plan of Care (Signed)
Reviewed discharge instructions with patient. IV removed. Patient ready for discharge.  

## 2017-11-22 ENCOUNTER — Encounter: Payer: Self-pay | Admitting: Obstetrics and Gynecology

## 2017-11-22 ENCOUNTER — Telehealth: Payer: Self-pay | Admitting: Obstetrics and Gynecology

## 2017-11-22 NOTE — Telephone Encounter (Signed)
Spoke with patient. Patient states she had to stop clonidine due to allergy. Stopped Effexor just prior to UFE, was unable to pick up medication from pharmacy. States hot flashes are "horrible", saturating clothing.   Patient states she is also waiting response from Dr. Regis Bill, does not wish to restart Effexor.   Offered patient OV with Dr. Quincy Simmonds to further discuss, patient declined at this time. Patient will wait to hear back from Dr. Regis Bill, will return call to office and schedule OV if needed. Advised patient I will update Dr. Quincy Simmonds and return call if any additional recommendations.   Routing to provider for final review. Patient is agreeable to disposition. Will close encounter.

## 2017-11-22 NOTE — Telephone Encounter (Signed)
Patient sent the following correspondence through [place]. Routing to triage to assist patient with request.  Hi Dr.Silva,  Just checking in post UFE. I am doing okay. Light spotting . Cramping goes from mild to bad depending on my level of activity. The hot flashes are horrible. Most nights I have to wake up and change clothes. I'm scheduled to see Dr. Pascal Lux on November 5th for a two week follow up.  Thanks  [employer]

## 2017-11-22 NOTE — Telephone Encounter (Signed)
Please advise Dr Panosh, thanks.   

## 2017-11-24 ENCOUNTER — Telehealth: Payer: Self-pay | Admitting: Obstetrics and Gynecology

## 2017-11-24 ENCOUNTER — Encounter: Payer: Self-pay | Admitting: Obstetrics and Gynecology

## 2017-11-24 NOTE — Telephone Encounter (Signed)
Patient sent the following message through [place]. Routing to triage to assist patient with request.   Dr. Quincy Simmonds I just heard back from Dr. Regis Bill below is her reply:  (Will the flashes get better with time? I really don't want to take any more medications. Are there natural alternatives?)    Sorry you had to go through effexor "withdrawal " but Ok to stay off Of this if headaches are ok.   If you think that Youre eating is healthier And have been off the med Lipitor for a while We can Just check the fasting lipid panel when convenient for you.      But please refill Lipitor X 6 months and get a Follow up I lab n the future ( there are future orders.)     fyi some of the serotonin meds help flushes But they are usually meds like lexapro and citalopram Etc Can ask you gyne .

## 2017-11-24 NOTE — Telephone Encounter (Signed)
Left message to call Bennet Kujawa, RN at [facility] [phone].   

## 2017-11-24 NOTE — Telephone Encounter (Signed)
Patient returned call

## 2017-11-24 NOTE — Telephone Encounter (Signed)
Patient is requesting a call back today please.

## 2017-11-24 NOTE — Telephone Encounter (Signed)
Call patient LM on machine to check mychart messages Message sent via mychart with recommendations.   Will work further on this based on what the patient decides or results.  Nothing further needed.

## 2017-11-24 NOTE — Telephone Encounter (Signed)
Sorry you had to go through effexor"withdrawal " but   Ok to stay off   Of this ifd headaches are ok.   If you think that   You eating is healthier    And have been off the med Lipitor for a while  We can  Just check the fasting lipid panel when convenient for you.    But please refill Lipitor   X 6 months and get a  Follow up I lab n the future ( there are future orders.)     fyi some of the serotonin meds help flushes   But they are usually meds like  lexapro and citalopram  Etc  Can ask you gyne .

## 2017-11-25 NOTE — Telephone Encounter (Signed)
Spoke with patient. Patient declines OV at this time to discuss treatment options for hot flashes. Patient does not want to add any new medications to her regimen, does not desire to try any OTC options, such as estroven. Patient states she has a hx of migraines and has not had one in 3 wks since stopping Effexor. Advised patient to return call to office if any additional  questions/conerns, or to schedule OV to discuss possible treatment options. Patient thankful for return call.  Routing to provider for final review. Patient is agreeable to disposition. Will close encounter.

## 2017-11-28 ENCOUNTER — Ambulatory Visit: Payer: BLUE CROSS/BLUE SHIELD

## 2017-11-28 ENCOUNTER — Other Ambulatory Visit: Payer: Self-pay | Admitting: Internal Medicine

## 2017-11-29 ENCOUNTER — Encounter: Payer: Self-pay | Admitting: *Deleted

## 2017-11-29 ENCOUNTER — Ambulatory Visit
Admission: RE | Admit: 2017-11-29 | Discharge: 2017-11-29 | Disposition: A | Discharge status: Home / Self Care | Payer: BLUE CROSS/BLUE SHIELD | Source: Ambulatory Visit | Attending: Radiology | Admitting: Radiology

## 2017-11-29 DIAGNOSIS — D219 Benign neoplasm of connective and other soft tissue, unspecified: Secondary | ICD-10-CM

## 2017-11-29 HISTORY — PX: IR RADIOLOGIST EVAL & MGMT: IMG5224

## 2017-11-29 NOTE — Progress Notes (Signed)
Patient ID: [name], female   DOB: 1971/01/23, 47 y.o.   MRN: [id number]         Chief Complaint: Post uterine fibroid embolization   Referring Physician(s): Josefa Half  History of Present Illness:  [name] is a 47 y.o. (g7, p2) female with past medical history significant for hypertension, hyperlipidemia, thyroid disease, STD (chlamydia and HSV type II), ovarian torsion (post oophorectomy and salpingectomy) and endometriosis who returns today to the interventional radiology clinic following successful bilateral uterine artery embolization performed 11/11/2017. The patient is unaccompanied and serves as her own historian.  In brief review, the patient has a long history of menorrhagia which has been treated with previous IUD placements and currently with pharmacological menstruation suppression, however she still has experienced 3 breakthrough cycles this year.  Her cycles this year lasted approximately 11-13 days and are associated with heavy menstrual bleeding necessitating the changing of pads every 1-1/2 hours.   She has been previously diagnosed with anemia, though has never received a blood transfusion. Patient admits to crampy abdominal and pelvic pain at the time of her cycle.    Patient continues to report of a minimal amount of fatigue though otherwise states she has fully recovered from the procedure.  She denies crampy abdominal or pelvic pain.    Patient has not had a cycle since undergoing the procedure.  The patient does state that she has experienced the worsening of hot flashes since undergoing the procedure.  Also of note, patient does state that she has noticed an improvement in her migraines as well as her back pain since undergoing the embolization.     Past Medical History:  Diagnosis Date  . Allergic rhinitis    hx of testing and allergic to spring and fall f= pollens  . Anemia    nos  . Anxiety   . Chest pain    neg stress test summer  2010  . Endometriosis   . Endometriosis   . Fibroids   . GERD (gastroesophageal reflux disease)   . Goiter   . Headache(784.0)   . 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 Hyperlipidemia   . Hypertension   . Irritable bowel syndrome   . Labial cyst 04/08/2011   early infection use hotpcompresses    . Migraines   . Ovarian torsion   . Sinusitis   . STD (sexually transmitted disease)    Pos. Chlamydia--in her 67's  . Thyroid disease    goiter    Past Surgical History:  Procedure Laterality Date  . CESAREAN SECTION  2002, 2004   x2  . IR ANGIOGRAM PELVIS SELECTIVE OR SUPRASELECTIVE  11/11/2017  . IR ANGIOGRAM PELVIS SELECTIVE OR SUPRASELECTIVE  11/11/2017  . IR ANGIOGRAM SELECTIVE EACH ADDITIONAL VESSEL  11/11/2017  . IR ANGIOGRAM SELECTIVE EACH ADDITIONAL VESSEL  11/11/2017  . IR EMBO TUMOR ORGAN ISCHEMIA INFARCT INC GUIDE ROADMAPPING  11/11/2017  . IR RADIOLOGIST EVAL & MGMT  10/11/2017  . IR RADIOLOGIST EVAL & MGMT  11/29/2017  . IR US GUIDE VASC ACCESS RIGHT  11/11/2017  . PELVIC LAPAROSCOPY     x9  . RADIOLOGY WITH ANESTHESIA N/A 09/05/2014   Procedure: MRI OF BRAIN WITHOUT CONTRAST;  Surgeon: Medication Radiologist, MD;  Location: [facility];  Service: Radiology;  Laterality: N/A;  MRI/DR. JAFFE  . TUBAL LIGATION    . UNILATERAL SALPINGECTOMY Right 1995   ectopic pregnancy  Allergies: Clonidine derivatives; Codeine phosphate; Moxifloxacin; Sulfamethoxazole; and Sumatriptan succinate  Medications: Prior to Admission medications   Medication Sig Start Date End Date Taking? Authorizing Provider  atorvastatin (LIPITOR) 10 MG tablet TAKE 1 TABLET(10 MG) BY MOUTH DAILY 11/29/17  Yes Panosh, Standley Brooking, MD  baclofen (LIORESAL) 10 MG tablet Take 10 mg by mouth 3 (three) times daily as needed for muscle spasms.  01/06/16  Yes [provider]  docusate sodium (COLACE) 100 MG capsule Take 1 capsule (100 mg total) by mouth 2 (two)  times daily. 11/12/17  Yes Ardis Rowan, PA-C  fluticasone [facility]) 50 MCG/ACT nasal spray Place 1 spray into both nostrils daily. 07/22/17  Yes Billie Ruddy, MD  Galcanezumab-gnlm (EMGALITY [place]) Inject 120 mg into the skin every 30 (thirty) days.    Yes [provider]  Multiple Vitamin (MULTIVITAMIN WITH MINERALS) TABS tablet Take 1 tablet by mouth daily.   Yes [provider]  OnabotulinumtoxinA (BOTOX IJ) Inject as directed every 3 (three) months.   Yes [provider]  pantoprazole (PROTONIX) 40 MG tablet TAKE 1 TABLET BY MOUTH DAILY 11/02/17  Yes Panosh, Standley Brooking, MD  promethazine (PHENERGAN) 25 MG tablet Take 1 tablet (25 mg total) by mouth every 6 (six) hours as needed for nausea or vomiting. 07/08/15  Yes Tomi Likens, Adam R, DO  triamterene-hydrochlorothiazide (MAXZIDE-25) 37.5-25 MG tablet TAKE 1 TABLET BY MOUTH DAILY 10/04/17  Yes Panosh, Standley Brooking, MD  zonisamide (ZONEGRAN) 100 MG capsule Take 100-300 mg by mouth 2 (two) times daily.  04/08/16  Yes [provider]  venlafaxine XR (EFFEXOR-XR) 150 MG 24 hr capsule TAKE 1 CAPSULE(150 MG) BY MOUTH DAILY WITH BREAKFAST Patient taking differently: Take 150 mg by mouth daily with breakfast.  10/04/17   Panosh, Standley Brooking, MD     Family History  Problem Relation Age of Onset  . Anxiety disorder Mother        mother is on xanax and apparently applying for  social security diabilty for her anxiety. t  . Depression Mother   . Diabetes Mother   . Hypertension Mother   . Heart attack Father   . Seizures Father   . Stroke Maternal Grandmother   . Diabetes Paternal Grandmother   . Hypertension Paternal Grandmother   . Coronary artery disease Unknown   . Diabetes Unknown   . Hyperlipidemia Unknown   . Other Unknown        history of child death in family   . Colon cancer Neg Hx   . Stomach cancer Neg Hx   . Esophageal cancer Neg Hx     Social History   Socioeconomic History  . Marital status: Single      Spouse name: Not on file  . Number of children: Not on file  . Years of education: Not on file  . Highest education level: Not on file  Occupational History  . Not on file  Social Needs  . Financial resource strain: Not on file  . Food insecurity:    Worry: Not on file    Inability: Not on file  . Transportation needs:    Medical: Not on file    Non-medical: Not on file  Tobacco Use  . Smoking status: Never Smoker  . Smokeless tobacco: Never Used  Substance and Sexual Activity  . Alcohol use: No    Alcohol/week: 0.0 standard drinks  . Drug use: No  . Sexual activity: Yes    Partners: Male  Birth control/protection: Surgical    Comment: Tubal  Lifestyle  . Physical activity:    Days per week: Not on file    Minutes per session: Not on file  . Stress: Not on file  Relationships  . Social connections:    Talks on phone: Not on file    Gets together: Not on file    Attends religious service: Not on file    Active member of club or organization: Not on file    Attends meetings of clubs or organizations: Not on file    Relationship status: Not on file  Other Topics Concern  . 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   And graduated as SW.      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  All other systems are negative.  Review of Systems  Constitutional:  Positive for activity change and fatigue. Negative for appetite change.  Respiratory: Negative.   Cardiovascular: Negative.   Gastrointestinal: Negative.   Neurological: Negative for headaches.    Vital Signs: BP (!) 142/89   Pulse 87   Temp 98.1 F (36.7 C) (Oral)   Resp 15   Ht 5' 5" (1.651 m)   Wt 100.7 kg   SpO2 99%   BMI 36.94 kg/m   Physical Exam  Mallampati Score:     Imaging: Ir Angiogram Pelvis Selective Or Supraselective  Result Date: 11/11/2017 INDICATION: Symptomatic uterine fibroids. Patient presents today for uterine artery embolization. Please refer to formal consultation in the epic EMR dated 10/11/2017 for additional details EXAM: Uterine Fibroid Embolization MEDICATIONS: Toradol 30 mg IV; Ancef 2 gm IV. The antibiotic was administered within one hour of the procedure ANESTHESIA/SEDATION: Moderate (conscious) sedation was employed during this procedure. A total of Versed 6 mg and Fentanyl 200 mcg was administered intravenously. Moderate Sedation Time: 60 minutes. The patient's level of consciousness and vital signs were monitored continuously by radiology nursing throughout the procedure under my direct supervision. CONTRAST:  85 cc Omnipaque 300 FLUOROSCOPY TIME:  15 minutes, 48 seconds (8,416 mGy) COMPLICATIONS: None immediate. PROCEDURE: Informed consent was obtained from the patient following explanation of the procedure, risks, benefits and alternatives. The patient understands, agrees and consents for the procedure. All questions were addressed. A time out was performed prior to the initiation of the procedure. Maximal barrier sterile technique utilized including caps, mask, sterile gowns, sterile gloves, large sterile drape, hand hygiene, and Betadine prep. The right femoral head was marked fluoroscopically. Under sterile conditions and local anesthesia, the right common femoral artery access was performed with a micropuncture needle. Under direct ultrasound  guidance, the right common femoral was accessed with a micropuncture kit. An ultrasound image was  saved for documentation purposes. This allowed for placement of a 5-French vascular sheath. A limited arteriogram was performed through the side arm of the sheath confirming appropriate access within the right common femoral artery. The 5-French C2 catheter was utilized to select the contralateral left internal iliac artery. Selective left internal iliac angiogram was performed. The tortuous left uterine artery was identified. Selective catheterization was performed of the left uterine artery with a microcatheter and micro guide wire. A selective left uterine angiogram was performed. This demonstrated patency of the left uterine artery. Mild diffuse hypervascularity of the enlarged fibroid uterus. Access was adequate for embolization. For embolization, 1 vial of 500 - 700 micron Embospheres were injected into the left uterine artery. Post embolization angiogram confirms complete stasis of the left uterine vascular territory. Microcatheter was removed. The C2 catheter was retracted and utilized to select the right internal iliac artery. Selective right internal iliac angiogram was performed. The patent right uterine artery was identified. For selective catheterization, the micro catheter and guidewire were utilized to select the right uterine artery. Selective right uterine angiogram was performed. This demonstrated patency of the right uterine artery. Catheter position was safe for embolization. Embolization was performed to complete stasis with injection of 1 vial of 500-700 micron Embospheres. Post embolization angiogram confirms complete stasis of the right uterine vascular territory. At this point, all wires, catheters and sheaths were removed from the patient. Hemostasis was achieved at the right groin access site with The patient tolerated the procedure well without immediate post procedural complication.  FINDINGS: Bilateral internal iliac arteriograms demonstrates conventional branching pattern with widely patent origins of the bilateral uterine arteries. Sub selective bilateral uterine arteriograms demonstrate co-dominant supply to a hypertrophied myomatous uterus. Completion arteriograms following bilateral uterine artery particle embolization demonstrates a technically excellent result with stasis of flow within the bilateral uterine vascular territories. IMPRESSION: Successful bilateral uterine artery embolization (U F E). PLAN: The patient will be seen in follow-up consultation at the interventional radiology clinic in approximately 3-4 weeks. Electronically Signed   By: Sandi Mariscal M.D.   On: 11/11/2017 16:42   Ir Angiogram Pelvis Selective Or Supraselective  Result Date: 11/11/2017 INDICATION: Symptomatic uterine fibroids. Patient presents today for uterine artery embolization. Please refer to formal consultation in the epic EMR dated 10/11/2017 for additional details EXAM: Uterine Fibroid Embolization MEDICATIONS: Toradol 30 mg IV; Ancef 2 gm IV. The antibiotic was administered within one hour of the procedure ANESTHESIA/SEDATION: Moderate (conscious) sedation was employed during this procedure. A total of Versed 6 mg and Fentanyl 200 mcg was administered intravenously. Moderate Sedation Time: 60 minutes. The patient's level of consciousness and vital signs were monitored continuously by radiology nursing throughout the procedure under my direct supervision. CONTRAST:  85 cc Omnipaque 300 FLUOROSCOPY TIME:  15 minutes, 48 seconds (8,527 mGy) COMPLICATIONS: None immediate. PROCEDURE: Informed consent was obtained from the patient following explanation of the procedure, risks, benefits and alternatives. The patient understands, agrees and consents for the procedure. All questions were addressed. A time out was performed prior to the initiation of the procedure. Maximal barrier sterile technique utilized  including caps, mask, sterile gowns, sterile gloves, large sterile drape, hand hygiene, and Betadine prep. The right femoral head was marked fluoroscopically. Under sterile conditions and local anesthesia, the right common femoral artery access was performed with a micropuncture needle. Under direct ultrasound guidance, the right common femoral was accessed with a micropuncture kit. An ultrasound image was saved for documentation purposes. This allowed for placement of  a 5-French vascular sheath. A limited arteriogram was performed through the side arm of the sheath confirming appropriate access within the right common femoral artery. The 5-French C2 catheter was utilized to select the contralateral left internal iliac artery. Selective left internal iliac angiogram was performed. The tortuous left uterine artery was identified. Selective catheterization was performed of the left uterine artery with a microcatheter and micro guide wire. A selective left uterine angiogram was performed. This demonstrated patency of the left uterine artery. Mild diffuse hypervascularity of the enlarged fibroid uterus. Access was adequate for embolization. For embolization, 1 vial of 500 - 700 micron Embospheres were injected into the left uterine artery. Post embolization angiogram confirms complete stasis of the left uterine vascular territory. Microcatheter was removed. The C2 catheter was retracted and utilized to select the right internal iliac artery. Selective right internal iliac angiogram was performed. The patent right uterine artery was identified. For selective catheterization, the micro catheter and guidewire were utilized to select the right uterine artery. Selective right uterine angiogram was performed. This demonstrated patency of the right uterine artery. Catheter position was safe for embolization. Embolization was performed to complete stasis with injection of 1 vial of 500-700 micron Embospheres. Post embolization  angiogram confirms complete stasis of the right uterine vascular territory. At this point, all wires, catheters and sheaths were removed from the patient. Hemostasis was achieved at the right groin access site with The patient tolerated the procedure well without immediate post procedural complication. FINDINGS: Bilateral internal iliac arteriograms demonstrates conventional branching pattern with widely patent origins of the bilateral uterine arteries. Sub selective bilateral uterine arteriograms demonstrate co-dominant supply to a hypertrophied myomatous uterus. Completion arteriograms following bilateral uterine artery particle embolization demonstrates a technically excellent result with stasis of flow within the bilateral uterine vascular territories. IMPRESSION: Successful bilateral uterine artery embolization (U F E). PLAN: The patient will be seen in follow-up consultation at the interventional radiology clinic in approximately 3-4 weeks. Electronically Signed   By: Sandi Mariscal M.D.   On: 11/11/2017 16:42   Ir Angiogram Selective Each Additional Vessel  Result Date: 11/11/2017 INDICATION: Symptomatic uterine fibroids. Patient presents today for uterine artery embolization. Please refer to formal consultation in the epic EMR dated 10/11/2017 for additional details EXAM: Uterine Fibroid Embolization MEDICATIONS: Toradol 30 mg IV; Ancef 2 gm IV. The antibiotic was administered within one hour of the procedure ANESTHESIA/SEDATION: Moderate (conscious) sedation was employed during this procedure. A total of Versed 6 mg and Fentanyl 200 mcg was administered intravenously. Moderate Sedation Time: 60 minutes. The patient's level of consciousness and vital signs were monitored continuously by radiology nursing throughout the procedure under my direct supervision. CONTRAST:  85 cc Omnipaque 300 FLUOROSCOPY TIME:  15 minutes, 48 seconds (9,163 mGy) COMPLICATIONS: None immediate. PROCEDURE: Informed consent was  obtained from the patient following explanation of the procedure, risks, benefits and alternatives. The patient understands, agrees and consents for the procedure. All questions were addressed. A time out was performed prior to the initiation of the procedure. Maximal barrier sterile technique utilized including caps, mask, sterile gowns, sterile gloves, large sterile drape, hand hygiene, and Betadine prep. The right femoral head was marked fluoroscopically. Under sterile conditions and local anesthesia, the right common femoral artery access was performed with a micropuncture needle. Under direct ultrasound guidance, the right common femoral was accessed with a micropuncture kit. An ultrasound image was saved for documentation purposes. This allowed for placement of a 5-French vascular sheath. A limited arteriogram was performed  through the side arm of the sheath confirming appropriate access within the right common femoral artery. The 5-French C2 catheter was utilized to select the contralateral left internal iliac artery. Selective left internal iliac angiogram was performed. The tortuous left uterine artery was identified. Selective catheterization was performed of the left uterine artery with a microcatheter and micro guide wire. A selective left uterine angiogram was performed. This demonstrated patency of the left uterine artery. Mild diffuse hypervascularity of the enlarged fibroid uterus. Access was adequate for embolization. For embolization, 1 vial of 500 - 700 micron Embospheres were injected into the left uterine artery. Post embolization angiogram confirms complete stasis of the left uterine vascular territory. Microcatheter was removed. The C2 catheter was retracted and utilized to select the right internal iliac artery. Selective right internal iliac angiogram was performed. The patent right uterine artery was identified. For selective catheterization, the micro catheter and guidewire were utilized  to select the right uterine artery. Selective right uterine angiogram was performed. This demonstrated patency of the right uterine artery. Catheter position was safe for embolization. Embolization was performed to complete stasis with injection of 1 vial of 500-700 micron Embospheres. Post embolization angiogram confirms complete stasis of the right uterine vascular territory. At this point, all wires, catheters and sheaths were removed from the patient. Hemostasis was achieved at the right groin access site with The patient tolerated the procedure well without immediate post procedural complication. FINDINGS: Bilateral internal iliac arteriograms demonstrates conventional branching pattern with widely patent origins of the bilateral uterine arteries. Sub selective bilateral uterine arteriograms demonstrate co-dominant supply to a hypertrophied myomatous uterus. Completion arteriograms following bilateral uterine artery particle embolization demonstrates a technically excellent result with stasis of flow within the bilateral uterine vascular territories. IMPRESSION: Successful bilateral uterine artery embolization (U F E). PLAN: The patient will be seen in follow-up consultation at the interventional radiology clinic in approximately 3-4 weeks. Electronically Signed   By: Sandi Mariscal M.D.   On: 11/11/2017 16:42   Ir Angiogram Selective Each Additional Vessel  Result Date: 11/11/2017 INDICATION: Symptomatic uterine fibroids. Patient presents today for uterine artery embolization. Please refer to formal consultation in the epic EMR dated 10/11/2017 for additional details EXAM: Uterine Fibroid Embolization MEDICATIONS: Toradol 30 mg IV; Ancef 2 gm IV. The antibiotic was administered within one hour of the procedure ANESTHESIA/SEDATION: Moderate (conscious) sedation was employed during this procedure. A total of Versed 6 mg and Fentanyl 200 mcg was administered intravenously. Moderate Sedation Time: 60 minutes.  The patient's level of consciousness and vital signs were monitored continuously by radiology nursing throughout the procedure under my direct supervision. CONTRAST:  85 cc Omnipaque 300 FLUOROSCOPY TIME:  15 minutes, 48 seconds (5,400 mGy) COMPLICATIONS: None immediate. PROCEDURE: Informed consent was obtained from the patient following explanation of the procedure, risks, benefits and alternatives. The patient understands, agrees and consents for the procedure. All questions were addressed. A time out was performed prior to the initiation of the procedure. Maximal barrier sterile technique utilized including caps, mask, sterile gowns, sterile gloves, large sterile drape, hand hygiene, and Betadine prep. The right femoral head was marked fluoroscopically. Under sterile conditions and local anesthesia, the right common femoral artery access was performed with a micropuncture needle. Under direct ultrasound guidance, the right common femoral was accessed with a micropuncture kit. An ultrasound image was saved for documentation purposes. This allowed for placement of a 5-French vascular sheath. A limited arteriogram was performed through the side arm of the sheath confirming appropriate  access within the right common femoral artery. The 5-French C2 catheter was utilized to select the contralateral left internal iliac artery. Selective left internal iliac angiogram was performed. The tortuous left uterine artery was identified. Selective catheterization was performed of the left uterine artery with a microcatheter and micro guide wire. A selective left uterine angiogram was performed. This demonstrated patency of the left uterine artery. Mild diffuse hypervascularity of the enlarged fibroid uterus. Access was adequate for embolization. For embolization, 1 vial of 500 - 700 micron Embospheres were injected into the left uterine artery. Post embolization angiogram confirms complete stasis of the left uterine vascular  territory. Microcatheter was removed. The C2 catheter was retracted and utilized to select the right internal iliac artery. Selective right internal iliac angiogram was performed. The patent right uterine artery was identified. For selective catheterization, the micro catheter and guidewire were utilized to select the right uterine artery. Selective right uterine angiogram was performed. This demonstrated patency of the right uterine artery. Catheter position was safe for embolization. Embolization was performed to complete stasis with injection of 1 vial of 500-700 micron Embospheres. Post embolization angiogram confirms complete stasis of the right uterine vascular territory. At this point, all wires, catheters and sheaths were removed from the patient. Hemostasis was achieved at the right groin access site with The patient tolerated the procedure well without immediate post procedural complication. FINDINGS: Bilateral internal iliac arteriograms demonstrates conventional branching pattern with widely patent origins of the bilateral uterine arteries. Sub selective bilateral uterine arteriograms demonstrate co-dominant supply to a hypertrophied myomatous uterus. Completion arteriograms following bilateral uterine artery particle embolization demonstrates a technically excellent result with stasis of flow within the bilateral uterine vascular territories. IMPRESSION: Successful bilateral uterine artery embolization (U F E). PLAN: The patient will be seen in follow-up consultation at the interventional radiology clinic in approximately 3-4 weeks. Electronically Signed   By: Sandi Mariscal M.D.   On: 11/11/2017 16:42   Ir US Guide Vasc Access Right  Result Date: 11/11/2017 INDICATION: Symptomatic uterine fibroids. Patient presents today for uterine artery embolization. Please refer to formal consultation in the epic EMR dated 10/11/2017 for additional details EXAM: Uterine Fibroid Embolization MEDICATIONS: Toradol  30 mg IV; Ancef 2 gm IV. The antibiotic was administered within one hour of the procedure ANESTHESIA/SEDATION: Moderate (conscious) sedation was employed during this procedure. A total of Versed 6 mg and Fentanyl 200 mcg was administered intravenously. Moderate Sedation Time: 60 minutes. The patient's level of consciousness and vital signs were monitored continuously by radiology nursing throughout the procedure under my direct supervision. CONTRAST:  85 cc Omnipaque 300 FLUOROSCOPY TIME:  15 minutes, 48 seconds (2,353 mGy) COMPLICATIONS: None immediate. PROCEDURE: Informed consent was obtained from the patient following explanation of the procedure, risks, benefits and alternatives. The patient understands, agrees and consents for the procedure. All questions were addressed. A time out was performed prior to the initiation of the procedure. Maximal barrier sterile technique utilized including caps, mask, sterile gowns, sterile gloves, large sterile drape, hand hygiene, and Betadine prep. The right femoral head was marked fluoroscopically. Under sterile conditions and local anesthesia, the right common femoral artery access was performed with a micropuncture needle. Under direct ultrasound guidance, the right common femoral was accessed with a micropuncture kit. An ultrasound image was saved for documentation purposes. This allowed for placement of a 5-French vascular sheath. A limited arteriogram was performed through the side arm of the sheath confirming appropriate access within the right common femoral artery. The 5-French  C2 catheter was utilized to select the contralateral left internal iliac artery. Selective left internal iliac angiogram was performed. The tortuous left uterine artery was identified. Selective catheterization was performed of the left uterine artery with a microcatheter and micro guide wire. A selective left uterine angiogram was performed. This demonstrated patency of the left uterine  artery. Mild diffuse hypervascularity of the enlarged fibroid uterus. Access was adequate for embolization. For embolization, 1 vial of 500 - 700 micron Embospheres were injected into the left uterine artery. Post embolization angiogram confirms complete stasis of the left uterine vascular territory. Microcatheter was removed. The C2 catheter was retracted and utilized to select the right internal iliac artery. Selective right internal iliac angiogram was performed. The patent right uterine artery was identified. For selective catheterization, the micro catheter and guidewire were utilized to select the right uterine artery. Selective right uterine angiogram was performed. This demonstrated patency of the right uterine artery. Catheter position was safe for embolization. Embolization was performed to complete stasis with injection of 1 vial of 500-700 micron Embospheres. Post embolization angiogram confirms complete stasis of the right uterine vascular territory. At this point, all wires, catheters and sheaths were removed from the patient. Hemostasis was achieved at the right groin access site with The patient tolerated the procedure well without immediate post procedural complication. FINDINGS: Bilateral internal iliac arteriograms demonstrates conventional branching pattern with widely patent origins of the bilateral uterine arteries. Sub selective bilateral uterine arteriograms demonstrate co-dominant supply to a hypertrophied myomatous uterus. Completion arteriograms following bilateral uterine artery particle embolization demonstrates a technically excellent result with stasis of flow within the bilateral uterine vascular territories. IMPRESSION: Successful bilateral uterine artery embolization (U F E). PLAN: The patient will be seen in follow-up consultation at the interventional radiology clinic in approximately 3-4 weeks. Electronically Signed   By: Sandi Mariscal M.D.   On: 11/11/2017 16:42   Ir Embo Tumor  Organ Ischemia Infarct Inc Guide Roadmapping  Result Date: 11/11/2017 INDICATION: Symptomatic uterine fibroids. Patient presents today for uterine artery embolization. Please refer to formal consultation in the epic EMR dated 10/11/2017 for additional details EXAM: Uterine Fibroid Embolization MEDICATIONS: Toradol 30 mg IV; Ancef 2 gm IV. The antibiotic was administered within one hour of the procedure ANESTHESIA/SEDATION: Moderate (conscious) sedation was employed during this procedure. A total of Versed 6 mg and Fentanyl 200 mcg was administered intravenously. Moderate Sedation Time: 60 minutes. The patient's level of consciousness and vital signs were monitored continuously by radiology nursing throughout the procedure under my direct supervision. CONTRAST:  85 cc Omnipaque 300 FLUOROSCOPY TIME:  15 minutes, 48 seconds (4,709 mGy) COMPLICATIONS: None immediate. PROCEDURE: Informed consent was obtained from the patient following explanation of the procedure, risks, benefits and alternatives. The patient understands, agrees and consents for the procedure. All questions were addressed. A time out was performed prior to the initiation of the procedure. Maximal barrier sterile technique utilized including caps, mask, sterile gowns, sterile gloves, large sterile drape, hand hygiene, and Betadine prep. The right femoral head was marked fluoroscopically. Under sterile conditions and local anesthesia, the right common femoral artery access was performed with a micropuncture needle. Under direct ultrasound guidance, the right common femoral was accessed with a micropuncture kit. An ultrasound image was saved for documentation purposes. This allowed for placement of a 5-French vascular sheath. A limited arteriogram was performed through the side arm of the sheath confirming appropriate access within the right common femoral artery. The 5-French C2 catheter was utilized to select  the contralateral left internal iliac  artery. Selective left internal iliac angiogram was performed. The tortuous left uterine artery was identified. Selective catheterization was performed of the left uterine artery with a microcatheter and micro guide wire. A selective left uterine angiogram was performed. This demonstrated patency of the left uterine artery. Mild diffuse hypervascularity of the enlarged fibroid uterus. Access was adequate for embolization. For embolization, 1 vial of 500 - 700 micron Embospheres were injected into the left uterine artery. Post embolization angiogram confirms complete stasis of the left uterine vascular territory. Microcatheter was removed. The C2 catheter was retracted and utilized to select the right internal iliac artery. Selective right internal iliac angiogram was performed. The patent right uterine artery was identified. For selective catheterization, the micro catheter and guidewire were utilized to select the right uterine artery. Selective right uterine angiogram was performed. This demonstrated patency of the right uterine artery. Catheter position was safe for embolization. Embolization was performed to complete stasis with injection of 1 vial of 500-700 micron Embospheres. Post embolization angiogram confirms complete stasis of the right uterine vascular territory. At this point, all wires, catheters and sheaths were removed from the patient. Hemostasis was achieved at the right groin access site with The patient tolerated the procedure well without immediate post procedural complication. FINDINGS: Bilateral internal iliac arteriograms demonstrates conventional branching pattern with widely patent origins of the bilateral uterine arteries. Sub selective bilateral uterine arteriograms demonstrate co-dominant supply to a hypertrophied myomatous uterus. Completion arteriograms following bilateral uterine artery particle embolization demonstrates a technically excellent result with stasis of flow within the  bilateral uterine vascular territories. IMPRESSION: Successful bilateral uterine artery embolization (U F E). PLAN: The patient will be seen in follow-up consultation at the interventional radiology clinic in approximately 3-4 weeks. Electronically Signed   By: Sandi Mariscal M.D.   On: 11/11/2017 16:42   Ir Radiologist Eval & Mgmt  Result Date: 11/29/2017 Please refer to notes tab for details about interventional procedure. (Op Note)   Labs:  CBC: Recent Labs    06/07/17 1150 11/11/17 1228  WBC 8.4 8.4  HGB 13.3 12.4  HCT 39.9 38.2  PLT 474.0* 471*    COAGS: Recent Labs    11/11/17 1228  INR 1.01  APTT 27    BMP: Recent Labs    06/07/17 1150 10/19/17 1907 11/11/17 1228  NA 145  --  143  K 3.6  --  3.1*  CL 104  --  114*  CO2 30  --  20*  GLUCOSE 100*  --  111*  BUN 14  --  11  CALCIUM 9.5  --  9.0  CREATININE 0.97 1.00 0.89  GFRNONAA  --   --  >60  GFRAA  --   --  >60    LIVER FUNCTION TESTS: Recent Labs    05/24/17 1500  BILITOT <0.2  AST 12  ALT 10  ALKPHOS 82  PROT 7.6  ALBUMIN 4.7    TUMOR MARKERS: No results for input(s): AFPTM, CEA, CA199, CHROMGRNA in the last 8760 hours.  Assessment and Plan:  [name] is a 47 y.o. (g7, p2) female with past medical history significant for hypertension, hyperlipidemia, thyroid disease, STD (chlamydia and HSV type II), ovarian torsion (post oophorectomy and salpingectomy) and endometriosis who returns today to the interventional radiology clinic following successful bilateral uterine artery embolization performed 11/11/2017.   Patient continues to report of a minimal amount of fatigue though otherwise states she has fully recovered from the  procedure.  Patient does state that she has experienced the worsening of hot flashes since undergoing the procedure.  Of note, patient does state that she has noticed an improvement in her migraines as well as her back pain since undergoing the  embolization.  Selected images from preprocedural abdominal MRI performed 10/19/2017 as well as intraprocedural images during bilateral uterine artery embolization performed 11/11/2017 were reviewed in detail with the patient.  I explained that the next several months can be somewhat unpredictable for the patient with recurrence of her menorrhagia as well as potential missing of a cycle and/or spotting.    I explained I am hopeful that 3 to 6 months following the procedure if she will have noticed significant improvement in her preprocedural fibroid related complaints.  The patient will return to the interventional radiology clinic 6 months following the procedure (mid April 2020) for postprocedural consultation.  Note, postprocedural MRI will only be performed if patient patient is experiencing persistent fibroid related symptoms.  The patient knows to call the interventional radiology clinic with any interval questions or concerns  A copy of this report was sent to the requesting provider on this date.  Electronically Signed: Sandi Mariscal 11/29/2017, 12:59 PM   I spent a total of 15 Minutes in face to face in clinical consultation, greater than 50% of which was counseling/coordinating care for post UFE.

## 2017-12-16 ENCOUNTER — Other Ambulatory Visit: Payer: Self-pay | Admitting: Internal Medicine

## 2017-12-19 ENCOUNTER — Other Ambulatory Visit: Payer: Self-pay | Admitting: Internal Medicine

## 2017-12-19 ENCOUNTER — Other Ambulatory Visit: Payer: Self-pay | Admitting: *Deleted

## 2017-12-19 ENCOUNTER — Telehealth: Payer: Self-pay | Admitting: Internal Medicine

## 2017-12-19 MED ORDER — ATORVASTATIN CALCIUM 10 MG PO TABS: 10.0000 mg | ORAL_TABLET | Freq: Every day | ORAL | 0 refills | Status: DC

## 2017-12-19 MED ORDER — ATORVASTATIN CALCIUM 10 MG PO TABS: 10.0000 mg | ORAL_TABLET | Freq: Every day | ORAL | 2 refills | Status: DC

## 2017-12-19 MED ORDER — PANTOPRAZOLE SODIUM 40 MG PO TBEC: 40.0000 mg | DELAYED_RELEASE_TABLET | Freq: Every day | ORAL | 0 refills | Status: DC

## 2017-12-19 MED ORDER — TRIAMTERENE-HCTZ 37.5-25 MG PO TABS: 1.0000 | ORAL_TABLET | Freq: Every day | ORAL | 0 refills | Status: DC

## 2017-12-19 NOTE — Telephone Encounter (Signed)
Copied from [place] [id number]. Topic: Quick Communication - Rx Refill/Question >> Dec 19, 2017  9:50 AM Scherrie Gerlach wrote: Medication: triamterene-hydrochlorothiazide (ORVIFBP-79) 37.5-25 MG tablet  Pt requesting a 90 day of this med  atorvastatin (LIPITOR) 10 MG tablet  (requesting this be sent in as a 90 day instead of the 30 day w/ 2 refills) Pt states she is losing her insurance 11/30 and wanting these meds sent in as a 90 day so she can pick up by 11/30 Pt states she has called the pharmacy several times  [facility], [place] - [address] [phone] (Phone) [phone] (Fax)

## 2017-12-19 NOTE — Telephone Encounter (Signed)
All refills have been corrected and sent to patient pharmacy by office except Lipitor. (corrected and sent )

## 2018-02-03 ENCOUNTER — Other Ambulatory Visit: Payer: Self-pay | Admitting: Internal Medicine

## 2018-02-03 MED ORDER — CITALOPRAM HYDROBROMIDE 20 MG PO TABS: 10.0000 mg | ORAL_TABLET | Freq: Every day | ORAL | 0 refills | Status: DC

## 2018-02-03 NOTE — Telephone Encounter (Signed)
Sorry you are not  Feeling well.   Med list says you might have been on effexor   dont want to be on both effexor and citalopram   Can take citalopram 20 mg  Take 1/2 ( 10 mg) once a day disp 30 ( this is low dose)  And then rov in 3 weeks

## 2018-02-24 ENCOUNTER — Encounter: Payer: Self-pay | Admitting: Internal Medicine

## 2018-02-24 ENCOUNTER — Telehealth: Payer: Self-pay | Admitting: Internal Medicine

## 2018-02-24 NOTE — Telephone Encounter (Signed)
Telephone call to patient .  Patient states that issue has been resolved.

## 2018-02-24 NOTE — Telephone Encounter (Signed)
This encounter was created in error - please disregard.

## 2018-03-21 ENCOUNTER — Telehealth: Payer: Self-pay | Admitting: Internal Medicine

## 2018-03-21 NOTE — Telephone Encounter (Signed)
Certainly can increase to  Whole tablest  20 mg per day until she is able to come in to office . Please make sure she has refills until then and change the sig

## 2018-03-21 NOTE — Telephone Encounter (Signed)
Copied from [place] [id number]. Topic: Quick Communication - See Telephone Encounter >> Mar 21, 2018  3:39 PM Bea Graff, NT wrote: CRM for notification. See Telephone encounter for: 03/21/18. Pt would like to see if she can go up on her Celexa from a half tablet to a whole tablet? And she is not able to take off until April to come in for an appt. Please advise. May respond via [place].

## 2018-03-22 ENCOUNTER — Other Ambulatory Visit: Payer: Self-pay

## 2018-03-22 MED ORDER — CITALOPRAM HYDROBROMIDE 20 MG PO TABS: 20.0000 mg | ORAL_TABLET | Freq: Every day | ORAL | 2 refills | Status: DC

## 2018-03-22 NOTE — Telephone Encounter (Signed)
Prescription sent in per dr.panosh orders pt notified

## 2018-04-20 ENCOUNTER — Other Ambulatory Visit: Payer: Self-pay | Admitting: Internal Medicine

## 2018-05-21 ENCOUNTER — Other Ambulatory Visit: Payer: Self-pay | Admitting: Internal Medicine

## 2018-05-24 ENCOUNTER — Ambulatory Visit (INDEPENDENT_AMBULATORY_CARE_PROVIDER_SITE_OTHER): Payer: Self-pay | Admitting: Internal Medicine

## 2018-05-24 ENCOUNTER — Encounter: Payer: Self-pay | Admitting: Internal Medicine

## 2018-05-24 ENCOUNTER — Other Ambulatory Visit: Payer: Self-pay

## 2018-05-24 VITALS — Wt 222.0 lb

## 2018-05-24 DIAGNOSIS — Z79899 Other long term (current) drug therapy: Secondary | ICD-10-CM

## 2018-05-24 DIAGNOSIS — E785 Hyperlipidemia, unspecified: Secondary | ICD-10-CM

## 2018-05-24 DIAGNOSIS — E786 Lipoprotein deficiency: Secondary | ICD-10-CM

## 2018-05-24 DIAGNOSIS — G479 Sleep disorder, unspecified: Secondary | ICD-10-CM

## 2018-05-24 DIAGNOSIS — F4322 Adjustment disorder with anxiety: Secondary | ICD-10-CM

## 2018-05-24 DIAGNOSIS — K219 Gastro-esophageal reflux disease without esophagitis: Secondary | ICD-10-CM

## 2018-05-24 DIAGNOSIS — R739 Hyperglycemia, unspecified: Secondary | ICD-10-CM

## 2018-05-24 DIAGNOSIS — I10 Essential (primary) hypertension: Secondary | ICD-10-CM

## 2018-05-24 DIAGNOSIS — Z8669 Personal history of other diseases of the nervous system and sense organs: Secondary | ICD-10-CM

## 2018-05-24 MED ORDER — CITALOPRAM HYDROBROMIDE 20 MG PO TABS: 20.0000 mg | ORAL_TABLET | Freq: Every day | ORAL | 1 refills | Status: DC

## 2018-05-24 MED ORDER — TRIAMTERENE-HCTZ 37.5-25 MG PO TABS: 1.0000 | ORAL_TABLET | Freq: Every day | ORAL | 2 refills | Status: DC

## 2018-05-24 MED ORDER — PANTOPRAZOLE SODIUM 40 MG PO TBEC: DELAYED_RELEASE_TABLET | ORAL | 1 refills | Status: DC

## 2018-05-24 NOTE — Progress Notes (Signed)
Virtual Visit via Video Note  I connected with@ on 05/24/18 at 12:00 PM EDT by a video enabled telemedicine application and verified that I am speaking with the correct person using two identifiers. Location patient: home Location provider home office Persons participating in the virtual visit: patient, provider  [place]th national recommendations  regarding COVID 19 pandemic   video visit is advised over in office visit for this patient.  Discussed the limitations of evaluation and management by telemedicine and  availability of in person appointments. The patient expressed understanding and agreed to proceed.   HPI: [name] Presents for SDA  Video visit Med eval   Anxiety:  Was off effexor  Weaned and then  began to have increase anxiety and panic   And requested to go back on citalopram . Since then   Doing better and no panic attacks but at times feels almost coming on   No sig se  She was laid off March 23 from [employer] and looking fro job but doing ok ( [employer])     At home  Migraines : seeing dr Sima Matas and back on  botox and emigality and  zonisamide  For better control .  NO more menses since had ablation in the fall   No tob eth   Walking dog daily gets out drifing and outside  Doing  [place] .    Bp running low since lost weight to 220 so far  100 range and asks about poss dec dose of maxide    ROS: See pertinent positives and negatives per HPI. No cp sob    Past Medical History:  Diagnosis Date  . Allergic rhinitis    hx of testing and allergic to spring and fall f= pollens  . Anemia    nos  . Anxiety   . Chest pain    neg stress test summer 2010  . Endometriosis   . Endometriosis   . Fibroids   . GERD (gastroesophageal reflux disease)   . Goiter   . Headache(784.0)   . 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 Hyperlipidemia   . Hypertension   . Irritable bowel syndrome   . Labial cyst 04/08/2011   early infection use hotpcompresses    . Migraines   . Ovarian torsion   . Sinusitis   . STD (sexually transmitted disease)    Pos. Chlamydia--in her 80's  . Thyroid disease    goiter    Past Surgical History:  Procedure Laterality Date  . CESAREAN SECTION  2002, 2004   x2  . IR ANGIOGRAM PELVIS SELECTIVE OR SUPRASELECTIVE  11/11/2017  . IR ANGIOGRAM PELVIS SELECTIVE OR SUPRASELECTIVE  11/11/2017  . IR ANGIOGRAM SELECTIVE EACH ADDITIONAL VESSEL  11/11/2017  . IR ANGIOGRAM SELECTIVE EACH ADDITIONAL VESSEL  11/11/2017  . IR EMBO TUMOR ORGAN ISCHEMIA INFARCT INC GUIDE ROADMAPPING  11/11/2017  . IR RADIOLOGIST EVAL & MGMT  10/11/2017  . IR RADIOLOGIST EVAL & MGMT  11/29/2017  . IR US GUIDE VASC ACCESS RIGHT  11/11/2017  . PELVIC LAPAROSCOPY     x9  . RADIOLOGY WITH ANESTHESIA N/A 09/05/2014   Procedure: MRI OF BRAIN WITHOUT CONTRAST;  Surgeon: Medication Radiologist, MD;  Location: [facility];  Service: Radiology;  Laterality: N/A;  MRI/DR. JAFFE  . TUBAL LIGATION    . UNILATERAL SALPINGECTOMY Right 1995   ectopic pregnancy    Family History  Problem Relation Age of Onset  . Anxiety disorder Mother        mother is on xanax and apparently applying for  social security diabilty for her anxiety. t  . Depression Mother   . Diabetes Mother   . Hypertension Mother   . Heart attack Father   . Seizures Father   . Stroke Maternal Grandmother   . Diabetes Paternal Grandmother   . Hypertension Paternal Grandmother   . Coronary artery disease Unknown   . Diabetes Unknown   . Hyperlipidemia Unknown   . Other Unknown        history of child death in family   . Colon cancer Neg Hx   . Stomach cancer Neg Hx   . Esophageal cancer Neg Hx     Social History   Tobacco Use  . Smoking status: Never Smoker  . Smokeless tobacco: Never Used  Substance Use Topics  . Alcohol use: No    Alcohol/week: 0.0 standard drinks  . Drug use: No      Current Outpatient Medications:  .  atorvastatin  (LIPITOR) 10 MG tablet, Take 1 tablet (10 mg total) by mouth daily., Disp: 90 tablet, Rfl: 0 .  baclofen (LIORESAL) 10 MG tablet, Take 10 mg by mouth 3 (three) times daily as needed for muscle spasms. , Disp: , Rfl: 0 .  citalopram (CELEXA) 20 MG tablet, Take 1 tablet (20 mg total) by mouth daily., Disp: 90 tablet, Rfl: 1 .  docusate sodium (COLACE) 100 MG capsule, Take 1 capsule (100 mg total) by mouth 2 (two) times daily., Disp: 10 capsule, Rfl: 0 .  fluticasone (FLONASE) 50 MCG/ACT nasal spray, Place 1 spray into both nostrils daily., Disp: 16 g, Rfl: 0 .  Galcanezumab-gnlm (EMGALITY [place]), Inject 120 mg into the skin every 30 (thirty) days. , Disp: , Rfl:  .  Multiple Vitamin (MULTIVITAMIN WITH MINERALS) TABS tablet, Take 1 tablet by mouth daily., Disp: , Rfl:  .  OnabotulinumtoxinA (BOTOX IJ), Inject as directed every 3 (three) months., Disp: , Rfl:  .  pantoprazole (PROTONIX) 40 MG tablet, TAKE 1 TABLET(40 MG) BY MOUTH DAILY, Disp: 90 tablet, Rfl: 1 .  promethazine (PHENERGAN) 25 MG tablet, Take 1 tablet (25 mg total) by mouth every 6 (six) hours as needed for nausea or vomiting., Disp: 60 tablet, Rfl: 1 .  triamterene-hydrochlorothiazide (MAXZIDE-25) 37.5-25 MG tablet, Take 1 tablet by mouth daily. Decrease to 1/2 po qd as directed, Disp: 90 tablet, Rfl: 2 .  venlafaxine XR (EFFEXOR-XR) 150 MG 24 hr capsule, TAKE 1 CAPSULE(150 MG) BY MOUTH DAILY WITH BREAKFAST (Patient taking differently: Take 150 mg by mouth daily with breakfast. ), Disp: 90 capsule, Rfl: 0 .  zonisamide (ZONEGRAN) 100 MG capsule, Take 100-300 mg by mouth 2 (two) times daily. , Disp: , Rfl: 1  EXAM: BP Readings from Last 3 Encounters:  11/29/17 (!) 142/89  11/12/17 (!) 142/82  11/12/17 116/84   Wt Readings from Last 3 Encounters:  05/24/18 222 lb (100.7 kg)  11/29/17 222 lb (100.7 kg)  11/11/17 223 lb (101.2 kg)    VITALS per patient if applicable: loks well  Reported  bp   100/62 range  Weight 222  GENERAL: alert,  oriented, appears well and in no acute distress  HEENT: atraumatic, conjunttiva clear, no obvious abnormalities on inspection of external nose and ears  NECK: normal movements of the head and neck  LUNGS: on inspection no signs of respiratory distress, breathing rate appears normal, no  obvious gross SOB, gasping or wheezing  CV: no obvious cyanosis  MS: moves all visible extremities without noticeable abnormality  PSYCH/NEURO: pleasant and cooperative, no obvious depression or anxiety, speech and thought processing grossly intact Lab Results  Component Value Date   WBC 8.4 11/11/2017   HGB 12.4 11/11/2017   HCT 38.2 11/11/2017   PLT 471 (H) 11/11/2017   GLUCOSE 111 (H) 11/11/2017   CHOL 264 (H) 07/21/2017   TRIG 147.0 07/21/2017   HDL 42.70 07/21/2017   LDLDIRECT 167.0 06/07/2017   LDLCALC 192 (H) 07/21/2017   ALT 10 05/24/2017   AST 12 05/24/2017   NA 143 11/11/2017   K 3.1 (L) 11/11/2017   CL 114 (H) 11/11/2017   CREATININE 0.89 11/11/2017   BUN 11 11/11/2017   CO2 20 (L) 11/11/2017   TSH 1.40 07/21/2017   INR 1.01 11/11/2017   HGBA1C 6.0 06/07/2017    ASSESSMENT AND PLAN:  Discussed the following assessment and plan:  Adjustment disorder with anxious mood - improved on citalopram  reasonabley controlling panic disorder  - Plan: Basic metabolic panel, CBC with Differential/Platelet, Hemoglobin A1c, Hepatic function panel, TSH, T4, free  Medication management - due for lab monitoring  - Plan: Basic metabolic panel, CBC with Differential/Platelet, Hemoglobin A1c, Hepatic function panel, Lipid panel, TSH, T4, free  Hyperlipidemia, unspecified hyperlipidemia type - Plan: Basic metabolic panel, CBC with Differential/Platelet, Hemoglobin A1c, Hepatic function panel, Lipid panel, TSH, T4, free  Hyperglycemia - Plan: Basic metabolic panel, CBC with Differential/Platelet, Hemoglobin A1c, Hepatic function panel, Lipid panel, TSH, T4, free  Low HDL (under 40) - Plan: Basic  metabolic panel, CBC with Differential/Platelet, Hemoglobin A1c, Hepatic function panel, Lipid panel, TSH, T4, free  Sleep disturbance - Plan: Basic metabolic panel, CBC with Differential/Platelet, Hemoglobin A1c, Hepatic function panel, Lipid panel, TSH, T4, free  Hx of atypical migraine - Plan: Basic metabolic panel, CBC with Differential/Platelet, Hemoglobin A1c, Hepatic function panel, Lipid panel, TSH, T4, free  Essential hypertension - bp running 100 range ok to dec to 1/53maxide per day for now cont LSI - Plan: Basic metabolic panel, CBC with Differential/Platelet, Hemoglobin A1c, Hepatic function panel, Lipid panel, TSH, T4, free  Gastroesophageal reflux disease, esophagitis presence not specified - protonix congtrolled consider wean in futuer with diet and weight changes   but will refill  and stay on this today  Counseled.  each above issues . Lab and  Need cpx in the summer or when  Safer .  Expectant management and discussion of plan and treatment with patient with opportunity to ask questions and all were answered. The patient agreed with the plan and demonstrated an understanding of the instructions.   The patient was advised to call back or seek an in-person evaluation if worsening  or having concerns .  [name], MD

## 2018-05-29 ENCOUNTER — Other Ambulatory Visit: Payer: Self-pay

## 2018-05-29 ENCOUNTER — Other Ambulatory Visit (INDEPENDENT_AMBULATORY_CARE_PROVIDER_SITE_OTHER): Payer: Self-pay

## 2018-05-29 DIAGNOSIS — G479 Sleep disorder, unspecified: Secondary | ICD-10-CM

## 2018-05-29 DIAGNOSIS — Z8669 Personal history of other diseases of the nervous system and sense organs: Secondary | ICD-10-CM

## 2018-05-29 DIAGNOSIS — F4322 Adjustment disorder with anxiety: Secondary | ICD-10-CM

## 2018-05-29 DIAGNOSIS — E786 Lipoprotein deficiency: Secondary | ICD-10-CM

## 2018-05-29 DIAGNOSIS — R739 Hyperglycemia, unspecified: Secondary | ICD-10-CM

## 2018-05-29 DIAGNOSIS — Z79899 Other long term (current) drug therapy: Secondary | ICD-10-CM

## 2018-05-29 DIAGNOSIS — I10 Essential (primary) hypertension: Secondary | ICD-10-CM

## 2018-05-29 DIAGNOSIS — E785 Hyperlipidemia, unspecified: Secondary | ICD-10-CM

## 2018-05-29 LAB — HEPATIC FUNCTION PANEL
ALT: 12 U/L (ref 0–35)
AST: 14 U/L (ref 0–37)
Albumin: 4.2 g/dL (ref 3.5–5.2)
Alkaline Phosphatase: 69 U/L (ref 39–117)
Bilirubin, Direct: 0 mg/dL (ref 0.0–0.3)
Total Bilirubin: 0.3 mg/dL (ref 0.2–1.2)
Total Protein: 6.7 g/dL (ref 6.0–8.3)

## 2018-05-29 LAB — CBC WITH DIFFERENTIAL/PLATELET
Basophils Absolute: 0.1 10*3/uL (ref 0.0–0.1)
Basophils Relative: 1 % (ref 0.0–3.0)
Eosinophils Absolute: 0.2 10*3/uL (ref 0.0–0.7)
Eosinophils Relative: 2.8 % (ref 0.0–5.0)
HCT: 38.5 % (ref 36.0–46.0)
Hemoglobin: 12.7 g/dL (ref 12.0–15.0)
Lymphocytes Relative: 46 % (ref 12.0–46.0)
Lymphs Abs: 4.1 10*3/uL — ABNORMAL HIGH (ref 0.7–4.0)
MCHC: 33.1 g/dL (ref 30.0–36.0)
MCV: 90.2 fl (ref 78.0–100.0)
Monocytes Absolute: 0.5 10*3/uL (ref 0.1–1.0)
Monocytes Relative: 6 % (ref 3.0–12.0)
Neutro Abs: 3.9 10*3/uL (ref 1.4–7.7)
Neutrophils Relative %: 44.2 % (ref 43.0–77.0)
Platelets: 401 10*3/uL — ABNORMAL HIGH (ref 150.0–400.0)
RBC: 4.27 Mil/uL (ref 3.87–5.11)
RDW: 14.3 % (ref 11.5–15.5)
WBC: 8.8 10*3/uL (ref 4.0–10.5)

## 2018-05-29 LAB — LIPID PANEL
Cholesterol: 253 mg/dL — ABNORMAL HIGH (ref 0–200)
HDL: 44 mg/dL (ref >39.00)
LDL Cholesterol: 182 mg/dL — ABNORMAL HIGH (ref 0–99)
NonHDL: 209.36
Total CHOL/HDL Ratio: 6
Triglycerides: 135 mg/dL (ref 0.0–149.0)
VLDL: 27 mg/dL (ref 0.0–40.0)

## 2018-05-29 LAB — BASIC METABOLIC PANEL
BUN: 12 mg/dL (ref 6–23)
CO2: 25 mEq/L (ref 19–32)
Calcium: 9.2 mg/dL (ref 8.4–10.5)
Chloride: 104 mEq/L (ref 96–112)
Creatinine, Ser: 0.98 mg/dL (ref 0.40–1.20)
GFR: 73.55 mL/min (ref >60.00)
Glucose, Bld: 99 mg/dL (ref 70–99)
Potassium: 3.7 mEq/L (ref 3.5–5.1)
Sodium: 139 mEq/L (ref 135–145)

## 2018-05-29 LAB — T4, FREE: Free T4: 0.8 ng/dL (ref 0.60–1.60)

## 2018-05-29 LAB — HEMOGLOBIN A1C: Hgb A1c MFr Bld: 6.1 % (ref 4.6–6.5)

## 2018-05-29 LAB — TSH: TSH: 3.02 u[IU]/mL (ref 0.35–4.50)

## 2018-05-30 NOTE — Telephone Encounter (Signed)
plese refer to my result note    And if ok refill the atorvastatin   X 6 months  ( 90 days x 2) and then plan fu lipids check in  3-4 months

## 2018-05-31 ENCOUNTER — Other Ambulatory Visit: Payer: Self-pay

## 2018-05-31 MED ORDER — ATORVASTATIN CALCIUM 10 MG PO TABS: 10.0000 mg | ORAL_TABLET | Freq: Every day | ORAL | 2 refills | Status: DC

## 2018-06-22 ENCOUNTER — Telehealth: Payer: Self-pay | Admitting: Obstetrics and Gynecology

## 2018-06-22 ENCOUNTER — Other Ambulatory Visit: Payer: Self-pay

## 2018-06-22 ENCOUNTER — Ambulatory Visit: Payer: Self-pay | Admitting: Obstetrics and Gynecology

## 2018-06-22 NOTE — Telephone Encounter (Signed)
Thank you for the update.  Encounter closed. 

## 2018-06-22 NOTE — Telephone Encounter (Signed)
Patient cancelled problem visit today. See staff message.

## 2018-07-07 ENCOUNTER — Other Ambulatory Visit: Payer: Self-pay | Admitting: Interventional Radiology

## 2018-07-07 DIAGNOSIS — D250 Submucous leiomyoma of uterus: Secondary | ICD-10-CM

## 2018-07-20 ENCOUNTER — Telehealth: Payer: Medicaid Other

## 2018-07-23 ENCOUNTER — Other Ambulatory Visit: Payer: Self-pay | Admitting: Internal Medicine

## 2018-07-25 ENCOUNTER — Other Ambulatory Visit: Payer: Self-pay | Admitting: Internal Medicine

## 2018-08-15 ENCOUNTER — Other Ambulatory Visit: Payer: Self-pay

## 2018-08-15 ENCOUNTER — Ambulatory Visit
Admission: RE | Admit: 2018-08-15 | Discharge: 2018-08-15 | Disposition: A | Discharge status: Home / Self Care | Payer: Medicaid Other | Source: Ambulatory Visit | Attending: Interventional Radiology | Admitting: Interventional Radiology

## 2018-08-15 ENCOUNTER — Encounter: Payer: Self-pay | Admitting: *Deleted

## 2018-08-15 DIAGNOSIS — D250 Submucous leiomyoma of uterus: Secondary | ICD-10-CM

## 2018-08-15 HISTORY — PX: IR RADIOLOGIST EVAL & MGMT: IMG5224

## 2018-08-15 NOTE — Progress Notes (Signed)
Patient ID: [name], female   DOB: 1970/06/13, 48 y.o.   MRN: [id number]         Chief Complaint: Post UFE  Referring Physician(s): Quincy Simmonds  History of Present Illness: [name] is a 48 y.o. (g7, p2)femalewith past medical history significant for hypertension, hyperlipidemia, thyroid disease, STD (chlamydia and HSV type II), ovarian torsion (post oophorectomy and salpingectomy)andendometriosis who returns today to the interventional radiology clinic following successful bilateral uterine artery embolization performed 11/11/2017. Patient is seen today in follow-up consultation via telemedicine.  In brief review, patient's primary complaint regarding her fibroids was due to significant menorrhagia.  Patient states that she has only experienced 1 cycle since the performance of the procedure.  Additionally, she admits to experiencing hot flashes.  She also notes a family history of early onset of menopause.  Patient states she is overall pleased with the procedure and is without complaint.   Past Medical History:  Diagnosis Date  . Allergic rhinitis    hx of testing and allergic to spring and fall f= pollens  . Anemia    nos  . Anxiety   . Chest pain    neg stress test summer 2010  . Endometriosis   . Endometriosis   . Fibroids   . GERD (gastroesophageal reflux disease)   . Goiter   . Headache(784.0)   . 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 Hyperlipidemia   . Hypertension   . Irritable bowel syndrome   . Labial cyst 04/08/2011   early infection use hotpcompresses    . Migraines   . Ovarian torsion   . Sinusitis   . STD (sexually transmitted disease)    Pos. Chlamydia--in her 67's  . Thyroid disease    goiter    Past Surgical History:  Procedure Laterality Date  . CESAREAN SECTION  2002, 2004   x2  . IR ANGIOGRAM PELVIS SELECTIVE OR SUPRASELECTIVE  11/11/2017  . IR ANGIOGRAM PELVIS SELECTIVE OR  SUPRASELECTIVE  11/11/2017  . IR ANGIOGRAM SELECTIVE EACH ADDITIONAL VESSEL  11/11/2017  . IR ANGIOGRAM SELECTIVE EACH ADDITIONAL VESSEL  11/11/2017  . IR EMBO TUMOR ORGAN ISCHEMIA INFARCT INC GUIDE ROADMAPPING  11/11/2017  . IR RADIOLOGIST EVAL & MGMT  10/11/2017  . IR RADIOLOGIST EVAL & MGMT  11/29/2017  . IR US GUIDE VASC ACCESS RIGHT  11/11/2017  . PELVIC LAPAROSCOPY     x9  . RADIOLOGY WITH ANESTHESIA N/A 09/05/2014   Procedure: MRI OF BRAIN WITHOUT CONTRAST;  Surgeon: Medication Radiologist, MD;  Location: [facility];  Service: Radiology;  Laterality: N/A;  MRI/DR. JAFFE  . TUBAL LIGATION    . UNILATERAL SALPINGECTOMY Right 1995   ectopic pregnancy    Allergies: Clonidine derivatives, Codeine phosphate, Moxifloxacin, Sulfamethoxazole, and Sumatriptan succinate  Medications: Prior to Admission medications   Medication Sig Start Date End Date Taking? Authorizing Provider  atorvastatin (LIPITOR) 10 MG tablet Take 1 tablet (10 mg total) by mouth daily. 05/31/18   Panosh, Standley Brooking, MD  baclofen (LIORESAL) 10 MG tablet Take 10 mg by mouth 3 (three) times daily as needed for muscle spasms.  01/06/16   [provider]  citalopram (CELEXA) 20 MG tablet Take 1 tablet (20 mg total) by mouth daily. 05/24/18   Panosh, Standley Brooking, MD  docusate sodium (COLACE) 100 MG capsule Take 1 capsule (100 mg total) by mouth 2 (two) times daily. 11/12/17   Ardis Rowan, PA-C  fluticasone (FLONASE) 50 MCG/ACT nasal spray Place 1 spray into both nostrils daily. 07/22/17   Billie Ruddy, MD  Galcanezumab-gnlm (EMGALITY [place]) Inject 120 mg into the skin every 30 (thirty) days.     [provider]  Multiple Vitamin (MULTIVITAMIN WITH MINERALS) TABS tablet Take 1 tablet by mouth daily.    [provider]  OnabotulinumtoxinA (BOTOX IJ) Inject as directed every 3 (three) months.    [provider]  pantoprazole (PROTONIX) 40 MG tablet TAKE 1 TABLET BY MOUTH EVERY DAY 07/27/18   Panosh,  Standley Brooking, MD  promethazine (PHENERGAN) 25 MG tablet Take 1 tablet (25 mg total) by mouth every 6 (six) hours as needed for nausea or vomiting. 07/08/15   Pieter Partridge, DO  triamterene-hydrochlorothiazide (MAXZIDE-25) 37.5-25 MG tablet TAKE 1 TABLET BY MOUTH DAILY 07/27/18   Panosh, Standley Brooking, MD  venlafaxine XR (EFFEXOR-XR) 150 MG 24 hr capsule TAKE 1 CAPSULE(150 MG) BY MOUTH DAILY WITH BREAKFAST Patient taking differently: Take 150 mg by mouth daily with breakfast.  10/04/17   Panosh, Standley Brooking, MD  zonisamide (ZONEGRAN) 100 MG capsule Take 100-300 mg by mouth 2 (two) times daily.  04/08/16   [provider]     Family History  Problem Relation Age of Onset  . Anxiety disorder Mother        mother is on xanax and apparently applying for  social security diabilty for her anxiety. t  . Depression Mother   . Diabetes Mother   . Hypertension Mother   . Heart attack Father   . Seizures Father   . Stroke Maternal Grandmother   . Diabetes Paternal Grandmother   . Hypertension Paternal Grandmother   . Coronary artery disease Unknown   . Diabetes Unknown   . Hyperlipidemia Unknown   . Other Unknown        history of child death in family   . Colon cancer Neg Hx   . Stomach cancer Neg Hx   . Esophageal cancer Neg Hx     Social History   Socioeconomic History  . Marital status: Single    Spouse name: Not on file  . Number of children: Not on file  . Years of education: Not on file  . Highest education level: Not on file  Occupational History  . Not on file  Social Needs  . Financial resource strain: Not on file  . Food insecurity    Worry: Not on file    Inability: Not on file  . Transportation needs    Medical: Not on file    Non-medical: Not on file  Tobacco Use  . Smoking status: Never Smoker  . Smokeless tobacco: Never Used  Substance and Sexual Activity  . Alcohol use: No    Alcohol/week: 0.0 standard drinks  . Drug use: No  . Sexual activity: Yes    Partners: Male     Birth control/protection: Surgical    Comment: Tubal  Lifestyle  . Physical activity    Days per week: Not on file    Minutes per session: Not on file  . Stress: Not on file  Relationships  . Social Herbalist on phone: Not on file    Gets together: Not on file    Attends religious service: Not on file    Active member of club or organization: Not on file    Attends meetings of clubs or organizations: Not on file    Relationship status: Not  on file  Other Topics Concern  . 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   And graduated as SW.      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  All other systems are negative.  Physical Exam No direct physical exam was performed (except for noted visual exam findings with Video Visits).   Vital Signs: There were no vitals taken for this visit.  Imaging:  Selected images from preprocedural contrast-enhanced pelvic MRI performed 10/19/2017 as well as intraprocedural images during bilateral uterine artery embolization performed 11/11/2017 were reviewed in detail.  Labs:  CBC: Recent Labs    11/11/17 1228 05/29/18 1033  WBC 8.4 8.8  HGB 12.4 12.7  HCT 38.2 38.5  PLT 471* 401.0*    COAGS: Recent Labs    11/11/17 1228  INR 1.01  APTT 27    BMP: Recent Labs    10/19/17 1907 11/11/17 1228 05/29/18 1033   NA  --  143 139  K  --  3.1* 3.7  CL  --  114* 104  CO2  --  20* 25  GLUCOSE  --  111* 99  BUN  --  11 12  CALCIUM  --  9.0 9.2  CREATININE 1.00 0.89 0.98  GFRNONAA  --  >60  --   GFRAA  --  >60  --     LIVER FUNCTION TESTS: Recent Labs    05/29/18 1033  BILITOT 0.3  AST 14  ALT 12  ALKPHOS 69  PROT 6.7  ALBUMIN 4.2    TUMOR MARKERS: No results for input(s): AFPTM, CEA, CA199, CHROMGRNA in the last 8760 hours.  Assessment and Plan:  [name] is a 48 y.o. (g7, p2)femalewith past medical history significant for hypertension, hyperlipidemia, thyroid disease, STD (chlamydia and HSV type II), ovarian torsion (post oophorectomy and salpingectomy)andendometriosis who returns today to the interventional radiology clinic following successful bilateral uterine artery embolization performed 11/11/2017.  Patient's primary complaint regarding her fibroids was due to significant menorrhagia however since undergoing the procedure the patient has experienced only 1 cycle.  Given this, the patient is pleased with the procedure.  As the patient is pleased with the outcome of the uterine fibroid embolization, postprocedural contrast-enhanced pelvic MRI will NOT be obtained.  I explained that as the patient is (at least) perimenopausal, I am hopeful that she will have a durable result until the onset of definitive menopause.  The patient knows to call the interventional radiology clinic with any future questions or concerns though may otherwise follow-up on a PRN basis.  A copy of this report was sent to the requesting provider on this date.  Electronically Signed: Sandi Mariscal 08/15/2018,  11:32 AM   I spent a total of 10 Minutes in remote  clinical consultation, greater than 50% of which was counseling/coordinating care for post UFE.    Visit type: Audio only (telephone). Audio (no video) only due to patient's lack of internet/smartphone capability. Alternative for in-person  consultation at [facility], [address], [place], [place]. This visit type was conducted due to national recommendations for restrictions regarding the COVID-19 Pandemic (e.g. social distancing).  This format is felt to be most appropriate for this patient at this time.  All issues noted in this document were discussed and addressed.

## 2018-08-31 ENCOUNTER — Encounter: Payer: Self-pay | Admitting: Internal Medicine

## 2018-08-31 ENCOUNTER — Telehealth: Payer: Self-pay | Admitting: Internal Medicine

## 2018-08-31 LAB — NOVEL CORONAVIRUS, NAA: SARS-CoV-2, NAA: POSITIVE

## 2018-08-31 NOTE — Telephone Encounter (Signed)
FYI: Pt called to advised PCP that she tested positive for covid. Pt took a test in [place] advise

## 2018-08-31 NOTE — Telephone Encounter (Signed)
fyi

## 2018-08-31 NOTE — Telephone Encounter (Signed)
See note

## 2018-09-01 NOTE — Telephone Encounter (Signed)
Pt states she is feeling okay and that she said had a sinus infection on top of it and is having mild symptoms. Pt notified that if she needs anything to let us know

## 2018-09-01 NOTE — Telephone Encounter (Signed)
lvm for pt to call back pt needs an appt to be able to do note. Okay to work in today at the 3 slot but would have appt around 3:30

## 2018-09-01 NOTE — Telephone Encounter (Signed)
So call her  And see how she is doing   And let us know how we can help .

## 2018-09-01 NOTE — Telephone Encounter (Signed)
Patient called in stating she is needing to have a note for medical clearance to go back to work when quarantine is over. Over on 10th. Please call back [phone]. Please advise.

## 2018-09-03 NOTE — Progress Notes (Signed)
Virtual Visit via Video Note  I connected with@ on 09/04/18 at 10:30 AM EDT by a video enabled telemedicine application and verified that I am speaking with the correct person using two identifiers. Location patient: home Location provider:workoffice Persons participating in the virtual visit: patient, provider  [place] national recommendations  regarding COVID 19 pandemic   video visit is advised over in office visit for this patient.  Patient aware  of the limitations of evaluation and management by telemedicine and  availability of in person appointments. and agreed to proceed.   HPI: [name] presents for video visit  Tested potivie for covid 19 on August 3  And results August 5 and needs note to clear for back to work. Had onset of mild tickle in throat around July 28 got sick sx  August 1 with body aches low grade temp and  Cough and loss of smell and felt like a sinus infection   Went to UC and rx for sinusitis with Augmentin  And bromfed for cough which has helpedand tested for covid  .  Is in isolation at home [facility] of 2 .  Had just started new job   . Act   Feeling better at this time but still tired  No sob   coughh kess no sob. ROS: See pertinent positives and negatives per HPI.  Past Medical History:  Diagnosis Date  . Allergic rhinitis    hx of testing and allergic to spring and fall f= pollens  . Anemia    nos  . Anxiety   . Chest pain    neg stress test summer 2010  . Endometriosis   . Endometriosis   . Fibroids   . GERD (gastroesophageal reflux disease)   . Goiter   . Headache(784.0)   . 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 Hyperlipidemia   . Hypertension   . Irritable bowel syndrome   . Labial cyst 04/08/2011   early infection use hotpcompresses    . Migraines   . Ovarian torsion   . Sinusitis   . STD (sexually transmitted disease)    Pos. Chlamydia--in her 74's  . Thyroid disease    goiter    Past  Surgical History:  Procedure Laterality Date  . CESAREAN SECTION  2002, 2004   x2  . IR ANGIOGRAM PELVIS SELECTIVE OR SUPRASELECTIVE  11/11/2017  . IR ANGIOGRAM PELVIS SELECTIVE OR SUPRASELECTIVE  11/11/2017  . IR ANGIOGRAM SELECTIVE EACH ADDITIONAL VESSEL  11/11/2017  . IR ANGIOGRAM SELECTIVE EACH ADDITIONAL VESSEL  11/11/2017  . IR EMBO TUMOR ORGAN ISCHEMIA INFARCT INC GUIDE ROADMAPPING  11/11/2017  . IR RADIOLOGIST EVAL & MGMT  10/11/2017  . IR RADIOLOGIST EVAL & MGMT  11/29/2017  . IR RADIOLOGIST EVAL & MGMT  08/15/2018  . IR US GUIDE VASC ACCESS RIGHT  11/11/2017  . PELVIC LAPAROSCOPY     x9  . RADIOLOGY WITH ANESTHESIA N/A 09/05/2014   Procedure: MRI OF BRAIN WITHOUT CONTRAST;  Surgeon: Medication Radiologist, MD;  Location: [facility];  Service: Radiology;  Laterality: N/A;  MRI/DR. JAFFE  . TUBAL LIGATION    . UNILATERAL SALPINGECTOMY Right 1995   ectopic pregnancy    Family History  Problem Relation Age of Onset  . Anxiety disorder Mother        mother is on xanax and apparently applying for  social security diabilty for her anxiety. t  . Depression Mother   .  Diabetes Mother   . Hypertension Mother   . Heart attack Father   . Seizures Father   . Stroke Maternal Grandmother   . Diabetes Paternal Grandmother   . Hypertension Paternal Grandmother   . Coronary artery disease Unknown   . Diabetes Unknown   . Hyperlipidemia Unknown   . Other Unknown        history of child death in family   . Colon cancer Neg Hx   . Stomach cancer Neg Hx   . Esophageal cancer Neg Hx     Social History   Tobacco Use  . Smoking status: Never Smoker  . Smokeless tobacco: Never Used  Substance Use Topics  . Alcohol use: No    Alcohol/week: 0.0 standard drinks  . Drug use: No      Current Outpatient Medications:  .  atorvastatin (LIPITOR) 10 MG tablet, Take 1 tablet (10 mg total) by mouth daily., Disp: 90 tablet, Rfl: 2 .  baclofen (LIORESAL) 10 MG tablet, Take 10 mg by mouth 3  (three) times daily as needed for muscle spasms. , Disp: , Rfl: 0 .  citalopram (CELEXA) 20 MG tablet, Take 1 tablet (20 mg total) by mouth daily., Disp: 90 tablet, Rfl: 1 .  docusate sodium (COLACE) 100 MG capsule, Take 1 capsule (100 mg total) by mouth 2 (two) times daily., Disp: 10 capsule, Rfl: 0 .  fluticasone (FLONASE) 50 MCG/ACT nasal spray, Place 1 spray into both nostrils daily., Disp: 16 g, Rfl: 0 .  Galcanezumab-gnlm (EMGALITY [place]), Inject 120 mg into the skin every 30 (thirty) days. , Disp: , Rfl:  .  Multiple Vitamin (MULTIVITAMIN WITH MINERALS) TABS tablet, Take 1 tablet by mouth daily., Disp: , Rfl:  .  OnabotulinumtoxinA (BOTOX IJ), Inject as directed every 3 (three) months., Disp: , Rfl:  .  pantoprazole (PROTONIX) 40 MG tablet, TAKE 1 TABLET BY MOUTH EVERY DAY, Disp: 90 tablet, Rfl: 1 .  promethazine (PHENERGAN) 25 MG tablet, Take 1 tablet (25 mg total) by mouth every 6 (six) hours as needed for nausea or vomiting., Disp: 60 tablet, Rfl: 1 .  triamterene-hydrochlorothiazide (MAXZIDE-25) 37.5-25 MG tablet, TAKE 1 TABLET BY MOUTH DAILY, Disp: 90 tablet, Rfl: 2 .  venlafaxine XR (EFFEXOR-XR) 150 MG 24 hr capsule, TAKE 1 CAPSULE(150 MG) BY MOUTH DAILY WITH BREAKFAST (Patient taking differently: Take 150 mg by mouth daily with breakfast. ), Disp: 90 capsule, Rfl: 0 .  zonisamide (ZONEGRAN) 100 MG capsule, Take 100-300 mg by mouth 2 (two) times daily. , Disp: , Rfl: 1  EXAM: BP Readings from Last 3 Encounters:  11/29/17 (!) 142/89  11/12/17 (!) 142/82  11/12/17 116/84    VITALS per patient if applicable:  GENERAL: alert, oriented, appears well and in no acute distress masked   Looks well  HEENT: atraumatic, conjunttiva clear, no obvious abnormalities on inspection of external nose and ears NECK: normal movements of the head and neck LUNGS: on inspection no signs of respiratory distress, breathing rate appears normal, no obvious gross SOB, gasping or wheezing CV: no obvious  cyanosis PSYCH/NEURO: pleasant and cooperative, no obvious depression or anxiety, speech and thought processing grossly intact   ASSESSMENT AND PLAN:  Discussed the following assessment and plan:  No diagnosis found. Under rx for sinusitis and pos COVID19 on August 3 test.  Improving still with sx  But no fever  Note for return to work   Can work from home as tolerated  Expect RTW Monday AUGUST 17  Counseled.   Expectant management and discussion of plan and treatment with opportunity to ask questions and all were answered. The patient agreed with the plan and demonstrated an understanding of the instructions.   Advised to call back or seek an in-person evaluation if worsening  or having  further concerns .  [name], MD

## 2018-09-04 ENCOUNTER — Telehealth (INDEPENDENT_AMBULATORY_CARE_PROVIDER_SITE_OTHER): Payer: No Typology Code available for payment source | Admitting: Internal Medicine

## 2018-09-04 ENCOUNTER — Encounter: Payer: Self-pay | Admitting: Internal Medicine

## 2018-09-04 ENCOUNTER — Other Ambulatory Visit: Payer: Self-pay

## 2018-09-04 DIAGNOSIS — U071 COVID-19: Secondary | ICD-10-CM

## 2018-09-13 NOTE — Telephone Encounter (Signed)
Most patients with Covid-19 infections feel bad for several weeks, but some take up to 4-6 weeks to get over it.  Give it some more time

## 2018-09-14 ENCOUNTER — Ambulatory Visit: Payer: Self-pay | Admitting: *Deleted

## 2018-09-14 ENCOUNTER — Telehealth (INDEPENDENT_AMBULATORY_CARE_PROVIDER_SITE_OTHER): Payer: Medicaid Other | Admitting: Family Medicine

## 2018-09-14 ENCOUNTER — Other Ambulatory Visit: Payer: Self-pay

## 2018-09-14 DIAGNOSIS — R0981 Nasal congestion: Secondary | ICD-10-CM

## 2018-09-14 DIAGNOSIS — U071 COVID-19: Secondary | ICD-10-CM

## 2018-09-14 DIAGNOSIS — J988 Other specified respiratory disorders: Secondary | ICD-10-CM

## 2018-09-14 DIAGNOSIS — R5383 Other fatigue: Secondary | ICD-10-CM

## 2018-09-14 NOTE — Progress Notes (Signed)
Virtual Visit via Video Note  I connected with [name]  on 09/14/18 at  3:00 PM EDT by a video enabled telemedicine application and verified that I am speaking with the correct person using two identifiers.  Location patient: home Location provider:work or home office Persons participating in the virtual visit: patient, provider   HPI:  Acute visit for sinus issues: -had COVID 19 earlier this month with positive COVID19 test 4th -she was also treated for a sinus infection with Augmentin for 10 days for a sinusitis and a shot of steroid -she felt better and went back to work last week -but, started feeling sick again a few days later -symptoms now include: sinus congestion and pressure, ears feel full, fatigue, nausea, occ cough -denies SOB, wheezing, vomiting, diarrhea, temp over 100 -temp today 98.1 -no known sick exposures in the last week -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  Past Medical History:  Diagnosis Date  . Allergic rhinitis    hx of testing and allergic to spring and fall f= pollens  . Anemia    nos  . Anxiety   . Chest pain    neg stress test summer 2010  . Endometriosis   . Endometriosis   . Fibroids   . GERD (gastroesophageal reflux disease)   . Goiter   . Headache(784.0)   . 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 Hyperlipidemia   . Hypertension   . Irritable bowel syndrome   . Labial cyst 04/08/2011   early infection use hotpcompresses    . Migraines   . Ovarian torsion   . Sinusitis   . STD (sexually transmitted disease)    Pos. Chlamydia--in her 79's  . Thyroid disease    goiter    Past Surgical History:  Procedure Laterality Date  . CESAREAN SECTION  2002, 2004   x2   . IR ANGIOGRAM PELVIS SELECTIVE OR SUPRASELECTIVE  11/11/2017  . IR ANGIOGRAM PELVIS SELECTIVE OR SUPRASELECTIVE  11/11/2017  . IR ANGIOGRAM SELECTIVE EACH ADDITIONAL VESSEL  11/11/2017  . IR ANGIOGRAM SELECTIVE EACH ADDITIONAL VESSEL  11/11/2017  . IR EMBO TUMOR ORGAN ISCHEMIA INFARCT INC GUIDE ROADMAPPING  11/11/2017  . IR RADIOLOGIST EVAL & MGMT  10/11/2017  . IR RADIOLOGIST EVAL & MGMT  11/29/2017  . IR RADIOLOGIST EVAL & MGMT  08/15/2018  . IR US GUIDE VASC ACCESS RIGHT  11/11/2017  . PELVIC LAPAROSCOPY     x9  . RADIOLOGY WITH ANESTHESIA N/A 09/05/2014   Procedure: MRI OF BRAIN WITHOUT CONTRAST;  Surgeon: Medication Radiologist, MD;  Location: [facility];  Service: Radiology;  Laterality: N/A;  MRI/DR. JAFFE  . TUBAL LIGATION    . UNILATERAL SALPINGECTOMY Right 1995   ectopic pregnancy    Family History  Problem Relation Age of Onset  . Anxiety disorder Mother        mother is on xanax and apparently applying for  social security diabilty for her anxiety. t  . Depression Mother   . Diabetes Mother   . Hypertension Mother   . Heart attack Father   . Seizures Father   . Stroke Maternal Grandmother   . Diabetes  Paternal Grandmother   . Hypertension Paternal Grandmother   . Coronary artery disease Unknown   . Diabetes Unknown   . Hyperlipidemia Unknown   . Other Unknown        history of child death in family   . Colon cancer Neg Hx   . Stomach cancer Neg Hx   . Esophageal cancer Neg Hx     SOCIAL HX: see hpi   Current Outpatient Medications:  .  atorvastatin (LIPITOR) 10 MG tablet, Take 1 tablet (10 mg total) by mouth daily., Disp: 90 tablet, Rfl: 2 .  baclofen (LIORESAL) 10 MG tablet, Take 10 mg by mouth 3 (three) times daily as needed for muscle spasms. , Disp: , Rfl: 0 .  citalopram (CELEXA) 20 MG tablet, Take 1 tablet (20 mg total) by mouth daily., Disp: 90 tablet, Rfl: 1 .  docusate sodium (COLACE) 100 MG capsule, Take 1 capsule (100 mg total) by mouth 2 (two) times  daily., Disp: 10 capsule, Rfl: 0 .  fluticasone (FLONASE) 50 MCG/ACT nasal spray, Place 1 spray into both nostrils daily., Disp: 16 g, Rfl: 0 .  Galcanezumab-gnlm (EMGALITY [place]), Inject 120 mg into the skin every 30 (thirty) days. , Disp: , Rfl:  .  Multiple Vitamin (MULTIVITAMIN WITH MINERALS) TABS tablet, Take 1 tablet by mouth daily., Disp: , Rfl:  .  OnabotulinumtoxinA (BOTOX IJ), Inject as directed every 3 (three) months., Disp: , Rfl:  .  pantoprazole (PROTONIX) 40 MG tablet, TAKE 1 TABLET BY MOUTH EVERY DAY, Disp: 90 tablet, Rfl: 1 .  promethazine (PHENERGAN) 25 MG tablet, Take 1 tablet (25 mg total) by mouth every 6 (six) hours as needed for nausea or vomiting., Disp: 60 tablet, Rfl: 1 .  triamterene-hydrochlorothiazide (MAXZIDE-25) 37.5-25 MG tablet, TAKE 1 TABLET BY MOUTH DAILY, Disp: 90 tablet, Rfl: 2 .  venlafaxine XR (EFFEXOR-XR) 150 MG 24 hr capsule, TAKE 1 CAPSULE(150 MG) BY MOUTH DAILY WITH BREAKFAST (Patient taking differently: Take 150 mg by mouth daily with breakfast. ), Disp: 90 capsule, Rfl: 0 .  zonisamide (ZONEGRAN) 100 MG capsule, Take 100-300 mg by mouth 2 (two) times daily. , Disp: , Rfl: 1  EXAM:  VITALS per patient if applicable: denies fever  GENERAL: alert, oriented, appears well and in no acute distress  HEENT: atraumatic, conjunttiva clear, no obvious abnormalities on inspection of external nose and ears  NECK: normal movements of the head and neck  LUNGS: on inspection no signs of respiratory distress, breathing rate appears normal, no obvious gross SOB, gasping or wheezing  CV: no obvious cyanosis  MS: moves all visible extremities without noticeable abnormality  PSYCH/NEURO: pleasant and cooperative, no obvious depression or anxiety, speech and thought processing grossly intact  ASSESSMENT AND PLAN:  Discussed the following assessment and plan:  Sinus congestion  Other fatigue  COVID-19  -we discussed possible serious and likely etiologies,  workup and treatment, treatment risks and return precautions. Suspect this is related to recovering from [place] possible allergies vs other. -after this discussion, [name] opted for: We opted to try antihistamine, nasal saline, Vit D and C, close follow up with PCP. Work slip provided. -follow up advised next week, sent message to schedulers to assist -of course, we advised [name]  to return or notify a doctor immediately if symptoms worsen or persist or new concerns arise.   I discussed the assessment and treatment plan with the patient. The patient was provided an opportunity to ask questions and all were answered. The patient agreed  with the plan and demonstrated an understanding of the instructions.   Lucretia Kern, DO   Patient Instructions  -nasal saline  -antihistamine  -Vit D3 1000 IU daily and can take Vitamin C as well  -follow up with your doctor early next week  I hope you are feeling better soon! Seek care promptly if your symptoms worsen, new concerns arise or you are not improving with treatment.      WORK SLIP:  Please excuse patient [name],  [birth year], from work today for her appointment. She will likely stay home if continues to feel poorly until follow up with her Doctor next week.  She/He may work remotely from home in self isolation if she is feeling better and wishes to do so.  Sincerely: E-signature: Dr. Colin Benton, DO [facility] Ph: [phone]

## 2018-09-14 NOTE — Telephone Encounter (Signed)
Patient completed Augmentin on 8/17 for sinus infection. Tested covid19 positive on 08/28/18. Started back to work on 8/17 but continues to have sinus pain and under the left eye tenderness and ears are still sore and feel full left worse than right. Headaches daily.sinus congestion unable to blow anything. fatigue continues. She has had to work from home after Monday due to feeling awful. No difficulty breathing/fever/cough. Encouraged increased water intake and NS nasal spray.  Using local pharmacy on file.  Please respond via MyChart if needing further treatment and a message for work stating she is being treated again.   Reason for Disposition . [1] Taking antibiotic > 72 hours (3 days) AND [2] sinus pain not improved  Answer Assessment - Initial Assessment Questions 1. LOCATION: "Where does it hurt?"      Around the nasal passages and under left eye. 2. ONSET: "When did the sinus pain start?"  (e.g., hours, days)      Has continued since recent visit for positive covid and sinus infection. 3. SEVERITY: "How bad is the pain?"   (Scale 1-10; mild, moderate or severe)   - MILD (1-3): doesn't interfere with normal activities    - MODERATE (4-7): interferes with normal activities (e.g., work or school) or awakens from sleep   - SEVERE (8-10): excruciating pain and patient unable to do any normal activities        Moderate.  4. RECURRENT SYMPTOM: "Have you ever had sinus problems before?" If so, ask: "When was the last time?" and "What happened that time?"      Recent 5. NASAL CONGESTION: "Is the nose blocked?" If so, ask, "Can you open it or must you breathe through the mouth?"     Yes, blocked. 6. NASAL DISCHARGE: "Do you have discharge from your nose?" If so ask, "What color?" nothing      7. FEVER: "Do you have a fever?" If so, ask: "What is it, how was it measured, and when did it start?"      denies 8. OTHER SYMPTOMS: "Do you have any other symptoms?" (e.g., sore throat, cough, earache,  difficulty breathing)     Clearing throat, ear soreness.9. PREGNANCY: "Is there any chance you are pregnant?" "When was your last menstrual period?"     no  Protocols used: SINUS INFECTION ON ANTIBIOTIC FOLLOW-UP CALL-A-AH, SINUS PAIN OR CONGESTION-A-AH

## 2018-09-14 NOTE — Patient Instructions (Signed)
-  nasal saline  -antihistamine  -Vit D3 1000 IU daily and can take Vitamin C as well  -follow up with your doctor early next week  I hope you are feeling better soon! Seek care promptly if your symptoms worsen, new concerns arise or you are not improving with treatment.      WORK SLIP:  Please excuse patient [name],  08-Jul-1970, from work today for her appointment. She will likely stay home if continues to feel poorly until follow up with her Doctor next week.  She/He may work remotely from home in self isolation if she is feeling better and wishes to do so.  Sincerely: E-signature: Dr. Colin Benton, DO [facility] Ph: [phone]

## 2018-09-14 NOTE — Telephone Encounter (Signed)
Pt has been scheduled virtual appt with Dr.Kim today at 3

## 2018-09-18 ENCOUNTER — Other Ambulatory Visit: Payer: Self-pay

## 2018-09-18 ENCOUNTER — Telehealth (INDEPENDENT_AMBULATORY_CARE_PROVIDER_SITE_OTHER): Payer: BLUE CROSS/BLUE SHIELD | Admitting: Internal Medicine

## 2018-09-18 ENCOUNTER — Encounter: Payer: Self-pay | Admitting: Internal Medicine

## 2018-09-18 DIAGNOSIS — U071 COVID-19: Secondary | ICD-10-CM

## 2018-09-18 DIAGNOSIS — R5383 Other fatigue: Secondary | ICD-10-CM

## 2018-09-18 NOTE — Progress Notes (Signed)
Virtual Visit via Video Note  I connected with@ on 09/18/18 at 10:15 AM EDT by a video enabled telemedicine application and verified that I am speaking with the correct person using two identifiers. Location patient: home Location provider:work  office Persons participating in the virtual visit: patient, provider  [place] national recommendations  regarding COVID 19 pandemic   video visit is advised over in office visit for this patient.  Patient aware  of the limitations of evaluation and management by telemedicine and  availability of in person appointments. and agreed to proceed.   HPI: [name] presents for video visit for follow-up video visit last week.  She is recovering from [place] which presented with low-grade fever a tickle cough and anosmia.  There is some question of secondary sinus infection last week was felt to not be bacterial and was advised to go on vitamin D and vitamin C supplements.  Since last week she is about 60% better has more energy although she still has head and chest congestion at times. She is continued to work but her boss is fortunately been flexible in her ability to do continuous hours. 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  She is working from home and the office. She has been laying down and resting in between tasks. No serious shortness of breath fever or chills she does have some sweats wonders if it is perimenopause.    ROS: See pertinent positives and negatives per HPI.  Past Medical History:  Diagnosis Date  . Allergic rhinitis    hx of testing and allergic to spring and fall f= pollens  . Anemia    nos  . Anxiety   . Chest pain    neg stress test summer 2010  . Endometriosis   . Endometriosis   . Fibroids   . GERD (gastroesophageal reflux disease)   . Goiter   . Headache(784.0)   . 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 Hyperlipidemia   . Hypertension   .  Irritable bowel syndrome   . Labial cyst 04/08/2011   early infection use hotpcompresses    . Migraines   . Ovarian torsion   . Sinusitis   . STD (sexually transmitted disease)    Pos. Chlamydia--in her 36's  . Thyroid disease    goiter    Past Surgical History:  Procedure Laterality Date  . CESAREAN SECTION  2002, 2004   x2  . IR ANGIOGRAM PELVIS SELECTIVE OR SUPRASELECTIVE  11/11/2017  . IR ANGIOGRAM PELVIS SELECTIVE OR SUPRASELECTIVE  11/11/2017  . IR ANGIOGRAM SELECTIVE EACH ADDITIONAL VESSEL  11/11/2017  . IR ANGIOGRAM SELECTIVE EACH ADDITIONAL VESSEL  11/11/2017  . IR EMBO TUMOR ORGAN ISCHEMIA INFARCT INC GUIDE ROADMAPPING  11/11/2017  . IR RADIOLOGIST EVAL & MGMT  10/11/2017  . IR RADIOLOGIST EVAL & MGMT  11/29/2017  . IR RADIOLOGIST EVAL & MGMT  08/15/2018  . IR US GUIDE VASC ACCESS RIGHT  11/11/2017  . PELVIC LAPAROSCOPY     x9  . RADIOLOGY WITH ANESTHESIA N/A 09/05/2014   Procedure: MRI OF BRAIN WITHOUT CONTRAST;  Surgeon: Medication Radiologist, MD;  Location: [facility];  Service: Radiology;  Laterality: N/A;  MRI/DR. JAFFE  . TUBAL LIGATION    . UNILATERAL SALPINGECTOMY Right 1995   ectopic pregnancy    Family History  Problem Relation Age of Onset  . Anxiety disorder Mother  mother is on xanax and apparently applying for  social security diabilty for her anxiety. t  . Depression Mother   . Diabetes Mother   . Hypertension Mother   . Heart attack Father   . Seizures Father   . Stroke Maternal Grandmother   . Diabetes Paternal Grandmother   . Hypertension Paternal Grandmother   . Coronary artery disease Unknown   . Diabetes Unknown   . Hyperlipidemia Unknown   . Other Unknown        history of child death in family   . Colon cancer Neg Hx   . Stomach cancer Neg Hx   . Esophageal cancer Neg Hx     Social History   Tobacco Use  . Smoking status: Never Smoker  . Smokeless tobacco: Never Used  Substance Use Topics  . Alcohol use: No    Alcohol/week:  0.0 standard drinks  . Drug use: No      Current Outpatient Medications:  .  atorvastatin (LIPITOR) 10 MG tablet, Take 1 tablet (10 mg total) by mouth daily., Disp: 90 tablet, Rfl: 2 .  baclofen (LIORESAL) 10 MG tablet, Take 10 mg by mouth 3 (three) times daily as needed for muscle spasms. , Disp: , Rfl: 0 .  citalopram (CELEXA) 20 MG tablet, Take 1 tablet (20 mg total) by mouth daily., Disp: 90 tablet, Rfl: 1 .  docusate sodium (COLACE) 100 MG capsule, Take 1 capsule (100 mg total) by mouth 2 (two) times daily., Disp: 10 capsule, Rfl: 0 .  fluticasone (FLONASE) 50 MCG/ACT nasal spray, Place 1 spray into both nostrils daily., Disp: 16 g, Rfl: 0 .  Galcanezumab-gnlm (EMGALITY [place]), Inject 120 mg into the skin every 30 (thirty) days. , Disp: , Rfl:  .  Multiple Vitamin (MULTIVITAMIN WITH MINERALS) TABS tablet, Take 1 tablet by mouth daily., Disp: , Rfl:  .  OnabotulinumtoxinA (BOTOX IJ), Inject as directed every 3 (three) months., Disp: , Rfl:  .  pantoprazole (PROTONIX) 40 MG tablet, TAKE 1 TABLET BY MOUTH EVERY DAY, Disp: 90 tablet, Rfl: 1 .  promethazine (PHENERGAN) 25 MG tablet, Take 1 tablet (25 mg total) by mouth every 6 (six) hours as needed for nausea or vomiting., Disp: 60 tablet, Rfl: 1 .  triamterene-hydrochlorothiazide (MAXZIDE-25) 37.5-25 MG tablet, TAKE 1 TABLET BY MOUTH DAILY, Disp: 90 tablet, Rfl: 2 .  venlafaxine XR (EFFEXOR-XR) 150 MG 24 hr capsule, TAKE 1 CAPSULE(150 MG) BY MOUTH DAILY WITH BREAKFAST (Patient taking differently: Take 150 mg by mouth daily with breakfast. ), Disp: 90 capsule, Rfl: 0 .  zonisamide (ZONEGRAN) 100 MG capsule, Take 100-300 mg by mouth 2 (two) times daily. , Disp: , Rfl: 1  EXAM: BP Readings from Last 3 Encounters:  11/29/17 (!) 142/89  11/12/17 (!) 142/82  11/12/17 116/84    VITALS per patient if applicable:  GENERAL: alert, oriented, appears well and in no acute distress  HEENT: atraumatic, conjunttiva clear, no obvious abnormalities on  inspection of external nose and ears she looks well no coughing during exam no shortness of breath and good color.  NECK: normal movements of the head and neck  LUNGS: on inspection no signs of respiratory distress, breathing rate appears normal, no obvious gross SOB, gasping or wheezing CV: no obvious cyanosis MS: moves all visible extremities without noticeable abnormality PSYCH/NEURO: pleasant and cooperative, no obvious depression or anxiety, speech and thought processing grossly intact Lab Results  Component Value Date   WBC 8.8 05/29/2018   HGB 12.7 05/29/2018  HCT 38.5 05/29/2018   PLT 401.0 (H) 05/29/2018   GLUCOSE 99 05/29/2018   CHOL 253 (H) 05/29/2018   TRIG 135.0 05/29/2018   HDL 44.00 05/29/2018   LDLDIRECT 167.0 06/07/2017   LDLCALC 182 (H) 05/29/2018   ALT 12 05/29/2018   AST 14 05/29/2018   NA 139 05/29/2018   K 3.7 05/29/2018   CL 104 05/29/2018   CREATININE 0.98 05/29/2018   BUN 12 05/29/2018   CO2 25 05/29/2018   TSH 3.02 05/29/2018   INR 1.01 11/11/2017   HGBA1C 6.1 05/29/2018    ASSESSMENT AND PLAN:  Discussed the following assessment and plan:    ICD-10-CM   1. COVID-19 virus infection  U07.1   2. Other fatigue  R53.83    realted to covid infection see  texts   Convalescent from COVID 19 SARS-CoV-2.  Expectant management not surprised that she would have continuing fatigue when trying to do full-time. 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  To continue for 2 weeks. If further accommodations are needed at that time she can have a follow-up visit.  I expect her to slowly regain her energy level and she can stay on the vitamin D and see.  To get back with [place] with alarm symptoms. Counseled.  Letter written sent by my chart   Expectant management and discussion of plan and treatment with opportunity to ask questions and all were answered. The patient agreed with the plan and demonstrated an understanding of the  instructions.   Advised to call back or seek an in-person evaluation if worsening  or having  further concerns .   [name], MD

## 2018-09-24 ENCOUNTER — Telehealth: Payer: Self-pay | Admitting: *Deleted

## 2018-09-24 NOTE — Telephone Encounter (Signed)
Patient called after hours line on 09/23/2018. Patient complains she is having trouble with her righteye. Patient reports she is having lots of nasal congestion and congestion in her face left from having covid19 earlier in the month. Caller states her eye is very painful, pinkish color, and some discharge. Please advise

## 2018-09-25 NOTE — Telephone Encounter (Signed)
Spoke to pt and she stated that the issues cleared up. Pt declined a visit.

## 2018-09-25 NOTE — Telephone Encounter (Signed)
Virtual viist  To be able to decide on rx

## 2018-10-17 ENCOUNTER — Encounter: Payer: Self-pay | Admitting: Gynecology

## 2018-10-23 ENCOUNTER — Ambulatory Visit: Payer: BLUE CROSS/BLUE SHIELD | Admitting: Obstetrics and Gynecology

## 2018-11-07 ENCOUNTER — Encounter: Payer: Self-pay | Admitting: Obstetrics and Gynecology

## 2018-11-07 ENCOUNTER — Other Ambulatory Visit: Payer: Self-pay

## 2018-11-07 ENCOUNTER — Encounter: Payer: Self-pay | Admitting: Family Medicine

## 2018-11-07 ENCOUNTER — Telehealth (INDEPENDENT_AMBULATORY_CARE_PROVIDER_SITE_OTHER): Payer: Medicaid Other | Admitting: Family Medicine

## 2018-11-07 ENCOUNTER — Telehealth: Payer: Self-pay | Admitting: Obstetrics and Gynecology

## 2018-11-07 VITALS — Temp 99.6°F

## 2018-11-07 DIAGNOSIS — N898 Other specified noninflammatory disorders of vagina: Secondary | ICD-10-CM

## 2018-11-07 DIAGNOSIS — J0100 Acute maxillary sinusitis, unspecified: Secondary | ICD-10-CM

## 2018-11-07 DIAGNOSIS — H9202 Otalgia, left ear: Secondary | ICD-10-CM

## 2018-11-07 MED ORDER — AMOXICILLIN-POT CLAVULANATE 875-125 MG PO TABS: 1.0000 | ORAL_TABLET | Freq: Two times a day (BID) | ORAL | 0 refills | Status: DC

## 2018-11-07 MED ORDER — FLUCONAZOLE 150 MG PO TABS: 150.0000 mg | ORAL_TABLET | Freq: Once | ORAL | 1 refills | Status: AC

## 2018-11-07 NOTE — Patient Instructions (Signed)
-  I sent the medication(s) we discussed to your pharmacy:  Meds ordered this encounter  Medications  . amoxicillin-clavulanate (AUGMENTIN) 875-125 MG tablet    Sig: Take 1 tablet by mouth 2 (two) times daily.    Dispense:  20 tablet    Refill:  0  . fluconazole (DIFLUCAN) 150 MG tablet    Sig: Take 1 tablet (150 mg total) by mouth once for 1 dose.    Dispense:  1 tablet    Refill:  1    Please let us know if you have any questions or concerns regarding this prescription.  I hope you are feeling better soon! Seek care promptly if your symptoms worsen, new concerns arise or you are not improving and resolving with treatment.  Please contact your job about work restrictions given the respiratory symptoms. We are not able to 100% rule out COVID19, though feel is not the most likely diagnosis.

## 2018-11-07 NOTE — Telephone Encounter (Signed)
Patient sent the following message through [facility]. Routing to triage to assist patient with request.  [place], [facility]  Phone Number: [phone]        Dr.Silva  I have been treating what I thought was a yeast infection for over a week with no improvement. I have some middle lower abdominal pain and left flank pain. No UTI symptoms really. But start running a low grade fever yesterday. Please let me know what you like me to do next thanks [facility]

## 2018-11-07 NOTE — Telephone Encounter (Signed)
Spoke with patient. Patient reports vaginal itching that started 10 days ago. Treated with OTC Monistat 3 day x2, no resolve in symptoms. Yellow vaginal d/c, urinary incontinence and frequency, left flank pain, nausea, temp 99.6. Was treated by PCP with prednisone for migraine prior to symptoms starting. Positive for Covid 08/2018, still has cough, loss of taste and smell and ear pain not full resolved. Mid lower abdominal pain 2/10.   Advised patient she will need to f/u with PCP for further evaluation. Advised I will update Dr. Quincy Simmonds, our office will return call if any additional recommendations. Patient verbalizes understanding.  Routing to provider for final review. Patient is agreeable to disposition. Will close encounter.

## 2018-11-07 NOTE — Progress Notes (Signed)
Virtual Visit via Video Note  I connected with [name]  on 11/07/18 at 10:40 AM EDT by a video enabled telemedicine application and verified that I am speaking with the correct person using two identifiers.  Location patient: home Location provider:work or home office Persons participating in the virtual visit: patient, provider  I discussed the limitations of evaluation and management by telemedicine and the availability of in person appointments. The patient expressed understanding and agreed to proceed.   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  Past Medical History:  Diagnosis Date  . Allergic rhinitis    hx of testing and allergic to spring and fall f= pollens  . Anemia    nos  . Anxiety   . Chest pain    neg stress test summer 2010  . Endometriosis   . Endometriosis   . Fibroids   . GERD (gastroesophageal reflux disease)   . Goiter   . Headache(784.0)   . 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 Hyperlipidemia   . Hypertension   . Irritable bowel  syndrome   . Labial cyst 04/08/2011   early infection use hotpcompresses    . Migraines   . Ovarian torsion   . Sinusitis   . STD (sexually transmitted disease)    Pos. Chlamydia--in her 24's  . Thyroid disease    goiter    Past Surgical History:  Procedure Laterality Date  . CESAREAN SECTION  2002, 2004   x2  . IR ANGIOGRAM PELVIS SELECTIVE OR SUPRASELECTIVE  11/11/2017  . IR ANGIOGRAM PELVIS SELECTIVE OR SUPRASELECTIVE  11/11/2017  . IR ANGIOGRAM SELECTIVE EACH ADDITIONAL VESSEL  11/11/2017  . IR ANGIOGRAM SELECTIVE EACH ADDITIONAL VESSEL  11/11/2017  . IR EMBO TUMOR ORGAN ISCHEMIA INFARCT INC GUIDE ROADMAPPING  11/11/2017  . IR RADIOLOGIST EVAL & MGMT  10/11/2017  . IR RADIOLOGIST EVAL & MGMT  11/29/2017  . IR RADIOLOGIST EVAL & MGMT  08/15/2018  . IR US GUIDE VASC ACCESS RIGHT  11/11/2017  . PELVIC LAPAROSCOPY     x9  . RADIOLOGY WITH ANESTHESIA N/A 09/05/2014   Procedure: MRI OF BRAIN WITHOUT CONTRAST;  Surgeon: Medication Radiologist, MD;  Location: [facility];  Service: Radiology;  Laterality: N/A;  MRI/DR. JAFFE  . TUBAL LIGATION    . UNILATERAL SALPINGECTOMY Right 1995   ectopic pregnancy    Family History  Problem Relation Age of Onset  . Anxiety disorder Mother        mother is on xanax  and apparently applying for  social security diabilty for her anxiety. t  . Depression Mother   . Diabetes Mother   . Hypertension Mother   . Heart attack Father   . Seizures Father   . Stroke Maternal Grandmother   . Diabetes Paternal Grandmother   . Hypertension Paternal Grandmother   . Coronary artery disease Unknown   . Diabetes Unknown   . Hyperlipidemia Unknown   . Other Unknown        history of child death in family   . Colon cancer Neg Hx   . Stomach cancer Neg Hx   . Esophageal cancer Neg Hx     SOCIAL HX: see hpi   Current Outpatient Medications:  .  atorvastatin (LIPITOR) 10 MG tablet, Take 1 tablet (10 mg total) by mouth daily., Disp: 90 tablet, Rfl: 2 .   baclofen (LIORESAL) 10 MG tablet, Take 10 mg by mouth 3 (three) times daily as needed for muscle spasms. , Disp: , Rfl: 0 .  citalopram (CELEXA) 20 MG tablet, Take 1 tablet (20 mg total) by mouth daily., Disp: 90 tablet, Rfl: 1 .  Galcanezumab-gnlm (EMGALITY [place]), Inject 120 mg into the skin every 30 (thirty) days. , Disp: , Rfl:  .  Multiple Vitamin (MULTIVITAMIN WITH MINERALS) TABS tablet, Take 1 tablet by mouth daily., Disp: , Rfl:  .  OnabotulinumtoxinA (BOTOX IJ), Inject as directed every 3 (three) months., Disp: , Rfl:  .  pantoprazole (PROTONIX) 40 MG tablet, TAKE 1 TABLET BY MOUTH EVERY DAY, Disp: 90 tablet, Rfl: 1 .  promethazine (PHENERGAN) 25 MG tablet, Take 1 tablet (25 mg total) by mouth every 6 (six) hours as needed for nausea or vomiting., Disp: 60 tablet, Rfl: 1 .  triamterene-hydrochlorothiazide (MAXZIDE-25) 37.5-25 MG tablet, TAKE 1 TABLET BY MOUTH DAILY, Disp: 90 tablet, Rfl: 2 .  vitamin C (ASCORBIC ACID) 500 MG tablet, Take 500 mg by mouth daily., Disp: , Rfl:  .  VITAMIN D PO, Take 2,000 Units by mouth daily., Disp: , Rfl:  .  zonisamide (ZONEGRAN) 100 MG capsule, Take 100-300 mg by mouth 2 (two) times daily. , Disp: , Rfl: 1 .  amoxicillin-clavulanate (AUGMENTIN) 875-125 MG tablet, Take 1 tablet by mouth 2 (two) times daily., Disp: 20 tablet, Rfl: 0 .  fluconazole (DIFLUCAN) 150 MG tablet, Take 1 tablet (150 mg total) by mouth once for 1 dose., Disp: 1 tablet, Rfl: 1  EXAM:  VITALS per patient if applicable:denies fever > 100 - see above, temp 99.6 last night  GENERAL: alert, oriented, appears well and in no acute distress  HEENT: atraumatic, conjunttiva clear, no obvious abnormalities on inspection of external nose and ears, L max TTP per pt  NECK: normal movements of the head and neck  LUNGS: on inspection no signs of respiratory distress, breathing rate appears normal, no obvious gross SOB, gasping or wheezing  CV: no obvious cyanosis  MS: moves all visible  extremities without noticeable abnormality  PSYCH/NEURO: pleasant and cooperative, no obvious depression or anxiety, speech and thought processing grossly intact  ASSESSMENT AND PLAN:  Discussed the following assessment and plan:  Acute maxillary sinusitis, recurrence not specified  Left ear pain  Vaginal discharge  -we discussed possible serious and likely etiologies, options for evaluation and workup, limitations of telemedicine visit vs in person visit, treatment, treatment risks and precautions. ADvised for the URI could be acute viral illness ontop of chronic allergies, vs sinusitis/possible OM given duration and worsening vs other. Repeat covid  testing not recommended given she was positive in last 90 days, though advised we are not 100% sure of wether or not people can get it again in the few months following a COVID19 illness. Advised she contact her work about her symptoms and stay home 10 days or at least until feeling all better for 3 days.   For the vaginal symptoms advised inperson exam would be more helpful at [facility]. Pt prefers to treat via telemedicine empirically rather then risking or undertaking an in person visit at this moment. Opted for trial tx with Diflucan. Patient agrees to seek prompt in person care if worsening, new symptoms arise, or if is not improving with treatment.   I discussed the assessment and treatment plan with the patient. The patient was provided an opportunity to ask questions and all were answered. The patient agreed with the plan and demonstrated an understanding of the instructions.   The patient was advised to call back or seek an in-person evaluation if the symptoms worsen or if the condition fails to improve as anticipated.   Lucretia Kern, DO   Patient Instructions   -I sent the medication(s) we discussed to your pharmacy:  Meds ordered this encounter  Medications  . amoxicillin-clavulanate (AUGMENTIN) 875-125 MG tablet    Sig: Take 1 tablet  by mouth 2 (two) times daily.    Dispense:  20 tablet    Refill:  0  . fluconazole (DIFLUCAN) 150 MG tablet    Sig: Take 1 tablet (150 mg total) by mouth once for 1 dose.    Dispense:  1 tablet    Refill:  1    Please let us know if you have any questions or concerns regarding this prescription.  I hope you are feeling better soon! Seek care promptly if your symptoms worsen, new concerns arise or you are not improving and resolving with treatment.  Please contact your job about work restrictions given the respiratory symptoms. We are not able to 100% rule out COVID19, though feel is not the most likely diagnosis.

## 2018-11-13 ENCOUNTER — Emergency Department (HOSPITAL_BASED_OUTPATIENT_CLINIC_OR_DEPARTMENT_OTHER)
Admission: EM | Admit: 2018-11-13 | Discharge: 2018-11-14 | Disposition: A | Discharge status: Home / Self Care | Payer: BLUE CROSS/BLUE SHIELD | Attending: Emergency Medicine | Admitting: Emergency Medicine

## 2018-11-13 ENCOUNTER — Other Ambulatory Visit: Payer: Self-pay

## 2018-11-13 ENCOUNTER — Encounter (HOSPITAL_BASED_OUTPATIENT_CLINIC_OR_DEPARTMENT_OTHER): Payer: Self-pay | Admitting: *Deleted

## 2018-11-13 DIAGNOSIS — I10 Essential (primary) hypertension: Secondary | ICD-10-CM | POA: Diagnosis not present

## 2018-11-13 DIAGNOSIS — A599 Trichomoniasis, unspecified: Secondary | ICD-10-CM | POA: Insufficient documentation

## 2018-11-13 DIAGNOSIS — Z79899 Other long term (current) drug therapy: Secondary | ICD-10-CM | POA: Insufficient documentation

## 2018-11-13 DIAGNOSIS — J3489 Other specified disorders of nose and nasal sinuses: Secondary | ICD-10-CM | POA: Diagnosis present

## 2018-11-13 DIAGNOSIS — J329 Chronic sinusitis, unspecified: Secondary | ICD-10-CM | POA: Diagnosis not present

## 2018-11-13 NOTE — ED Triage Notes (Signed)
Facial pain, chills, cough, body aches, fever for a week. She had Covid in August. She was started on an antibiotic a week ago.

## 2018-11-14 ENCOUNTER — Emergency Department (HOSPITAL_BASED_OUTPATIENT_CLINIC_OR_DEPARTMENT_OTHER): Payer: BLUE CROSS/BLUE SHIELD

## 2018-11-14 LAB — URINALYSIS, MICROSCOPIC (REFLEX): WBC, UA: >50 WBC/hpf (ref 0–5)

## 2018-11-14 LAB — URINALYSIS, ROUTINE W REFLEX MICROSCOPIC
Bilirubin Urine: NEGATIVE
Glucose, UA: NEGATIVE mg/dL
Ketones, ur: NEGATIVE mg/dL
Nitrite: NEGATIVE
Protein, ur: NEGATIVE mg/dL
Specific Gravity, Urine: <1.005 — ABNORMAL LOW (ref 1.005–1.030)
pH: 7 (ref 5.0–8.0)

## 2018-11-14 LAB — WET PREP, GENITAL
Clue Cells Wet Prep HPF POC: NONE SEEN
Sperm: NONE SEEN
Yeast Wet Prep HPF POC: NONE SEEN

## 2018-11-14 LAB — HIV ANTIBODY (ROUTINE TESTING W REFLEX): HIV Screen 4th Generation wRfx: NONREACTIVE

## 2018-11-14 LAB — RPR: RPR Ser Ql: NONREACTIVE

## 2018-11-14 LAB — INFLUENZA PANEL BY PCR (TYPE A & B)
Influenza A By PCR: NEGATIVE
Influenza B By PCR: NEGATIVE

## 2018-11-14 LAB — HCG, QUANTITATIVE, PREGNANCY: hCG, Beta Chain, Quant, S: 1 m[IU]/mL (ref <5)

## 2018-11-14 MED ORDER — LIDOCAINE HCL (PF) 1 % IJ SOLN
INTRAMUSCULAR | Status: AC
  Filled 2018-11-14: qty 5

## 2018-11-14 MED ORDER — CEFTRIAXONE SODIUM 250 MG IJ SOLR
250.0000 mg | Freq: Once | INTRAMUSCULAR | Status: AC
  Administered 2018-11-14: 250 mg via INTRAMUSCULAR
  Filled 2018-11-14: qty 250

## 2018-11-14 MED ORDER — METRONIDAZOLE 500 MG PO TABS: 500.0000 mg | ORAL_TABLET | Freq: Two times a day (BID) | ORAL | 0 refills | Status: AC

## 2018-11-14 MED ORDER — METRONIDAZOLE 500 MG PO TABS
500.0000 mg | ORAL_TABLET | Freq: Once | ORAL | Status: AC
  Administered 2018-11-14: 500 mg via ORAL
  Filled 2018-11-14: qty 1

## 2018-11-14 MED ORDER — DOXYCYCLINE HYCLATE 100 MG PO TABS
100.0000 mg | ORAL_TABLET | Freq: Once | ORAL | Status: AC
  Administered 2018-11-14: 100 mg via ORAL
  Filled 2018-11-14: qty 1

## 2018-11-14 MED ORDER — DOXYCYCLINE HYCLATE 100 MG PO CAPS: 100.0000 mg | ORAL_CAPSULE | Freq: Two times a day (BID) | ORAL | 0 refills | Status: AC

## 2018-11-14 NOTE — ED Provider Notes (Signed)
Emergency Department Provider Note   I have reviewed the triage vital signs and the nursing notes.   HISTORY  Chief Complaint Cough and Generalized Body Aches   HPI [name] is a 48 y.o. female who presents to the emergency department today with multiple complaints.  Patient initial complaint is that she has sinus pressure similar to previous episodes of sinusitis that she has been Augmentin for approximately in the 5 days.  Initially was getting better but now is getting bad again. Secondarily patient has lower abdominal pain and flank pain being treated for UTI with Augmentin.  She states that this initially got better as a flank pain way but the lower belly pain did not improve.  She also has vaginal discharge that they thought was a yeast infection but fluconazole and Monistat do not seem to be taking care of it.  She is sexually active with 1 person and intermittently use protection.  No pain with intercourse.  No pain with ambulation.   No other associated or modifying symptoms.    Past Medical History:  Diagnosis Date  . Allergic rhinitis    hx of testing and allergic to spring and fall f= pollens  . Anemia    nos  . Anxiety   . Chest pain    neg stress test summer 2010  . Endometriosis   . Endometriosis   . Fibroids   . GERD (gastroesophageal reflux disease)   . Goiter   . Headache(784.0)   . 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 Hyperlipidemia   . Hypertension   . Irritable bowel syndrome   . Labial cyst 04/08/2011   early infection use hotpcompresses    . Migraines   . Ovarian torsion   . Sinusitis   . STD (sexually transmitted disease)    Pos. Chlamydia--in her 8's  . Thyroid disease    goiter    Patient Active Problem List   Diagnosis Date Noted  . Uterine fibroid 11/11/2017  . Fibroids 11/11/2017  . Depression 06/07/2017  . Bilateral carpal tunnel syndrome 01/20/2017  . Left sided numbness  01/20/2017  . Morbid obesity ([place]) 01/30/2015  . 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 RUQ pain 02/25/2014  . Diarrhea 02/25/2014  . Calcium oxalate crystals in urine 02/22/2014  . Intramural leiomyoma of uterus 02/22/2014  . Hx of migraine headaches 02/04/2014  . Medication management 02/04/2014  . Hx of endometriosis 02/04/2014  . Right lower quadrant abdominal pain mid also 02/04/2014  . Adjustment disorder with anxious mood 01/17/2014  . Thyroid mass 02/16/2013  . Fever, unspecified 02/15/2013  . Sinusitis 02/15/2013  . Anxiety 12/11/2011  . Stress 12/11/2011  . Irregular periods 08/17/2011  . Endometriosis 08/17/2011  . Edema 05/10/2011  . Elevated blood pressure reading 05/10/2011  . Fatigue 04/08/2011  . Upper respiratory tract infection 04/08/2011  . SINUSITIS, RECURRENT 11/06/2009  . FATIGUE 02/25/2009  . PALPITATIONS 02/25/2009  . DYSPNEA 02/25/2009  . KNEE INJURY, RIGHT 12/05/2007  . HEADACHE 05/03/2007  . ADULT SITUATIONAL REACTION 11/21/2006  . SLEEPLESSNESS 11/21/2006  . CHEST PAIN 11/21/2006  . TINEA PEDIS 08/31/2006  . Other and unspecified hyperlipidemia 08/31/2006  . DISORDER, ADJUSTMENT W/ANXIETY 08/31/2006  . PLANTAR FASCIITIS 08/31/2006  . ANEMIA-NOS 06/23/2006  . MIGRAINE, COMMON 06/23/2006  . ALLERGIC RHINITIS 06/23/2006  . GERD 06/23/2006  . IRRITABLE BOWEL SYNDROME 06/23/2006    Past Surgical History:  Procedure  Laterality Date  . CESAREAN SECTION  2002, 2004   x2  . IR ANGIOGRAM PELVIS SELECTIVE OR SUPRASELECTIVE  11/11/2017  . IR ANGIOGRAM PELVIS SELECTIVE OR SUPRASELECTIVE  11/11/2017  . IR ANGIOGRAM SELECTIVE EACH ADDITIONAL VESSEL  11/11/2017  . IR ANGIOGRAM SELECTIVE EACH ADDITIONAL VESSEL  11/11/2017  . IR EMBO TUMOR ORGAN ISCHEMIA INFARCT INC GUIDE ROADMAPPING  11/11/2017  . IR RADIOLOGIST EVAL & MGMT  10/11/2017  . IR RADIOLOGIST EVAL & MGMT  11/29/2017  . IR RADIOLOGIST EVAL & MGMT  08/15/2018  .  IR US GUIDE VASC ACCESS RIGHT  11/11/2017  . PELVIC LAPAROSCOPY     x9  . RADIOLOGY WITH ANESTHESIA N/A 09/05/2014   Procedure: MRI OF BRAIN WITHOUT CONTRAST;  Surgeon: Medication Radiologist, MD;  Location: [place];  Service: Radiology;  Laterality: N/A;  MRI/DR. JAFFE  . TUBAL LIGATION    . UNILATERAL SALPINGECTOMY Right 1995   ectopic pregnancy    Current Outpatient Rx  . Order #: [id number] Class: Normal  . Order #: [id number] Class: Normal  . Order #: [id number] Class: Historical Med  . Order #: [id number] Class: Normal  . Order #: [id number] Class: Normal  . Order #: [id number] Class: Historical Med  . Order #: [id number] Class: Normal  . Order #: [id number] Class: Historical Med  . Order #: [id number] Class: Historical Med  . Order #: [id number] Class: Normal  . Order #: [id number] Class: Normal  . Order #: [id number] Class: Normal  . Order #: [id number] Class: Historical Med  . Order #: [id number] Class: Historical Med  . Order #: [id number] Class: Historical Med    Allergies Clonidine derivatives, Codeine phosphate, Moxifloxacin, Sulfamethoxazole, and Sumatriptan succinate  Family History  Problem Relation Age of Onset  . Anxiety disorder Mother        mother is on xanax and apparently applying for  social security diabilty for her anxiety. t  . Depression Mother   . Diabetes Mother   . Hypertension Mother   . Heart attack Father   . Seizures Father   . Stroke Maternal Grandmother   . Diabetes Paternal Grandmother   . Hypertension Paternal Grandmother   . Coronary artery disease Other   . Diabetes Other   . Hyperlipidemia Other   . Other Other        history of child death in family   . Colon cancer Neg Hx   . Stomach cancer Neg Hx   . Esophageal cancer Neg Hx     Social History Social History   Tobacco Use  . Smoking status: Never Smoker  . Smokeless tobacco: Never Used  Substance Use Topics  . Alcohol use: No    Alcohol/week: 0.0 standard drinks  . Drug use: No    Review of  Systems  All other systems negative except as documented in the HPI. All pertinent positives and negatives as reviewed in the HPI. ____________________________________________   PHYSICAL EXAM:  VITAL SIGNS: ED Triage Vitals  Enc Vitals Group     BP 11/13/18 2259 (!) 147/94     Pulse Rate 11/13/18 2259 (!) 109     Resp 11/13/18 2259 16     Temp 11/13/18 2259 99.7 F (37.6 C)     Temp Source 11/13/18 2259 Oral     SpO2 11/13/18 2259 99 %     Weight 11/13/18 2257 230 lb (104.3 kg)     Height 11/13/18 2257 5' 5.5" (1.664 m)    Constitutional: Alert and oriented. Well appearing and in no acute distress. Eyes: Conjunctivae are normal. PERRL. EOMI. Head:  Atraumatic. Nose: No congestion/rhinnorhea. Mouth/Throat: Mucous membranes are moist.  Oropharynx non-erythematous. Neck: No stridor.  No meningeal signs.   Cardiovascular: Normal rate, regular rhythm. Good peripheral circulation. Grossly normal heart sounds.   Respiratory: Normal respiratory effort.  No retractions. Lungs CTAB. Gastrointestinal: Soft and nontender. No distention.  Musculoskeletal: No lower extremity tenderness nor edema. No gross deformities of extremities. Neurologic:  Normal speech and language. No gross focal neurologic deficits are appreciated.  Skin:  Skin is warm, dry and intact. No rash noted.   ____________________________________________   LABS (all labs ordered are listed, but only abnormal results are displayed)  Labs Reviewed  WET PREP, GENITAL - Abnormal; Notable for the following components:      Result Value   Trich, Wet Prep PRESENT (*)    WBC, Wet Prep HPF POC MANY (*)    All other components within normal limits  URINALYSIS, ROUTINE W REFLEX MICROSCOPIC - Abnormal; Notable for the following components:   APPearance CLOUDY (*)    Specific Gravity, Urine <1.005 (*)    Hgb urine dipstick SMALL (*)    Leukocytes,Ua LARGE (*)    All other components within normal limits  URINALYSIS,  MICROSCOPIC (REFLEX) - Abnormal; Notable for the following components:   Bacteria, UA MANY (*)    Trichomonas, UA PRESENT (*)    All other components within normal limits  HCG, QUANTITATIVE, PREGNANCY  INFLUENZA PANEL BY PCR (TYPE A & B)  RPR  HIV ANTIBODY (ROUTINE TESTING W REFLEX)  GC/CHLAMYDIA PROBE AMP ([place]) NOT AT [facility]   ____________________________________________  RADIOLOGY  Dg Chest Portable 1 View  Result Date: 11/14/2018 CLINICAL DATA:  Cough, fever chills EXAM: PORTABLE CHEST 1 VIEW COMPARISON:  August 27, 2013 FINDINGS: The heart size and mediastinal contours are within normal limits. Both lungs are clear. The visualized skeletal structures are unremarkable. IMPRESSION: No acute cardiopulmonary process. Electronically Signed   By: Prudencio Pair M.D.   On: 11/14/2018 01:54    ____________________________________________   PROCEDURES  Procedure(s) performed:   Pelvic exam  Date/Time: 11/14/2018 6:23 AM Performed by: Merrily Pew, MD Authorized by: Merrily Pew, MD  Consent: Verbal consent obtained. Risks and benefits: risks, benefits and alternatives were discussed Consent given by: patient Patient understanding: patient states understanding of the procedure being performed Patient consent: the patient's understanding of the procedure matches consent given Relevant documents: relevant documents present and verified Test results: test results available and properly labeled Required items: required blood products, implants, devices, and special equipment available Patient identity confirmed: verbally with patient Time out: Immediately prior to procedure a "time out" was called to verify the correct patient, procedure, equipment, support staff and site/side marked as required. Preparation: Patient was prepped and draped in the usual sterile fashion. Local anesthesia used: no  Anesthesia: Local anesthesia used: no  Sedation: Patient sedated: no   Patient tolerance: patient tolerated the procedure well with no immediate complications Comments: Chaperoned by: nurse Lysle Morales cerivicitis and purulent discharge from os   ____________________________________________   INITIAL IMPRESSION / ASSESSMENT AND PLAN / ED COURSE  Signs symptoms consistent with pelvic inflammatory disease.  Will treat for the same.  This should also cover her worsening sinusitis.  No evidence for systemic illness require inpatient hospitalization.  Will follow up with a gynecologist.  Will abstain from sex until she is rechecked for clearance and will discuss with her partner for doing the same.  Pertinent labs & imaging results that were available during my care of the  patient were reviewed by me and considered in my medical decision making (see chart for details).  A medical screening exam was performed and I feel the patient has had an appropriate workup for their chief complaint at this time and likelihood of emergent condition existing is low. They have been counseled on decision, discharge, follow up and which symptoms necessitate immediate return to the emergency department. They or their family verbally stated understanding and agreement with plan and discharged in stable condition.   ____________________________________________  FINAL CLINICAL IMPRESSION(S) / ED DIAGNOSES  Final diagnoses:  Trichomoniasis  Sinusitis, unspecified chronicity, unspecified location     MEDICATIONS GIVEN DURING THIS VISIT:  Medications  lidocaine (PF) (XYLOCAINE) 1 % injection (has no administration in time range)  cefTRIAXone (ROCEPHIN) injection 250 mg (250 mg Intramuscular Given 11/14/18 0313)  doxycycline (VIBRA-TABS) tablet 100 mg (100 mg Oral Given 11/14/18 0312)  metroNIDAZOLE (FLAGYL) tablet 500 mg (500 mg Oral Given 11/14/18 0313)     NEW OUTPATIENT MEDICATIONS STARTED DURING THIS VISIT:  Discharge Medication List as of 11/14/2018  3:00 AM    START  taking these medications   Details  doxycycline (VIBRAMYCIN) 100 MG capsule Take 1 capsule (100 mg total) by mouth 2 (two) times daily for 14 days., Starting Tue 11/14/2018, Until Tue 11/28/2018, Normal    metroNIDAZOLE (FLAGYL) 500 MG tablet Take 1 tablet (500 mg total) by mouth 2 (two) times daily for 14 days., Starting Tue 11/14/2018, Until Tue 11/28/2018, Normal        Note:  This note was prepared with assistance of Dragon voice recognition software. Occasional wrong-word or sound-a-like substitutions may have occurred due to the inherent limitations of voice recognition software.   Kael Keetch, [name], MD 11/14/18 [phone]

## 2018-11-15 LAB — GC/CHLAMYDIA PROBE AMP (~~LOC~~) NOT AT ARMC
Chlamydia: NEGATIVE
Neisseria Gonorrhea: NEGATIVE

## 2018-12-05 ENCOUNTER — Encounter: Payer: Self-pay | Admitting: Internal Medicine

## 2018-12-05 ENCOUNTER — Other Ambulatory Visit: Payer: Self-pay

## 2018-12-05 ENCOUNTER — Telehealth (INDEPENDENT_AMBULATORY_CARE_PROVIDER_SITE_OTHER): Payer: BLUE CROSS/BLUE SHIELD | Admitting: Internal Medicine

## 2018-12-05 DIAGNOSIS — G5600 Carpal tunnel syndrome, unspecified upper limb: Secondary | ICD-10-CM | POA: Diagnosis not present

## 2018-12-05 DIAGNOSIS — R29818 Other symptoms and signs involving the nervous system: Secondary | ICD-10-CM | POA: Diagnosis not present

## 2018-12-05 DIAGNOSIS — M5489 Other dorsalgia: Secondary | ICD-10-CM

## 2018-12-05 NOTE — Progress Notes (Signed)
Virtual Visit via Video Note  I connected with@ on 12/05/18 at  1:30 PM EST by a video enabled telemedicine application and verified that I am speaking with the correct person using two identifiers. Location patient: in her card driving  Location provider:work  office Persons participating in the virtual visit: patient, provider  [place] national recommendations  regarding COVID 19 pandemic   video visit is advised over in office visit for this patient.  Patient aware  of the limitations of evaluation and management by telemedicine and  availability of in person appointments. and agreed to proceed.   HPI: [name] presents for video visit for a number of sx. 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    Thinks cts may be increasing also   But no specific weakness   Over past 18 mos gets " Zaps" like electric shocks through body  Doesn't correlated with meds   She is on Botox and zonisamide  and emaglity as needed and migraines controlled Dr Joretta Bachelor doesn't address other neuro issues .   Had covid in august 2020 (not hospitalized ) But better    ROS: See pertinent positives and negatives per HPI. Numb left toes? For a while  No  weakness  h xcoplex migraine  Past Medical History:  Diagnosis Date  . Allergic rhinitis    hx of testing and allergic to spring and fall f= pollens  . Anemia    nos  . Anxiety   . Chest pain    neg stress test summer 2010  . Endometriosis   . Endometriosis   . Fibroids   . GERD (gastroesophageal reflux disease)   . Goiter   . Headache(784.0)   . 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 Hyperlipidemia   . Hypertension   .  Irritable bowel syndrome   . Labial cyst 04/08/2011   early infection use hotpcompresses    . Migraines   . Ovarian torsion   . Sinusitis   . STD (sexually transmitted disease)    Pos. Chlamydia--in her 47's  . Thyroid disease    goiter    Past Surgical History:  Procedure Laterality Date  . CESAREAN SECTION  2002, 2004   x2  . IR ANGIOGRAM PELVIS SELECTIVE OR SUPRASELECTIVE  11/11/2017  . IR ANGIOGRAM PELVIS SELECTIVE OR SUPRASELECTIVE  11/11/2017  . IR ANGIOGRAM SELECTIVE EACH ADDITIONAL VESSEL  11/11/2017  . IR ANGIOGRAM SELECTIVE EACH ADDITIONAL VESSEL  11/11/2017  . IR EMBO TUMOR ORGAN ISCHEMIA INFARCT INC GUIDE ROADMAPPING  11/11/2017  . IR RADIOLOGIST EVAL & MGMT  10/11/2017  . IR RADIOLOGIST EVAL & MGMT  11/29/2017  . IR RADIOLOGIST EVAL & MGMT  08/15/2018  . IR US GUIDE VASC ACCESS RIGHT  11/11/2017  . PELVIC LAPAROSCOPY     x9  . RADIOLOGY WITH ANESTHESIA N/A 09/05/2014   Procedure: MRI OF BRAIN WITHOUT CONTRAST;  Surgeon: Medication Radiologist, MD;  Location: [facility];  Service: Radiology;  Laterality: N/A;  MRI/DR. JAFFE  . TUBAL LIGATION    .  UNILATERAL SALPINGECTOMY Right 1995   ectopic pregnancy    Family History  Problem Relation Age of Onset  . Anxiety disorder Mother        mother is on xanax and apparently applying for  social security diabilty for her anxiety. t  . Depression Mother   . Diabetes Mother   . Hypertension Mother   . Heart attack Father   . Seizures Father   . Stroke Maternal Grandmother   . Diabetes Paternal Grandmother   . Hypertension Paternal Grandmother   . Coronary artery disease Other   . Diabetes Other   . Hyperlipidemia Other   . Other Other        history of child death in family   . Colon cancer Neg Hx   . Stomach cancer Neg Hx   . Esophageal cancer Neg Hx     Social History   Tobacco Use  . Smoking status: Never Smoker  . Smokeless tobacco: Never Used  Substance Use Topics  . Alcohol use: No    Alcohol/week: 0.0  standard drinks  . Drug use: No      Current Outpatient Medications:  .  amoxicillin-clavulanate (AUGMENTIN) 875-125 MG tablet, Take 1 tablet by mouth 2 (two) times daily., Disp: 20 tablet, Rfl: 0 .  atorvastatin (LIPITOR) 10 MG tablet, Take 1 tablet (10 mg total) by mouth daily., Disp: 90 tablet, Rfl: 2 .  baclofen (LIORESAL) 10 MG tablet, Take 10 mg by mouth 3 (three) times daily as needed for muscle spasms. , Disp: , Rfl: 0 .  citalopram (CELEXA) 20 MG tablet, Take 1 tablet (20 mg total) by mouth daily., Disp: 90 tablet, Rfl: 1 .  Galcanezumab-gnlm (EMGALITY [place]), Inject 120 mg into the skin every 30 (thirty) days. , Disp: , Rfl:  .  Multiple Vitamin (MULTIVITAMIN WITH MINERALS) TABS tablet, Take 1 tablet by mouth daily., Disp: , Rfl:  .  OnabotulinumtoxinA (BOTOX IJ), Inject as directed every 3 (three) months., Disp: , Rfl:  .  pantoprazole (PROTONIX) 40 MG tablet, TAKE 1 TABLET BY MOUTH EVERY DAY, Disp: 90 tablet, Rfl: 1 .  promethazine (PHENERGAN) 25 MG tablet, Take 1 tablet (25 mg total) by mouth every 6 (six) hours as needed for nausea or vomiting., Disp: 60 tablet, Rfl: 1 .  triamterene-hydrochlorothiazide (MAXZIDE-25) 37.5-25 MG tablet, TAKE 1 TABLET BY MOUTH DAILY, Disp: 90 tablet, Rfl: 2 .  vitamin C (ASCORBIC ACID) 500 MG tablet, Take 500 mg by mouth daily., Disp: , Rfl:  .  VITAMIN D PO, Take 2,000 Units by mouth daily., Disp: , Rfl:  .  zonisamide (ZONEGRAN) 100 MG capsule, Take 100-300 mg by mouth 2 (two) times daily. , Disp: , Rfl: 1  EXAM: BP Readings from Last 3 Encounters:  11/14/18 125/71  11/29/17 (!) 142/89  11/12/17 (!) 142/82    VITALS per patient if applicable:  GENERAL: alert, oriented, appears well and in no acute distress  HEENT: atraumatic, conjunttiva clear, no obvious abnormalities on inspection of external nose and ears  NECK: normal movements of the head and neck  LUNGS: on inspection no signs of respiratory distress, breathing rate appears normal,  no obvious gross SOB, gasping or wheezing  CV: no obvious cyanosis  MS: moves all visible extremities without noticeable abnormality  PSYCH/NEURO: pleasant and cooperative, no obvious depression or anxiety, speech and thought processing grossly intact Lab Results  Component Value Date   WBC 8.8 05/29/2018   HGB 12.7 05/29/2018   HCT 38.5 05/29/2018  PLT 401.0 (H) 05/29/2018   GLUCOSE 99 05/29/2018   CHOL 253 (H) 05/29/2018   TRIG 135.0 05/29/2018   HDL 44.00 05/29/2018   LDLDIRECT 167.0 06/07/2017   LDLCALC 182 (H) 05/29/2018   ALT 12 05/29/2018   AST 14 05/29/2018   NA 139 05/29/2018   K 3.7 05/29/2018   CL 104 05/29/2018   CREATININE 0.98 05/29/2018   BUN 12 05/29/2018   CO2 25 05/29/2018   TSH 3.02 05/29/2018   INR 1.01 11/11/2017   HGBA1C 6.1 05/29/2018    ASSESSMENT AND PLAN:  Discussed the following assessment and plan:    ICD-10-CM   1. Left paraspinal back pain  M54.89 Ambulatory referral to Sports Medicine [facility]  2. Carpal tunnel syndrome, unspecified laterality suspected   G56.00   3. Transient neurological symptoms described as zaps  123456    uncertain what means    plan inperson visit  after SM evalu for back and other  consdier has had mri head in past but  not c spine    Counseled.   SM MS evaluation for back first may benefit from physical modalities   PT etc .   Then plan rov in person  To address  Issues and plan  Good that her complex migraines are under control  Has risk for thyroid dysfunction  Last tsh  In May   Expectant management and discussion of plan and treatment with opportunity to ask questions and all were answered. The patient agreed with the plan and demonstrated an understanding of the instructions.  Return for in person visit after sm back eval.  Advised to call back or seek an in-person evaluation if worsening  or having  further concerns .  in interim   [name], MD

## 2018-12-15 ENCOUNTER — Encounter: Payer: Self-pay | Admitting: Internal Medicine

## 2018-12-15 ENCOUNTER — Telehealth: Payer: Self-pay | Admitting: Internal Medicine

## 2018-12-20 ENCOUNTER — Ambulatory Visit: Payer: BLUE CROSS/BLUE SHIELD | Admitting: Obstetrics and Gynecology

## 2019-01-02 ENCOUNTER — Other Ambulatory Visit: Payer: Self-pay | Admitting: Internal Medicine

## 2019-01-17 ENCOUNTER — Ambulatory Visit (INDEPENDENT_AMBULATORY_CARE_PROVIDER_SITE_OTHER): Payer: No Typology Code available for payment source

## 2019-01-17 ENCOUNTER — Ambulatory Visit (INDEPENDENT_AMBULATORY_CARE_PROVIDER_SITE_OTHER): Payer: No Typology Code available for payment source | Admitting: Family Medicine

## 2019-01-17 ENCOUNTER — Other Ambulatory Visit: Payer: Self-pay

## 2019-01-17 ENCOUNTER — Encounter: Payer: Self-pay | Admitting: Family Medicine

## 2019-01-17 VITALS — BP 116/90 | HR 87 | Ht 65.5 in | Wt 237.0 lb

## 2019-01-17 DIAGNOSIS — M549 Dorsalgia, unspecified: Secondary | ICD-10-CM | POA: Diagnosis not present

## 2019-01-17 DIAGNOSIS — G2589 Other specified extrapyramidal and movement disorders: Secondary | ICD-10-CM

## 2019-01-17 DIAGNOSIS — G8929 Other chronic pain: Secondary | ICD-10-CM

## 2019-01-17 DIAGNOSIS — M999 Biomechanical lesion, unspecified: Secondary | ICD-10-CM | POA: Insufficient documentation

## 2019-01-17 NOTE — Patient Instructions (Signed)
Iron 65 Vitamin C 500 Calcium pyruvate 1500 MG daily Keep hands within peripheral Tried manipulation today Exercise 3 times a week See me again in 4-5 weeks

## 2019-01-17 NOTE — Assessment & Plan Note (Signed)
Decision today to treat with OMT was based on Physical Exam  After verbal consent patient was treated with HVLA, ME, FPR techniques in cervical, thoracic, rib, lumbar and sacral areas  Patient tolerated the procedure well with improvement in symptoms  Patient given exercises, stretches and lifestyle modifications  See medications in patient instructions if given  Patient will follow up in 4-5 weeks 

## 2019-01-17 NOTE — Assessment & Plan Note (Signed)
Scapular dyskinesis.  Discussed with patient icing regimen, home exercise, which activities to do which was to avoid.  Patient is to increase activity slowly.  Patient will follow up with me after doing the conservative therapy and see me again in 4 to 6 weeks.  Responded well to osteopathic manipulation.

## 2019-01-17 NOTE — Progress Notes (Signed)
[name] [place], [place] [address] Phone: [phone] Subjective:   I [name] am serving as a Education administrator for Dr. Hulan Saas.  This visit occurred during the SARS-CoV-2 public health emergency.  Safety protocols were in place, including screening questions prior to the visit, additional usage of staff PPE, and extensive cleaning of exam room while observing appropriate contact time as indicated for disinfecting solutions.   I'm seeing this patient by the request  of:  Panosh, Standley Brooking, MD   CC: Left-sided pain  [id number]  [name] is a 48 y.o. female coming in with complaint of left sided pain. Pain is worse at night and it keeps her up. Left first 3 toes are numb.   Onset- chronic  Location - left sided  Character- achy, sore  Aggravating factors- ADL  Therapies tried- ice, heat, topical, oral  Severity- 7/10 at its worse     Past Medical History:  Diagnosis Date  . Allergic rhinitis    hx of testing and allergic to spring and fall f= pollens  . Anemia    nos  . Anxiety   . Chest pain    neg stress test summer 2010  . Endometriosis   . Endometriosis   . Fibroids   . GERD (gastroesophageal reflux disease)   . Goiter   . Headache(784.0)   . 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 Hyperlipidemia   . Hypertension   . Irritable bowel syndrome   . Labial cyst 04/08/2011   early infection use hotpcompresses    . Migraines   . Ovarian torsion   . Sinusitis   . STD (sexually transmitted disease)    Pos. Chlamydia--in her 87's  . Thyroid disease    goiter   Past Surgical History:  Procedure Laterality Date  . CESAREAN SECTION  2002, 2004   x2  . IR ANGIOGRAM PELVIS SELECTIVE OR SUPRASELECTIVE  11/11/2017  . IR ANGIOGRAM PELVIS SELECTIVE OR SUPRASELECTIVE  11/11/2017  . IR ANGIOGRAM SELECTIVE EACH ADDITIONAL VESSEL  11/11/2017  . IR ANGIOGRAM SELECTIVE EACH  ADDITIONAL VESSEL  11/11/2017  . IR EMBO TUMOR ORGAN ISCHEMIA INFARCT INC GUIDE ROADMAPPING  11/11/2017  . IR RADIOLOGIST EVAL & MGMT  10/11/2017  . IR RADIOLOGIST EVAL & MGMT  11/29/2017  . IR RADIOLOGIST EVAL & MGMT  08/15/2018  . IR US GUIDE VASC ACCESS RIGHT  11/11/2017  . PELVIC LAPAROSCOPY     x9  . RADIOLOGY WITH ANESTHESIA N/A 09/05/2014   Procedure: MRI OF BRAIN WITHOUT CONTRAST;  Surgeon: Medication Radiologist, MD;  Location: [facility];  Service: Radiology;  Laterality: N/A;  MRI/DR. JAFFE  . TUBAL LIGATION    . UNILATERAL SALPINGECTOMY Right 1995   ectopic pregnancy   Social History   Socioeconomic History  . Marital status: Single    Spouse name: Not on file  . Number of children: Not on file  . Years of education: Not on file  . Highest education level: Not on file  Occupational History  . Not on file  Tobacco Use  . Smoking status: Never Smoker  . Smokeless tobacco: Never Used  Substance and Sexual Activity  . Alcohol use: No    Alcohol/week: 0.0 standard drinks  . Drug use: No  . Sexual activity: Yes    Partners: Male    Birth control/protection: Surgical    Comment: Tubal  Other Topics  Concern  . 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   And graduated as SW.      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 Difficulty of Paying Living Expenses: Not on file  Food Insecurity:   . Worried About Charity fundraiser in the Last Year: Not on file    . Ran Out of Food in the Last Year: Not on file  Transportation Needs:   . Lack of Transportation (Medical): Not on file  . Lack of Transportation (Non-Medical): Not on file  Physical Activity:   . Days of Exercise per Week: Not on file  . Minutes of Exercise per Session: Not on file  Stress:   . Feeling of Stress : Not on file  Social Connections:   . Frequency of Communication with Friends and Family: Not on file  . Frequency of Social Gatherings with Friends and Family: Not on file  . Attends Religious Services: Not on file  . Active Member of Clubs or Organizations: Not on file  . Attends Archivist Meetings: Not on file  . Marital Status: Not on file   Allergies  Allergen Reactions  . Clonidine Derivatives     Skin rash.  . Codeine Phosphate Hives  . Moxifloxacin     REACTION: cause strange sensation, joint pain. Could not get thumbs to move. Stiffness.  . Sulfamethoxazole Hives  . Sumatriptan Succinate Palpitations   Family History  Problem Relation Age of Onset  . Anxiety disorder Mother        mother is on xanax and apparently applying for  social security diabilty for her anxiety. t  . Depression Mother   . Diabetes Mother   . Hypertension Mother   . Heart attack Father   . Seizures Father   . Stroke Maternal Grandmother   . Diabetes Paternal Grandmother   . Hypertension Paternal Grandmother   . Coronary artery disease Other   . Diabetes Other   . Hyperlipidemia Other   . Other Other        history of child death in family   . Colon cancer Neg Hx   . Stomach cancer Neg Hx   . Esophageal cancer Neg Hx      Current Outpatient Medications (Cardiovascular):  .  atorvastatin (LIPITOR) 10 MG tablet, Take 1 tablet (10 mg total) by mouth daily. Marland Kitchen  triamterene-hydrochlorothiazide (MAXZIDE-25) 37.5-25 MG tablet, TAKE 1 TABLET BY MOUTH DAILY  Current Outpatient Medications (Respiratory):  .  promethazine (PHENERGAN) 25 MG tablet, Take 1 tablet (25 mg  total) by mouth every 6 (six) hours as needed for nausea or vomiting.  Current Outpatient Medications (Analgesics):  Marland Kitchen  Galcanezumab-gnlm (EMGALITY ), Inject 120 mg into the skin every 30 (thirty) days.  Current Outpatient Medications (Other):  .  amoxicillin-clavulanate (AUGMENTIN) 875-125 MG tablet, Take 1 tablet by mouth 2 (two) times daily. .  baclofen (LIORESAL) 10 MG tablet, Take 10 mg by mouth 3 (three) times daily as needed for muscle spasms.  .  citalopram (CELEXA) 20 MG tablet, TAKE 1 TABLET(20 MG) BY MOUTH DAILY .  Multiple Vitamin (MULTIVITAMIN WITH MINERALS) TABS tablet, Take 1 tablet by mouth daily. .  OnabotulinumtoxinA (BOTOX IJ), Inject as directed every 3 (three) months. .  pantoprazole (PROTONIX) 40 MG tablet, TAKE 1 TABLET BY MOUTH EVERY DAY .  vitamin C (ASCORBIC ACID) 500 MG tablet, Take 500 mg by mouth daily. Marland Kitchen  VITAMIN D PO, Take 2,000 Units by mouth daily. Marland Kitchen  zonisamide (ZONEGRAN) 100 MG capsule, Take 100-300 mg by mouth 2 (two) times daily.     Past medical history, social, surgical and family history all reviewed in electronic medical record.  No pertanent information unless stated regarding to the chief complaint.   Review of Systems:  No headache, visual changes, nausea, vomiting, diarrhea, constipation, dizziness, abdominal pain, skin rash, fevers, chills, night sweats, weight loss, swollen lymph nodes, body aches, joint swelling, muscle aches, chest pain, shortness of breath, mood changes.   Objective  There were no vitals taken for this visit. Systems examined below as of    General: No apparent distress alert and oriented x3 mood and affect normal, dressed appropriately.  HEENT: Pupils equal, extraocular movements intact  Respiratory: Patient's speak in full sentences and does not appear short of breath  Cardiovascular: No lower extremity edema, non tender, no erythema  Skin: Warm dry intact with no signs of infection or rash on extremities or on  axial skeleton.  Abdomen: Soft nontender  Neuro: Cranial nerves II through XII are intact, neurovascularly intact in all extremities with 2+ DTRs and 2+ pulses.  Lymph: No lymphadenopathy of posterior or anterior cervical chain or axillae bilaterally.  Gait normal with good balance and coordination.  MSK:  tender with full range of motion and good stability and symmetric strength and tone of shoulders, elbows, wrist, hip, knee and ankles bilaterally.  Neck exam does have some loss of lordosis.  Patient does have some tenderness to palpation more in the parascapular region.  Mild scapular dyskinesis on the left side compared to right.  Negative Spurling's.  Patient now is tender diffusely in the paraspinal scapular region with repetitive motions.  5 out of 5 strength though of the shoulder and the upper extremities bilaterally and symmetric.  Low back exam does have poor core strength.  Patient does have 4-5 strength of the hip abductors bilaterally.  Patient does have mild limited range of motion with side bending and rotation.  Tightness with Corky Sox bilaterally.  Osteopathic findings C2 flexed rotated and side bent right C6 flexed rotated and side bent left T3 extended rotated and side bent right inhaled third rib T7 extended rotated and side bent left L2 flexed rotated and side bent right Sacrum right on right  [id number]; 15 additional minutes spent for Therapeutic exercises as stated in above notes.  This included exercises focusing on stretching, strengthening, with significant focus on eccentric aspects.   Long term goals include an improvement in range of motion, strength, endurance as well as avoiding reinjury. Patient's frequency would include in 1-2 times a day, 3-5 times a week for a duration of 6-12 weeks.  Proper technique shown and discussed handout in great detail with ATC.  All questions were discussed and  answered.     Impression and Recommendations:     This case required medical  decision making of moderate complexity. The above documentation has been reviewed and is accurate and complete Renee Pulley, DO       Note: This dictation was prepared with Dragon dictation along with smaller phrase technology. Any transcriptional errors that result from this process are unintentional.

## 2019-01-20 ENCOUNTER — Other Ambulatory Visit: Payer: Self-pay | Admitting: Internal Medicine

## 2019-02-06 ENCOUNTER — Ambulatory Visit: Payer: No Typology Code available for payment source | Attending: Internal Medicine

## 2019-02-06 DIAGNOSIS — Z20822 Contact with and (suspected) exposure to covid-19: Secondary | ICD-10-CM

## 2019-02-07 ENCOUNTER — Other Ambulatory Visit: Payer: Self-pay

## 2019-02-07 ENCOUNTER — Encounter: Payer: Self-pay | Admitting: Internal Medicine

## 2019-02-07 ENCOUNTER — Telehealth (INDEPENDENT_AMBULATORY_CARE_PROVIDER_SITE_OTHER): Payer: No Typology Code available for payment source | Admitting: Internal Medicine

## 2019-02-07 DIAGNOSIS — Z79899 Other long term (current) drug therapy: Secondary | ICD-10-CM | POA: Diagnosis not present

## 2019-02-07 DIAGNOSIS — R0989 Other specified symptoms and signs involving the circulatory and respiratory systems: Secondary | ICD-10-CM

## 2019-02-07 DIAGNOSIS — Z8669 Personal history of other diseases of the nervous system and sense organs: Secondary | ICD-10-CM

## 2019-02-07 DIAGNOSIS — Z8616 Personal history of COVID-19: Secondary | ICD-10-CM | POA: Diagnosis not present

## 2019-02-07 DIAGNOSIS — R739 Hyperglycemia, unspecified: Secondary | ICD-10-CM

## 2019-02-07 DIAGNOSIS — E611 Iron deficiency: Secondary | ICD-10-CM

## 2019-02-07 DIAGNOSIS — E785 Hyperlipidemia, unspecified: Secondary | ICD-10-CM

## 2019-02-07 DIAGNOSIS — E049 Nontoxic goiter, unspecified: Secondary | ICD-10-CM

## 2019-02-07 DIAGNOSIS — R7989 Other specified abnormal findings of blood chemistry: Secondary | ICD-10-CM

## 2019-02-07 NOTE — Progress Notes (Signed)
Virtual Visit via Video Note  I connected with@ on 02/07/19 at 12:00 PM EST by a video enabled telemedicine application and verified that I am speaking with the correct person using two identifiers. Location patient: home Location provider:work  office Persons participating in the virtual visit: patient, provider  [place] national recommendations  regarding COVID 19 pandemic   video visit is advised over in office visit for this patient.  Patient aware  of the limitations of evaluation and management by telemedicine and  availability of in person appointments. and agreed to proceed.   HPI: [name] presents for video visit  Last visit 10 20 for sinusitis  restested for  covid  Wilburn Mylar was p[ositive  August 2020 Over the last 3 days she has developed a scratchy sore throat and some sneezing upper respiratory symptoms she has had a cough ever since her Covid diagnosis in October but it has not really changed she has had a minor I redness or irritation for which she is using drops but no associated fever.  Over the weekend she had a couple episodes of 30 minutes of sweating all over but no temp when documented she is on a new medicine for her migraines that could cause sweating so is not sure if related Her blood pressures been controlled in the 120 range She has been seen Dr. Tamala Julian for her scapular dyskinesia and was told she had iron deficiency began taking iron supplements.  Asks about checking levels at some point. She does have a history of goiter borderline hyperglycemia and elevated lipids.  Her last full set of labs was May of last year.  She has been out of work because of her respiratory symptoms and working from home.  She did get Covid tested yesterday and at some point will need a note to clear her to get back to onsite work.    ROS: See pertinent positives and negatives per HPI.  Past Medical History:  Diagnosis Date  . Allergic rhinitis    hx of testing and allergic  to spring and fall f= pollens  . Anemia    nos  . Anxiety   . Chest pain    neg stress test summer 2010  . Endometriosis   . Endometriosis   . Fibroids   . GERD (gastroesophageal reflux disease)   . Goiter   . Headache(784.0)   . 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 Hyperlipidemia   . Hypertension   . Irritable bowel syndrome   . Labial cyst 04/08/2011   early infection use hotpcompresses    . Migraines   . Ovarian torsion   . Sinusitis   . STD (sexually transmitted disease)    Pos. Chlamydia--in her 25's  . Thyroid disease    goiter    Past Surgical History:  Procedure Laterality Date  . CESAREAN SECTION  2002, 2004   x2  . IR ANGIOGRAM PELVIS SELECTIVE OR SUPRASELECTIVE  11/11/2017  . IR ANGIOGRAM PELVIS SELECTIVE OR SUPRASELECTIVE  11/11/2017  . IR ANGIOGRAM SELECTIVE EACH ADDITIONAL VESSEL  11/11/2017  . IR ANGIOGRAM SELECTIVE EACH ADDITIONAL VESSEL  11/11/2017  . IR EMBO TUMOR ORGAN ISCHEMIA INFARCT INC GUIDE ROADMAPPING  11/11/2017  . IR RADIOLOGIST EVAL & MGMT  10/11/2017  . IR RADIOLOGIST EVAL & MGMT  11/29/2017  . IR RADIOLOGIST EVAL & MGMT  08/15/2018  . IR US GUIDE VASC ACCESS RIGHT  11/11/2017  . PELVIC  LAPAROSCOPY     x9  . RADIOLOGY WITH ANESTHESIA N/A 09/05/2014   Procedure: MRI OF BRAIN WITHOUT CONTRAST;  Surgeon: Medication Radiologist, MD;  Location: [facility];  Service: Radiology;  Laterality: N/A;  MRI/DR. JAFFE  . TUBAL LIGATION    . UNILATERAL SALPINGECTOMY Right 1995   ectopic pregnancy    Family History  Problem Relation Age of Onset  . Anxiety disorder Mother        mother is on xanax and apparently applying for  social security diabilty for her anxiety. t  . Depression Mother   . Diabetes Mother   . Hypertension Mother   . Heart attack Father   . Seizures Father   . Stroke Maternal Grandmother   . Diabetes Paternal Grandmother   . Hypertension Paternal Grandmother   . Coronary artery disease  Other   . Diabetes Other   . Hyperlipidemia Other   . Other Other        history of child death in family   . Colon cancer Neg Hx   . Stomach cancer Neg Hx   . Esophageal cancer Neg Hx     Social History   Tobacco Use  . Smoking status: Never Smoker  . Smokeless tobacco: Never Used  Substance Use Topics  . Alcohol use: No    Alcohol/week: 0.0 standard drinks  . Drug use: No      Current Outpatient Medications:  .  atorvastatin (LIPITOR) 10 MG tablet, Take 1 tablet (10 mg total) by mouth daily., Disp: 90 tablet, Rfl: 2 .  baclofen (LIORESAL) 10 MG tablet, Take 10 mg by mouth 3 (three) times daily as needed for muscle spasms. , Disp: , Rfl: 0 .  citalopram (CELEXA) 20 MG tablet, TAKE 1 TABLET(20 MG) BY MOUTH DAILY, Disp: 90 tablet, Rfl: 1 .  Galcanezumab-gnlm (EMGALITY [place]), Inject 120 mg into the skin every 30 (thirty) days. , Disp: , Rfl:  .  Multiple Vitamin (MULTIVITAMIN WITH MINERALS) TABS tablet, Take 1 tablet by mouth daily., Disp: , Rfl:  .  OnabotulinumtoxinA (BOTOX IJ), Inject as directed every 3 (three) months., Disp: , Rfl:  .  pantoprazole (PROTONIX) 40 MG tablet, TAKE 1 TABLET BY MOUTH EVERY DAY, Disp: 90 tablet, Rfl: 1 .  promethazine (PHENERGAN) 25 MG tablet, Take 1 tablet (25 mg total) by mouth every 6 (six) hours as needed for nausea or vomiting., Disp: 60 tablet, Rfl: 1 .  triamterene-hydrochlorothiazide (MAXZIDE-25) 37.5-25 MG tablet, TAKE 1 TABLET BY MOUTH DAILY, Disp: 90 tablet, Rfl: 2 .  VITAMIN D PO, Take 2,000 Units by mouth daily., Disp: , Rfl:  .  zonisamide (ZONEGRAN) 100 MG capsule, Take 100-300 mg by mouth 2 (two) times daily. , Disp: , Rfl: 1  EXAM: BP Readings from Last 3 Encounters:  01/17/19 116/90  11/14/18 125/71  11/29/17 (!) 142/89   Wt Readings from Last 3 Encounters:  01/17/19 237 lb (107.5 kg)  11/13/18 230 lb (104.3 kg)  05/24/18 222 lb (100.7 kg)    VITALS per patient if applicable:  GENERAL: alert, oriented, appears well and  in no acute distress  HEENT: atraumatic, conjunttiva clear, no obvious abnormalities on inspection of external nose and ears  NECK: normal movements of the head and neck  LUNGS: on inspection no signs of respiratory distress, breathing rate appears normal, no obvious gross SOB, gasping or wheezing  CV: no obvious cyanosis PSYCH/NEURO: pleasant and cooperative, no obvious depression or anxiety, speech and thought processing grossly intact Lab Results  Component Value Date   WBC 8.8 05/29/2018   HGB 12.7 05/29/2018   HCT 38.5 05/29/2018   PLT 401.0 (H) 05/29/2018   GLUCOSE 99 05/29/2018   CHOL 253 (H) 05/29/2018   TRIG 135.0 05/29/2018   HDL 44.00 05/29/2018   LDLDIRECT 167.0 06/07/2017   LDLCALC 182 (H) 05/29/2018   ALT 12 05/29/2018   AST 14 05/29/2018   NA 139 05/29/2018   K 3.7 05/29/2018   CL 104 05/29/2018   CREATININE 0.98 05/29/2018   BUN 12 05/29/2018   CO2 25 05/29/2018   TSH 3.02 05/29/2018   INR 1.01 11/11/2017   HGBA1C 6.1 05/29/2018    ASSESSMENT AND PLAN:  Discussed the following assessment and plan:    ICD-10-CM   1. Upper respiratory symptom  R09.89 CBC with Differential    Basic metabolic panel    Hemoglobin A1c    Hepatic function panel    TSH    T4, Free (Thyrox)    IBC + Ferritin [place]    Lipid panel  2. Medication management  Z79.899 CBC with Differential    Basic metabolic panel    Hemoglobin A1c    Hepatic function panel    TSH    T4, Free (Thyrox)    IBC + Ferritin [place]    Lipid panel  3. Hyperglycemia  R73.9 CBC with Differential    Basic metabolic panel    Hemoglobin A1c    Hepatic function panel    TSH    T4, Free (Thyrox)    IBC + Ferritin [place]    Lipid panel  4. History of 2019 novel coronavirus disease (COVID-19)  Z86.16   5. Iron deficiency suspected   E61.1 CBC with Differential    Basic metabolic panel    Hemoglobin A1c    Hepatic function panel    TSH    T4, Free (Thyrox)    IBC + Ferritin [place]     Lipid panel  6. Hx of migraine headaches  Z86.69 CBC with Differential    Basic metabolic panel    Hemoglobin A1c    Hepatic function panel    TSH    T4, Free (Thyrox)    IBC + Ferritin [place]    Lipid panel  7. Goiter  E04.9 CBC with Differential    Basic metabolic panel    Hemoglobin A1c    Hepatic function panel    TSH    T4, Free (Thyrox)    IBC + Ferritin [place]    Lipid panel  8. Hx of atypical migraine  Z86.69 CBC with Differential    Basic metabolic panel    Hemoglobin A1c    Hepatic function panel    TSH    T4, Free (Thyrox)    IBC + Ferritin [place]    Lipid panel  9. Low T4  R79.89 CBC with Differential    Basic metabolic panel    Hemoglobin A1c    Hepatic function panel    TSH    T4, Free (Thyrox)    IBC + Ferritin [place]    Lipid panel  10. Hyperlipidemia, unspecified hyperlipidemia type  E78.5 CBC with Differential    Basic metabolic panel    Hemoglobin A1c    Hepatic function panel    TSH    T4, Free (Thyrox)    IBC + Ferritin [place]    Lipid panel   Agree with Covid testing if her test is negative and no other untoward symptoms we can clear  her to go back to work.  It is unlikely that she has Covid again. No indication for antibiotic  prob viral cause In regard to suspicious for iron deficiency and history of thrombocytosis plan lab work to be done at the [facility] lab before her next appointment with Dr. Tamala Julian.  She will be due for lab monitoring anyway in the near future. Uncertain if the sweating was related to her medication or the illness but will follow.  Counseled.   Expectant management and discussion of plan and treatment with opportunity to ask questions and all were answered. The patient agreed with the plan and demonstrated an understanding of the instructions.   Advised to call back or seek an in-person evaluation if worsening  or having  further concerns . Return for labs as dicussed  and fu contact about covid testsing and medical  status .   [name], MD   Outside external source  DATA REVIEWED:   Notes  From SM and neuro previous  eval for covid  And  Lab  Ordering and fu planning   Will need note for work also   Independent historian:  Total time on date  of service including record review ordering and plan of care:   32 minutes

## 2019-02-08 LAB — NOVEL CORONAVIRUS, NAA: SARS-CoV-2, NAA: NOT DETECTED

## 2019-02-09 ENCOUNTER — Encounter: Payer: Self-pay | Admitting: Internal Medicine

## 2019-02-09 NOTE — Progress Notes (Signed)
Letter sent.

## 2019-02-13 ENCOUNTER — Telehealth (INDEPENDENT_AMBULATORY_CARE_PROVIDER_SITE_OTHER): Payer: BLUE CROSS/BLUE SHIELD | Admitting: Internal Medicine

## 2019-02-13 ENCOUNTER — Other Ambulatory Visit: Payer: Self-pay

## 2019-02-13 ENCOUNTER — Telehealth: Payer: Self-pay | Admitting: *Deleted

## 2019-02-13 ENCOUNTER — Encounter: Payer: Self-pay | Admitting: Internal Medicine

## 2019-02-13 DIAGNOSIS — J029 Acute pharyngitis, unspecified: Secondary | ICD-10-CM | POA: Diagnosis not present

## 2019-02-13 DIAGNOSIS — R509 Fever, unspecified: Secondary | ICD-10-CM | POA: Diagnosis not present

## 2019-02-13 DIAGNOSIS — Z8616 Personal history of COVID-19: Secondary | ICD-10-CM

## 2019-02-13 NOTE — Telephone Encounter (Signed)
Patient called after hours line 02/12/2019 at 10:20 PM and reports she had a negative Covid test last week, has been quarantined, now has fever of 101.5, which went up from 99 in an hour even after taking Tylenol, plus sore throat.

## 2019-02-13 NOTE — Telephone Encounter (Signed)
Pt has been made virtual appt

## 2019-02-13 NOTE — Progress Notes (Signed)
Virtual Visit via Video Note  I connected with@ on 02/13/19 at  2:30 PM EST by a video enabled telemedicine application and verified that I am speaking with the correct person using two identifiers. Location patient: home Location provider:work  office Persons participating in the virtual visit: patient, provider  [place] national recommendations  regarding COVID 19 pandemic   video visit is advised over in office visit for this patient.  Patient aware  of the limitations of evaluation and management by telemedicine and  availability of in person appointments. and agreed to proceed.   HPI: [name] presents for video visit she had been recovering from what was felt to be a viral upper respiratory infection Covid negative and thought she was better and then developed a sore throat making it difficult to swallow and a fever of 101 and then 102.5.  She is taking Tylenol every 4-6 hours temp comes down to only 100.  She has no major cough still has some upper congestion but the main symptoms are fever body aches and a very sore throat. Her 2 children at home are not sick. She was not able to work to get a because she of the way she felt. No unusual rashes vomiting or diarrhea.  ROS: See pertinent positives and negatives per HPI.  Past Medical History:  Diagnosis Date  . Allergic rhinitis    hx of testing and allergic to spring and fall f= pollens  . Anemia    nos  . Anxiety   . Chest pain    neg stress test summer 2010  . Endometriosis   . Endometriosis   . Fibroids   . GERD (gastroesophageal reflux disease)   . Goiter   . Headache(784.0)   . 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 Hyperlipidemia   . Hypertension   . Irritable bowel syndrome   . Labial cyst 04/08/2011   early infection use hotpcompresses    . Migraines   . Ovarian torsion   . Sinusitis   . STD (sexually transmitted disease)    Pos. Chlamydia--in her 8's  .  Thyroid disease    goiter    Past Surgical History:  Procedure Laterality Date  . CESAREAN SECTION  2002, 2004   x2  . IR ANGIOGRAM PELVIS SELECTIVE OR SUPRASELECTIVE  11/11/2017  . IR ANGIOGRAM PELVIS SELECTIVE OR SUPRASELECTIVE  11/11/2017  . IR ANGIOGRAM SELECTIVE EACH ADDITIONAL VESSEL  11/11/2017  . IR ANGIOGRAM SELECTIVE EACH ADDITIONAL VESSEL  11/11/2017  . IR EMBO TUMOR ORGAN ISCHEMIA INFARCT INC GUIDE ROADMAPPING  11/11/2017  . IR RADIOLOGIST EVAL & MGMT  10/11/2017  . IR RADIOLOGIST EVAL & MGMT  11/29/2017  . IR RADIOLOGIST EVAL & MGMT  08/15/2018  . IR US GUIDE VASC ACCESS RIGHT  11/11/2017  . PELVIC LAPAROSCOPY     x9  . RADIOLOGY WITH ANESTHESIA N/A 09/05/2014   Procedure: MRI OF BRAIN WITHOUT CONTRAST;  Surgeon: Medication Radiologist, MD;  Location: [facility];  Service: Radiology;  Laterality: N/A;  MRI/DR. JAFFE  . TUBAL LIGATION    . UNILATERAL SALPINGECTOMY Right 1995   ectopic pregnancy    Family History  Problem Relation Age of Onset  . Anxiety disorder Mother        mother is on xanax and apparently applying for  social security diabilty for her anxiety. t  . Depression Mother   . Diabetes Mother   . Hypertension Mother   .  Heart attack Father   . Seizures Father   . Stroke Maternal Grandmother   . Diabetes Paternal Grandmother   . Hypertension Paternal Grandmother   . Coronary artery disease Other   . Diabetes Other   . Hyperlipidemia Other   . Other Other        history of child death in family   . Colon cancer Neg Hx   . Stomach cancer Neg Hx   . Esophageal cancer Neg Hx     Social History   Tobacco Use  . Smoking status: Never Smoker  . Smokeless tobacco: Never Used  Substance Use Topics  . Alcohol use: No    Alcohol/week: 0.0 standard drinks  . Drug use: No      Current Outpatient Medications:  .  atorvastatin (LIPITOR) 10 MG tablet, Take 1 tablet (10 mg total) by mouth daily., Disp: 90 tablet, Rfl: 2 .  baclofen (LIORESAL) 10 MG  tablet, Take 10 mg by mouth 3 (three) times daily as needed for muscle spasms. , Disp: , Rfl: 0 .  citalopram (CELEXA) 20 MG tablet, TAKE 1 TABLET(20 MG) BY MOUTH DAILY, Disp: 90 tablet, Rfl: 1 .  Galcanezumab-gnlm (EMGALITY [place]), Inject 120 mg into the skin every 30 (thirty) days. , Disp: , Rfl:  .  Multiple Vitamin (MULTIVITAMIN WITH MINERALS) TABS tablet, Take 1 tablet by mouth daily., Disp: , Rfl:  .  OnabotulinumtoxinA (BOTOX IJ), Inject as directed every 3 (three) months., Disp: , Rfl:  .  pantoprazole (PROTONIX) 40 MG tablet, TAKE 1 TABLET BY MOUTH EVERY DAY, Disp: 90 tablet, Rfl: 1 .  promethazine (PHENERGAN) 25 MG tablet, Take 1 tablet (25 mg total) by mouth every 6 (six) hours as needed for nausea or vomiting., Disp: 60 tablet, Rfl: 1 .  triamterene-hydrochlorothiazide (MAXZIDE-25) 37.5-25 MG tablet, TAKE 1 TABLET BY MOUTH DAILY, Disp: 90 tablet, Rfl: 2 .  VITAMIN D PO, Take 2,000 Units by mouth daily., Disp: , Rfl:  .  zonisamide (ZONEGRAN) 100 MG capsule, Take 100-300 mg by mouth 2 (two) times daily. , Disp: , Rfl: 1  EXAM: BP Readings from Last 3 Encounters:  01/17/19 116/90  11/14/18 125/71  11/29/17 (!) 142/89    VITALS per patient if applicable:  GENERAL: alert, oriented, appears well and in no acute distress she appears ill and uncomfortable but nontoxic and her speech is normal without muffling.  There is no stridor or respiratory distress occasional sniffles no drooling    HEENT: atraumatic, conjunttiva clear, no obvious abnormalities on inspection of external nose and ears NECK: normal movements of the head and neck when she touches her anterior lymph node area she says it is sore but I do not see obvious swelling.  LUNGS: on inspection no signs of respiratory distress, breathing rate appears normal, no obvious gross SOB, gasping or wheezing  CV: no obvious cyanosis  PSYCH/NEURO: pleasant and cooperative, no obvious depression or anxiety, speech and thought processing  grossly intact   ASSESSMENT AND PLAN:  Discussed the following assessment and plan:    ICD-10-CM   1. Fever with sore throat  R50.9    J02.9   2. History of 2019 novel coronavirus disease (COVID-19)  Z86.09 September 2017?   It is difficult to tell if this is progression of the original what seemed like minor illness or a new illness.  She has had documented Covid illness in the last 3 months making it less likely to be related as well is  negative Covid testing on January 12. Whether she should be retested remains to be seen but I advise she need an in person visit an appointment to be made at the respiratory clinic.  Consideration of strep or other.  Appointment made for 715 tomorrow January 20 discussed alarm symptoms that she should get further help should stay hydrated can try ibuprofen for symptomatic relief. Counseled.   Expectant management and discussion of plan and treatment with opportunity to ask questions and all were answered. The patient agreed with the plan and demonstrated an understanding of the instructions.   Advised to call back or seek an in-person evaluation if worsening  or having  further concerns .  As discussed. She will get back with me if we need to write a new note depending on her clinical status on return to work at this time as long as she has a fever she should be excused from work. Return for resp clinicn tonorrow  7 15  and earlier if needed   .    DATA REVIEWED:   Record  Prev evaluations   Total time on date  of service including record review ordering and plan of care:  30 minutes  \ [name], MD

## 2019-02-14 ENCOUNTER — Encounter (INDEPENDENT_AMBULATORY_CARE_PROVIDER_SITE_OTHER): Payer: Self-pay

## 2019-02-14 ENCOUNTER — Ambulatory Visit: Payer: BLUE CROSS/BLUE SHIELD | Admitting: Obstetrics and Gynecology

## 2019-02-14 ENCOUNTER — Ambulatory Visit (INDEPENDENT_AMBULATORY_CARE_PROVIDER_SITE_OTHER): Payer: BLUE CROSS/BLUE SHIELD | Admitting: Medical

## 2019-02-14 VITALS — BP 122/82 | HR 100 | Temp 99.5°F | Wt 244.6 lb

## 2019-02-14 DIAGNOSIS — R52 Pain, unspecified: Secondary | ICD-10-CM

## 2019-02-14 DIAGNOSIS — J029 Acute pharyngitis, unspecified: Secondary | ICD-10-CM

## 2019-02-14 DIAGNOSIS — R509 Fever, unspecified: Secondary | ICD-10-CM

## 2019-02-14 MED ORDER — AMOXICILLIN 400 MG/5ML PO SUSR: 500.0000 mg | Freq: Three times a day (TID) | ORAL | 0 refills | Status: AC

## 2019-02-14 NOTE — Progress Notes (Signed)
[facility]   Subjective:  [name] is a 49 y.o. female who presents for respiratory illness.    PCP:  Burnis Medin, MD  Here for respiratory illness.  Her symptoms started 9 days ago with acute onset, rapid onset of horrible sore throat, chest discomfort, swollen lymph nodes, body aches, chills, sweats.  She has had cough on and off.  Some headaches.  Fever up to 102.  She had a Covid test 2 days after her symptoms began last week and it was negative.  She notes that she was positive for Covid back last fall in August and had symptoms and was ill for about 3-1/2 weeks.  She had loss of sense of smell and taste that lasted 2 months.  She denies nausea, vomiting, diarrhea, no urinary complaints.  No rash.  She does not feel short of breath.  She is a non-smoker.  No other aggravating or relieving factors.  No other c/o.  Past Medical History:  Diagnosis Date  . Allergic rhinitis    hx of testing and allergic to spring and fall f= pollens  . Anemia    nos  . Anxiety   . Chest pain    neg stress test summer 2010  . Endometriosis   . Endometriosis   . Fibroids   . GERD (gastroesophageal reflux disease)   . Goiter   . Headache(784.0)   . 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 Hyperlipidemia   . Hypertension   . Irritable bowel syndrome   . Labial cyst 04/08/2011   early infection use hotpcompresses    . Migraines   . Ovarian torsion   . Sinusitis   . STD (sexually transmitted disease)    Pos. Chlamydia--in her 90's  . Thyroid disease    goiter   Current Outpatient Medications on File Prior to Visit  Medication Sig Dispense Refill  . atorvastatin (LIPITOR) 10 MG tablet Take 1 tablet (10 mg total) by mouth daily. 90 tablet 2  . baclofen (LIORESAL) 10 MG tablet Take 10 mg by mouth 3 (three) times daily as needed for muscle spasms.   0  . citalopram (CELEXA) 20 MG tablet TAKE 1 TABLET(20 MG) BY MOUTH DAILY 90  tablet 1  . Galcanezumab-gnlm (EMGALITY [place]) Inject 120 mg into the skin every 30 (thirty) days.     . Multiple Vitamin (MULTIVITAMIN WITH MINERALS) TABS tablet Take 1 tablet by mouth daily.    . OnabotulinumtoxinA (BOTOX IJ) Inject as directed every 3 (three) months.    . pantoprazole (PROTONIX) 40 MG tablet TAKE 1 TABLET BY MOUTH EVERY DAY 90 tablet 1  . promethazine (PHENERGAN) 25 MG tablet Take 1 tablet (25 mg total) by mouth every 6 (six) hours as needed for nausea or vomiting. 60 tablet 1  . triamterene-hydrochlorothiazide (MAXZIDE-25) 37.5-25 MG tablet TAKE 1 TABLET BY MOUTH DAILY 90 tablet 2  . VITAMIN D PO Take 2,000 Units by mouth daily.    Marland Kitchen zonisamide (ZONEGRAN) 100 MG capsule Take 100-300 mg by mouth 2 (two) times daily.   1   No current facility-administered medications on file prior to visit.     ROS as in subjective   Objective: BP 122/82   Pulse 100   Temp 99.5 F (37.5 C)   Wt 244 lb 9.6 oz (110.9 kg)   SpO2 98%   BMI 40.08 kg/m   General appearance: Alert, WD/WN, no  distress, mildly ill appearing                             Skin: warm, no rash                           Head: no sinus tenderness                            Eyes: conjunctiva with erythema bilaterally, corneas clear, PERRLA                            Ears: pearly TMs, external ear canals normal                          Nose: septum midline, turbinates normal              Mouth/throat: MMM, tongue normal, mild to moderate, mild whitish exudate, pharyngeal erythema                           Neck: supple, shotty tender bilateral anterior nodes, no thyromegaly, non tender                          Heart: RRR, normal S1, S2, no murmurs                         Lungs: CTA bilaterally, no wheezes, rales, or rhonchi       Assessment  Encounter Diagnoses  Name Primary?  . Sore throat Yes  . Fever, unspecified fever cause   . Aches       Plan: We discussed her collection of symptoms and possible  differential.  She had Covid back in the fall 2020 with symptoms.  There is the possibility this could be Covid again but it could also be influenza, other viral respiratory infection, tonsillitis, strep throat.  We went ahead and did repeat Covid test today, strep throat swab.  Her worst symptom overall is the throat so we will empirically treat with amoxicillin.  She did not want tablet so we will use liquid amoxicillin.  We discussed supportive care including rest, hydration, salt water gargles, analgesic.  Quarantine for now as discussed.  She has beyond the timeframe in which Tamiflu would help.  Her lungs are clear so we declined jointly to get a chest x-ray at this time.  If much worse in the next few days, PCP or get reevaluated  Aryiah was seen today for sore throat.  Diagnoses and all orders for this visit:  Sore throat -     Novel Coronavirus, NAA ([facility]) -     Culture, Group A Strep; Future -     Temperature monitoring; Future  Fever, unspecified fever cause -     Novel Coronavirus, NAA ([facility]) -     Culture, Group A Strep; Future -     Temperature monitoring; Future  Aches -     Novel Coronavirus, NAA ([facility]) -     Culture, Group A Strep; Future -     Temperature monitoring; Future  Other orders -     amoxicillin (AMOXIL) 400 MG/5ML suspension; Take 6.3 mLs (500 mg total) by mouth 3 (three) times  daily. -     MyChart COVID-19 home monitoring program; Future  Follow-up pending Covid and strep swabs

## 2019-02-15 ENCOUNTER — Encounter (INDEPENDENT_AMBULATORY_CARE_PROVIDER_SITE_OTHER): Payer: Self-pay

## 2019-02-15 ENCOUNTER — Telehealth: Payer: Self-pay

## 2019-02-15 NOTE — Telephone Encounter (Signed)
[place] Call - [place] patient - left detailed message on voicemail reviewing cough protocol from mychart companion algorithm for home monitoring of COVID19. Advised patient if her coughing gets worse and she has no relief, to contact her PCP immediately for more options. Advised to call back if she has any questions/concerns.  I will send a message as well.

## 2019-02-16 ENCOUNTER — Encounter (INDEPENDENT_AMBULATORY_CARE_PROVIDER_SITE_OTHER): Payer: Self-pay

## 2019-02-16 ENCOUNTER — Telehealth: Payer: Self-pay | Admitting: Internal Medicine

## 2019-02-16 LAB — NOVEL CORONAVIRUS, NAA: SARS-CoV-2, NAA: NOT DETECTED

## 2019-02-16 LAB — SPECIMEN STATUS REPORT

## 2019-02-16 NOTE — Telephone Encounter (Signed)
So you can return  After 24 hours of amoxicillin and  No fever for 24 hours   [place] please  Define this information and get her a note  return to work With respective  Appropriate dates .  Hope she is feeling better

## 2019-02-16 NOTE — Telephone Encounter (Signed)
The patient would like to know her strep test result, which was done at the respiratory clinic. She called the clinic to get the results; she was told they did not have her results.  The pt is requesting a return to work note for the dates as followed from  January 18-January 25. she would like her note to be uploaded on her my chart account.  She can be reached at  [phone] if you have any additional questions and when this is done.

## 2019-02-16 NOTE — Telephone Encounter (Signed)
Letter was already sent

## 2019-02-16 NOTE — Telephone Encounter (Signed)
Please give her note letter for work absence and return to work

## 2019-02-16 NOTE — Telephone Encounter (Signed)
Spoke with patient and let her know that it can take a couple days for strep culture to come back but will be uploaded on mychart . Please advise on note

## 2019-02-17 ENCOUNTER — Encounter (INDEPENDENT_AMBULATORY_CARE_PROVIDER_SITE_OTHER): Payer: Self-pay

## 2019-02-19 ENCOUNTER — Encounter (INDEPENDENT_AMBULATORY_CARE_PROVIDER_SITE_OTHER): Payer: Self-pay

## 2019-02-20 ENCOUNTER — Ambulatory Visit: Payer: No Typology Code available for payment source | Admitting: Family Medicine

## 2019-02-22 ENCOUNTER — Encounter (INDEPENDENT_AMBULATORY_CARE_PROVIDER_SITE_OTHER): Payer: Self-pay

## 2019-03-08 ENCOUNTER — Ambulatory Visit (LOCAL_COMMUNITY_HEALTH_CENTER): Payer: Self-pay

## 2019-03-08 ENCOUNTER — Other Ambulatory Visit: Payer: Self-pay

## 2019-03-08 DIAGNOSIS — Z23 Encounter for immunization: Secondary | ICD-10-CM

## 2019-03-08 NOTE — Progress Notes (Signed)
Flu vaccine given; tolerated well Nychelle Cassata, RN  

## 2019-03-27 ENCOUNTER — Other Ambulatory Visit (INDEPENDENT_AMBULATORY_CARE_PROVIDER_SITE_OTHER): Payer: Managed Care, Other (non HMO)

## 2019-03-27 DIAGNOSIS — Z79899 Other long term (current) drug therapy: Secondary | ICD-10-CM | POA: Diagnosis not present

## 2019-03-27 DIAGNOSIS — Z8669 Personal history of other diseases of the nervous system and sense organs: Secondary | ICD-10-CM

## 2019-03-27 DIAGNOSIS — R0989 Other specified symptoms and signs involving the circulatory and respiratory systems: Secondary | ICD-10-CM | POA: Diagnosis not present

## 2019-03-27 DIAGNOSIS — E785 Hyperlipidemia, unspecified: Secondary | ICD-10-CM | POA: Diagnosis not present

## 2019-03-27 DIAGNOSIS — E049 Nontoxic goiter, unspecified: Secondary | ICD-10-CM | POA: Diagnosis not present

## 2019-03-27 DIAGNOSIS — R7989 Other specified abnormal findings of blood chemistry: Secondary | ICD-10-CM

## 2019-03-27 DIAGNOSIS — E611 Iron deficiency: Secondary | ICD-10-CM | POA: Diagnosis not present

## 2019-03-27 DIAGNOSIS — R739 Hyperglycemia, unspecified: Secondary | ICD-10-CM | POA: Diagnosis not present

## 2019-03-27 LAB — CBC WITH DIFFERENTIAL/PLATELET
Basophils Absolute: 0.1 10*3/uL (ref 0.0–0.1)
Basophils Relative: 1.3 % (ref 0.0–3.0)
Eosinophils Absolute: 0.2 10*3/uL (ref 0.0–0.7)
Eosinophils Relative: 2.8 % (ref 0.0–5.0)
HCT: 40.3 % (ref 36.0–46.0)
Hemoglobin: 13.2 g/dL (ref 12.0–15.0)
Lymphocytes Relative: 46 % (ref 12.0–46.0)
Lymphs Abs: 4 10*3/uL (ref 0.7–4.0)
MCHC: 32.8 g/dL (ref 30.0–36.0)
MCV: 91.6 fl (ref 78.0–100.0)
Monocytes Absolute: 0.5 10*3/uL (ref 0.1–1.0)
Monocytes Relative: 5.5 % (ref 3.0–12.0)
Neutro Abs: 3.8 10*3/uL (ref 1.4–7.7)
Neutrophils Relative %: 44.4 % (ref 43.0–77.0)
Platelets: 414 10*3/uL — ABNORMAL HIGH (ref 150.0–400.0)
RBC: 4.39 Mil/uL (ref 3.87–5.11)
RDW: 13.5 % (ref 11.5–15.5)
WBC: 8.6 10*3/uL (ref 4.0–10.5)

## 2019-03-27 LAB — BASIC METABOLIC PANEL
BUN: 15 mg/dL (ref 6–23)
CO2: 25 mEq/L (ref 19–32)
Calcium: 9.5 mg/dL (ref 8.4–10.5)
Chloride: 107 mEq/L (ref 96–112)
Creatinine, Ser: 0.99 mg/dL (ref 0.40–1.20)
GFR: 72.43 mL/min (ref >60.00)
Glucose, Bld: 118 mg/dL — ABNORMAL HIGH (ref 70–99)
Potassium: 3.6 mEq/L (ref 3.5–5.1)
Sodium: 141 mEq/L (ref 135–145)

## 2019-03-27 LAB — TSH: TSH: 1.65 u[IU]/mL (ref 0.35–4.50)

## 2019-03-27 LAB — IBC + FERRITIN
Ferritin: 75 ng/mL (ref 10.0–291.0)
Iron: 67 ug/dL (ref 42–145)
Saturation Ratios: 22.7 % (ref 20.0–50.0)
Transferrin: 211 mg/dL — ABNORMAL LOW (ref 212.0–360.0)

## 2019-03-27 LAB — HEPATIC FUNCTION PANEL
ALT: 22 U/L (ref 0–35)
AST: 20 U/L (ref 0–37)
Albumin: 4.2 g/dL (ref 3.5–5.2)
Alkaline Phosphatase: 59 U/L (ref 39–117)
Bilirubin, Direct: 0.1 mg/dL (ref 0.0–0.3)
Total Bilirubin: 0.3 mg/dL (ref 0.2–1.2)
Total Protein: 7.4 g/dL (ref 6.0–8.3)

## 2019-03-27 LAB — LIPID PANEL
Cholesterol: 171 mg/dL (ref 0–200)
HDL: 42.6 mg/dL (ref >39.00)
LDL Cholesterol: 112 mg/dL — ABNORMAL HIGH (ref 0–99)
NonHDL: 128.45
Total CHOL/HDL Ratio: 4
Triglycerides: 80 mg/dL (ref 0.0–149.0)
VLDL: 16 mg/dL (ref 0.0–40.0)

## 2019-03-27 LAB — HEMOGLOBIN A1C: Hgb A1c MFr Bld: 6 % (ref 4.6–6.5)

## 2019-03-27 LAB — T4, FREE: Free T4: 0.66 ng/dL (ref 0.60–1.60)

## 2019-03-30 NOTE — Progress Notes (Signed)
Blood sugar borderline elevated but a1c is stable at 6  pre diabetic range   No anemia  cholesterol is much better  , liver panel normal

## 2019-04-03 ENCOUNTER — Ambulatory Visit: Payer: BLUE CROSS/BLUE SHIELD | Admitting: Family Medicine

## 2019-04-03 NOTE — Progress Notes (Deleted)
[name] Phone: [phone] Subjective:    I'm seeing this patient by the request  of:  Panosh, Standley Brooking, MD  CC:   [id number]   01/17/2019 Scapular dyskinesis.  Discussed with patient icing regimen, home exercise, which activities to do which was to avoid.  Patient is to increase activity slowly.  Patient will follow up with me after doing the conservative therapy and see me again in 4 to 6 weeks.  Responded well to osteopathic manipulation.  Update 04/03/2019 [name] is a 49 y.o. female coming in with complaint of thoracic spine pain. Patient states        Past Medical History:  Diagnosis Date  . Allergic rhinitis    hx of testing and allergic to spring and fall f= pollens  . Anemia    nos  . Anxiety   . Chest pain    neg stress test summer 2010  . Endometriosis   . Endometriosis   . Fibroids   . GERD (gastroesophageal reflux disease)   . Goiter   . Headache(784.0)   . 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 Hyperlipidemia   . Hypertension   . Irritable bowel syndrome   . Labial cyst 04/08/2011   early infection use hotpcompresses    . Migraines   . Ovarian torsion   . Sinusitis   . STD (sexually transmitted disease)    Pos. Chlamydia--in her 42's  . Thyroid disease    goiter   Past Surgical History:  Procedure Laterality Date  . CESAREAN SECTION  2002, 2004   x2  . IR ANGIOGRAM PELVIS SELECTIVE OR SUPRASELECTIVE  11/11/2017  . IR ANGIOGRAM PELVIS SELECTIVE OR SUPRASELECTIVE  11/11/2017  . IR ANGIOGRAM SELECTIVE EACH ADDITIONAL VESSEL  11/11/2017  . IR ANGIOGRAM SELECTIVE EACH ADDITIONAL VESSEL  11/11/2017  . IR EMBO TUMOR ORGAN ISCHEMIA INFARCT INC GUIDE ROADMAPPING  11/11/2017  . IR RADIOLOGIST EVAL & MGMT  10/11/2017  . IR RADIOLOGIST EVAL & MGMT  11/29/2017  . IR RADIOLOGIST EVAL & MGMT  08/15/2018  . IR US GUIDE VASC ACCESS  RIGHT  11/11/2017  . PELVIC LAPAROSCOPY     x9  . RADIOLOGY WITH ANESTHESIA N/A 09/05/2014   Procedure: MRI OF BRAIN WITHOUT CONTRAST;  Surgeon: Medication Radiologist, MD;  Location: [facility];  Service: Radiology;  Laterality: N/A;  MRI/DR. JAFFE  . TUBAL LIGATION    . UNILATERAL SALPINGECTOMY Right 1995   ectopic pregnancy   Social History   Socioeconomic History  . Marital status: Single    Spouse name: Not on file  . Number of children: Not on file  . Years of education: Not on file  . Highest education level: Not on file  Occupational History  . Not on file  Tobacco Use  . Smoking status: Never Smoker  . Smokeless tobacco: Never Used  Substance and Sexual Activity  . Alcohol use: No    Alcohol/week: 0.0 standard drinks  . Drug use: No  . Sexual activity: Yes    Partners: Male    Birth control/protection: Surgical    Comment: Tubal  Other Topics Concern  . 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   And graduated as SW.      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 Difficulty of Paying Living Expenses: Not on file  Food Insecurity:   . Worried About Charity fundraiser in the Last Year: Not on file  . Ran Out of Food in the Last Year: Not on file  Transportation Needs:   . Lack of Transportation (Medical): Not on file  . Lack of Transportation (Non-Medical): Not on file  Physical Activity:   . Days of Exercise per Week: Not on file  . Minutes of  Exercise per Session: Not on file  Stress:   . Feeling of Stress : Not on file  Social Connections:   . Frequency of Communication with Friends and Family: Not on file  . Frequency of Social Gatherings with Friends and Family: Not on file  . Attends Religious Services: Not on file  . Active Member of Clubs or Organizations: Not on file  . Attends Archivist Meetings: Not on file  . Marital Status: Not on file   Allergies  Allergen Reactions  . Clonidine Derivatives     Skin rash.  . Codeine Phosphate Hives  . Moxifloxacin     REACTION: cause strange sensation, joint pain. Could not get thumbs to move. Stiffness.  . Sulfamethoxazole Hives  . Sumatriptan Succinate Palpitations   Family History  Problem Relation Age of Onset  . Anxiety disorder Mother        mother is on xanax and apparently applying for  social security diabilty for her anxiety. t  . Depression Mother   . Diabetes Mother   . Hypertension Mother   . Heart attack Father   . Seizures Father   . Stroke Maternal Grandmother   . Diabetes Paternal Grandmother   . Hypertension Paternal Grandmother   . Coronary artery disease Other   . Diabetes Other   . Hyperlipidemia Other   . Other Other        history of child death in family   . Colon cancer Neg Hx   . Stomach cancer Neg Hx   . Esophageal cancer Neg Hx      Current Outpatient Medications (Cardiovascular):  .  atorvastatin (LIPITOR) 10 MG tablet, Take 1 tablet (10 mg total) by mouth daily. Marland Kitchen  triamterene-hydrochlorothiazide (MAXZIDE-25) 37.5-25 MG tablet, TAKE 1 TABLET BY MOUTH DAILY  Current Outpatient Medications (Respiratory):  .  promethazine (PHENERGAN) 25 MG tablet, Take 1 tablet (25 mg total) by mouth every 6 (six) hours as needed for nausea or vomiting.  Current Outpatient Medications (Analgesics):  Marland Kitchen  Galcanezumab-gnlm (EMGALITY [place]), Inject 120 mg into the skin every 30 (thirty) days.    Current Outpatient Medications (Other):  .   amoxicillin (AMOXIL) 400 MG/5ML suspension, Take 6.3 mLs (500 mg total) by mouth 3 (three) times daily. .  baclofen (LIORESAL) 10 MG tablet, Take 10 mg by mouth 3 (three) times daily as needed for muscle spasms.  Marland Kitchen  citalopram (CELEXA) 20 MG tablet, TAKE 1 TABLET(20 MG) BY MOUTH DAILY .  Multiple Vitamin (MULTIVITAMIN WITH MINERALS) TABS tablet, Take 1 tablet by mouth daily. .  OnabotulinumtoxinA (BOTOX IJ), Inject as directed every 3 (three) months. .  pantoprazole (PROTONIX) 40 MG tablet, TAKE 1 TABLET BY MOUTH EVERY DAY .  VITAMIN D PO, Take 2,000 Units by mouth daily. Marland Kitchen  zonisamide (ZONEGRAN) 100 MG capsule, Take 100-300 mg by mouth 2 (two) times daily.    Reviewed prior external information including notes and imaging from  primary care provider As well as notes that were available from care everywhere and other healthcare systems.  Past medical history, social, surgical and family history all reviewed in electronic medical record.  No pertanent information unless stated regarding to the chief complaint.   Review of Systems:  No headache, visual changes, nausea, vomiting, diarrhea, constipation, dizziness, abdominal pain, skin rash, fevers, chills, night sweats, weight loss, swollen lymph nodes, body aches, joint swelling, chest pain, shortness of breath, mood changes. POSITIVE muscle aches  Objective  There were no vitals taken for this visit.   General: No apparent distress alert and oriented x3 mood and affect normal, dressed appropriately.  HEENT: Pupils equal, extraocular movements intact  Respiratory: Patient's speak in full sentences and does not appear short of breath  Cardiovascular: No lower extremity edema, non tender, no erythema  Skin: Warm dry intact with no signs of infection or rash on extremities or on axial skeleton.  Abdomen: Soft nontender  Neuro: Cranial nerves II through XII are intact, neurovascularly intact in all extremities with 2+ DTRs and 2+ pulses.    Lymph: No lymphadenopathy of posterior or anterior cervical chain or axillae bilaterally.  Gait normal with good balance and coordination.  MSK:  Non tender with full range of motion and good stability and symmetric strength and tone of shoulders, elbows, wrist, hip, knee and ankles bilaterally.     Impression and Recommendations:     This case required medical decision making of moderate complexity. The above documentation has been reviewed and is accurate and complete Lyndal Pulley, DO       Note: This dictation was prepared with Dragon dictation along with smaller phrase technology. Any transcriptional errors that result from this process are unintentional.

## 2019-04-09 ENCOUNTER — Other Ambulatory Visit: Payer: Self-pay | Admitting: Internal Medicine

## 2019-04-19 MED ORDER — TRIAMTERENE-HCTZ 37.5-25 MG PO TABS: 1.0000 | ORAL_TABLET | Freq: Every day | ORAL | 0 refills | Status: DC

## 2019-04-19 MED ORDER — ATORVASTATIN CALCIUM 10 MG PO TABS: 10.0000 mg | ORAL_TABLET | Freq: Every day | ORAL | 2 refills | Status: AC

## 2019-04-19 MED ORDER — PANTOPRAZOLE SODIUM 40 MG PO TBEC: 40.0000 mg | DELAYED_RELEASE_TABLET | Freq: Every day | ORAL | 1 refills | Status: AC

## 2019-04-19 MED ORDER — CITALOPRAM HYDROBROMIDE 20 MG PO TABS: 20.0000 mg | ORAL_TABLET | Freq: Every day | ORAL | 0 refills | Status: AC

## 2019-04-30 ENCOUNTER — Other Ambulatory Visit: Payer: Self-pay

## 2019-04-30 ENCOUNTER — Encounter: Payer: Self-pay | Admitting: Family Medicine

## 2019-04-30 ENCOUNTER — Ambulatory Visit: Payer: Managed Care, Other (non HMO) | Admitting: Family Medicine

## 2019-04-30 VITALS — BP 112/70 | HR 82 | Ht 65.5 in | Wt 241.0 lb

## 2019-04-30 DIAGNOSIS — G5603 Carpal tunnel syndrome, bilateral upper limbs: Secondary | ICD-10-CM | POA: Diagnosis not present

## 2019-04-30 DIAGNOSIS — M545 Low back pain, unspecified: Secondary | ICD-10-CM | POA: Insufficient documentation

## 2019-04-30 DIAGNOSIS — M999 Biomechanical lesion, unspecified: Secondary | ICD-10-CM | POA: Diagnosis not present

## 2019-04-30 DIAGNOSIS — G8929 Other chronic pain: Secondary | ICD-10-CM

## 2019-04-30 MED ORDER — VITAMIN D (ERGOCALCIFEROL) 1.25 MG (50000 UNIT) PO CAPS: 50000.0000 [IU] | ORAL_CAPSULE | ORAL | 0 refills | Status: DC

## 2019-04-30 MED ORDER — GABAPENTIN 100 MG PO CAPS: 200.0000 mg | ORAL_CAPSULE | Freq: Every day | ORAL | 3 refills | Status: AC

## 2019-04-30 NOTE — Assessment & Plan Note (Signed)
Bilateral.  Mild to moderate.  Bracing at night, gabapentin prescribed, worsening pain consider formal physical therapy and injection.  Follow-up again in 6 weeks

## 2019-04-30 NOTE — Patient Instructions (Signed)
Gabapentin 200 mg at night  Once weekly vitamin D See me again in 6 weeks

## 2019-04-30 NOTE — Progress Notes (Signed)
[facility] [address] [place] Phone: [phone] Subjective:   I [name] am serving as a Education administrator for Dr. Hulan Saas.  This visit occurred during the SARS-CoV-2 public health emergency.  Safety protocols were in place, including screening questions prior to the visit, additional usage of staff PPE, and extensive cleaning of exam room while observing appropriate contact time as indicated for disinfecting solutions.   I'm seeing this patient by the request  of:  Panosh, Standley Brooking, MD  CC: Low back pain, wrist pain  [id number]   01/17/2019 Scapular dyskinesis.  Discussed with patient icing regimen, home exercise, which activities to do which was to avoid.  Patient is to increase activity slowly.  Patient will follow up with me after doing the conservative therapy and see me again in 4 to 6 weeks.  Responded well to osteopathic manipulation.  04/30/2019 [name] is a 49 y.o. female coming in with complaint of back pain. Patient feels about the same. No worse than before.  Patient has not been seen for some time secondary to people having difficulty.  Patient is switching insurance.  Continues to have some back pain.  States that the manipulation was somewhat helpful.  When she does the exercises regularly seem to do well and now worsening again.     Past Medical History:  Diagnosis Date  . Allergic rhinitis    hx of testing and allergic to spring and fall f= pollens  . Anemia    nos  . Anxiety   . Chest pain    neg stress test summer 2010  . Endometriosis   . Endometriosis   . Fibroids   . GERD (gastroesophageal reflux disease)   . Goiter   . Headache(784.0)   . 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 Hyperlipidemia   . Hypertension   . Irritable bowel syndrome   . Labial cyst 04/08/2011   early infection use hotpcompresses    . Migraines   . Ovarian torsion   .  Sinusitis   . STD (sexually transmitted disease)    Pos. Chlamydia--in her 21's  . Thyroid disease    goiter   Past Surgical History:  Procedure Laterality Date  . CESAREAN SECTION  2002, 2004   x2  . IR ANGIOGRAM PELVIS SELECTIVE OR SUPRASELECTIVE  11/11/2017  . IR ANGIOGRAM PELVIS SELECTIVE OR SUPRASELECTIVE  11/11/2017  . IR ANGIOGRAM SELECTIVE EACH ADDITIONAL VESSEL  11/11/2017  . IR ANGIOGRAM SELECTIVE EACH ADDITIONAL VESSEL  11/11/2017  . IR EMBO TUMOR ORGAN ISCHEMIA INFARCT INC GUIDE ROADMAPPING  11/11/2017  . IR RADIOLOGIST EVAL & MGMT  10/11/2017  . IR RADIOLOGIST EVAL & MGMT  11/29/2017  . IR RADIOLOGIST EVAL & MGMT  08/15/2018  . IR US GUIDE VASC ACCESS RIGHT  11/11/2017  . PELVIC LAPAROSCOPY     x9  . RADIOLOGY WITH ANESTHESIA N/A 09/05/2014   Procedure: MRI OF BRAIN WITHOUT CONTRAST;  Surgeon: Medication Radiologist, MD;  Location: [facility];  Service: Radiology;  Laterality: N/A;  MRI/DR. JAFFE  . TUBAL LIGATION    . UNILATERAL SALPINGECTOMY Right 1995   ectopic pregnancy   Social History   Socioeconomic History  . Marital status: Single    Spouse name: Not on file  . Number of children: Not on file  . Years of education: Not on file  . Highest education level: Not on file  Occupational History  . Not on file  Tobacco Use  . Smoking status: Never Smoker  . Smokeless tobacco: Never Used  Substance and Sexual Activity  . Alcohol use: No    Alcohol/week: 0.0 standard drinks  . Drug use: No  . Sexual activity: Yes    Partners: Male    Birth control/protection: Surgical    Comment: Tubal  Other Topics Concern  . 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   And graduated as SW.        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 Difficulty of Paying Living Expenses:   Food Insecurity:   . Worried About Charity fundraiser in the Last Year:   . Arboriculturist in the Last Year:   Transportation Needs:   . Film/video editor (Medical):   Marland Kitchen Lack of Transportation (Non-Medical):   Physical Activity:   . Days of Exercise per Week:   . Minutes of Exercise per Session:   Stress:   . Feeling of Stress :   Social Connections:   . Frequency of Communication with Friends and Family:   . Frequency of Social Gatherings with Friends and Family:   . Attends Religious Services:   . Active Member of Clubs or Organizations:   . Attends Archivist Meetings:   Marland Kitchen Marital Status:    Allergies  Allergen Reactions  . Clonidine Derivatives     Skin rash.  . Codeine Phosphate Hives  . Moxifloxacin     REACTION: cause strange sensation, joint pain. Could not get thumbs to move. Stiffness.  . Sulfamethoxazole Hives  . Sumatriptan Succinate Palpitations   Family History  Problem Relation Age of Onset  . Anxiety disorder Mother        mother is on xanax and apparently applying for  social security diabilty for her anxiety. t  . Depression Mother   . Diabetes Mother   . Hypertension Mother   . Heart attack Father   . Seizures Father   . Stroke Maternal Grandmother   . Diabetes Paternal Grandmother   . Hypertension Paternal Grandmother   . Coronary artery disease Other   . Diabetes Other   . Hyperlipidemia Other   . Other Other        history of child death in family   . Colon cancer Neg Hx   . Stomach cancer Neg Hx   . Esophageal cancer Neg Hx      Current Outpatient Medications (Cardiovascular):  .  atorvastatin  (LIPITOR) 10 MG tablet, Take 1 tablet (10 mg total) by mouth daily. Marland Kitchen  triamterene-hydrochlorothiazide (MAXZIDE-25) 37.5-25 MG tablet, Take 1 tablet by mouth daily.  Current Outpatient Medications (Respiratory):  .  promethazine (PHENERGAN) 25 MG tablet, Take 1  tablet (25 mg total) by mouth every 6 (six) hours as needed for nausea or vomiting.  Current Outpatient Medications (Analgesics):  Marland Kitchen  Galcanezumab-gnlm (EMGALITY [place]), Inject 120 mg into the skin every 30 (thirty) days.    Current Outpatient Medications (Other):  .  amoxicillin (AMOXIL) 400 MG/5ML suspension, Take 6.3 mLs (500 mg total) by mouth 3 (three) times daily. .  baclofen (LIORESAL) 10 MG tablet, Take 10 mg by mouth 3 (three) times daily as needed for muscle spasms.  .  citalopram (CELEXA) 20 MG tablet, Take 1 tablet (20 mg total) by mouth daily. .  Multiple Vitamin (MULTIVITAMIN WITH MINERALS) TABS tablet, Take 1 tablet by mouth daily. .  OnabotulinumtoxinA (BOTOX IJ), Inject as directed every 3 (three) months. .  pantoprazole (PROTONIX) 40 MG tablet, Take 1 tablet (40 mg total) by mouth daily. Marland Kitchen  VITAMIN D PO, Take 2,000 Units by mouth daily. Marland Kitchen  zonisamide (ZONEGRAN) 100 MG capsule, Take 100-300 mg by mouth 2 (two) times daily.  Marland Kitchen  gabapentin (NEURONTIN) 100 MG capsule, Take 2 capsules (200 mg total) by mouth at bedtime. .  Vitamin D, Ergocalciferol, (DRISDOL) 1.25 MG (50000 UNIT) CAPS capsule, Take 1 capsule (50,000 Units total) by mouth every 7 (seven) days.   Reviewed prior external information including notes and imaging from  primary care provider As well as notes that were available from care everywhere and other healthcare systems.  Past medical history, social, surgical and family history all reviewed in electronic medical record.  No pertanent information unless stated regarding to the chief complaint.   Review of Systems:  No headache, visual changes, nausea, vomiting, diarrhea, constipation, dizziness,  abdominal pain, skin rash, fevers, chills, night sweats, weight loss, swollen lymph nodes, body aches, joint swelling, chest pain, shortness of breath, mood changes. POSITIVE muscle aches  Objective  Blood pressure 112/70, pulse 82, height 5' 5.5" (1.664 m), weight 241 lb (109.3 kg), SpO2 97 %.   General: No apparent distress alert and oriented x3 mood and affect normal, dressed appropriately.  HEENT: Pupils equal, extraocular movements intact  Respiratory: Patient's speak in full sentences and does not appear short of breath  Cardiovascular: No lower extremity edema, non tender, no erythema  Neuro: Cranial nerves II through XII are intact, neurovascularly intact in all extremities with 2+ DTRs and 2+ pulses.  Gait mild antalgic Wrist bilaterally shows the patient does have positive Tinel's.  Patient does have good grip strength though.  Negative Spurling's of the neck.  Neurovascularly intact distally.  Low back exam does have some tenderness to palpation of the paraspinal musculature lumbar spine right greater than left.  Tightness was straight leg test but no true radicular symptoms.  Pain more in the thoracolumbar juncture right greater than left.  Osteopathic findings  C6 flexed rotated and side bent left T3 extended rotated and side bent right inhaled third rib T9 extended rotated and side bent left L2 flexed rotated and side bent right L4 flexed rotated and side bent left Sacrum right on right    Impression and Recommendations:     This case required medical decision making of moderate complexity. The above documentation has been reviewed and is accurate and complete Lyndal Pulley, DO       Note: This dictation was prepared with Dragon dictation along with smaller phrase technology. Any transcriptional errors that result from this process are unintentional.

## 2019-04-30 NOTE — Assessment & Plan Note (Signed)

## 2019-04-30 NOTE — Assessment & Plan Note (Signed)
Low back pain more in the thoracolumbar juncture.  Responding fairly well to osteopathic manipulation today.  Discussed which activities to do which wants to avoid.  Discussed posture and ergonomics, and gabapentin written for this as well as for the carpal tunnel level as noted.  Follow-up again in 4 to 8 weeks.

## 2019-05-07 ENCOUNTER — Telehealth: Payer: Self-pay | Admitting: *Deleted

## 2019-05-07 NOTE — Telephone Encounter (Signed)
Called patient and she stated that this happened Friday and she went to Urgent Care on Saturday and she has an appointment tomorrow with her neurologist Dr. Amil Amen in [place] and will address tomorrow since this is the reason for her visit. Patient will contact us if anything further is needed. Sending as an Pharmacist, hospital.

## 2019-05-07 NOTE — Telephone Encounter (Signed)
Patient called the after hours line on 05/05/2019. Patient reports she has a new medication Hydroxazine 100 mg she took one yesterday after work for the migraine and that made her sleepy and did not help the migraine, made it worse and she is afraid to take anything else without speaking to a physician. She also hasUbrelvy. Can she take these together? Pain 9/10. Patient was advised by triage nurse to go to [facility] ED.   Clinic RN does not see where patient went. Please advise

## 2019-05-07 NOTE — Telephone Encounter (Signed)
Please also have her contact the prescribing provider (  [facility]?)   I agree with not taking for now until provider screening assesses.

## 2019-05-08 NOTE — Telephone Encounter (Signed)
These meds should be   Managed and prescribed buy the original prescriber team.

## 2019-06-13 ENCOUNTER — Ambulatory Visit: Payer: Managed Care, Other (non HMO) | Admitting: Family Medicine

## 2019-06-18 ENCOUNTER — Telehealth: Payer: Self-pay | Admitting: Family Medicine

## 2019-06-18 NOTE — Telephone Encounter (Signed)
Patient called asking if she should continue taking the prescription Vitamin D. If so, she will only have one week left with her current prescription. Please advise.

## 2019-06-19 ENCOUNTER — Other Ambulatory Visit: Payer: Self-pay

## 2019-06-19 MED ORDER — VITAMIN D (ERGOCALCIFEROL) 1.25 MG (50000 UNIT) PO CAPS: 50000.0000 [IU] | ORAL_CAPSULE | ORAL | 0 refills | Status: DC

## 2019-06-19 NOTE — Telephone Encounter (Signed)
Let patient know Rx sent in via [facility].

## 2019-10-12 ENCOUNTER — Other Ambulatory Visit: Payer: Self-pay | Admitting: Family Medicine

## 2019-10-14 ENCOUNTER — Other Ambulatory Visit: Payer: Self-pay | Admitting: Internal Medicine

## 2019-10-15 NOTE — Telephone Encounter (Signed)
Last OV 02/13/2019 Virtual Visit  Last filled 04/19/2019, # 29 with 0 refills  This Rx is requesting quantity 45  Do you know if patient is taking 1 tablet or half a tablet?  Please advise.

## 2019-10-18 NOTE — Telephone Encounter (Signed)
Called patient and left a detailed voice message to let patient know that I need for her to call back and give me the dose she is taking. We have 2 different dosages on file and need to know if she is taking half a tablet or if she is taking a whole tablet.

## 2019-10-18 NOTE — Telephone Encounter (Signed)
Pt called with the following information  Dose: .5 to 1 whole tablet daily..  Please advise

## 2019-10-18 NOTE — Telephone Encounter (Signed)
Pt is calling in stating that she is out of Rx triamterene-hydrochlorothiazide (MAXZIDE-25)  37.5-25 MG  Pharm:  [facility] [address] [facility].

## 2019-10-19 NOTE — Telephone Encounter (Signed)
Pt is calling to follow up on her medication

## 2019-12-04 ENCOUNTER — Telehealth: Payer: Self-pay

## 2019-12-04 ENCOUNTER — Encounter: Payer: Self-pay | Admitting: Obstetrics & Gynecology

## 2019-12-04 MED ORDER — FLUTICASONE PROPIONATE 50 MCG/ACT NA SUSP: 2.0000 | Freq: Every day | NASAL | 6 refills | Status: AC

## 2019-12-04 NOTE — Telephone Encounter (Signed)
Patient called in requesting a Rx for medication. She hasn't had medication in a long while, but states she needs it this tine of the year for allergies   Please advise   fluticasone (FLONASE) 50 MCG/ACT    [id number] - [place], [place] - [address] AT [address]

## 2019-12-04 NOTE — Telephone Encounter (Signed)
Rx sent in

## 2019-12-04 NOTE — Telephone Encounter (Signed)
Ok to refill x 6  

## 2019-12-04 NOTE — Telephone Encounter (Signed)
Okay to refill? 

## 2020-01-08 ENCOUNTER — Other Ambulatory Visit: Payer: Self-pay | Admitting: Family Medicine

## 2020-01-08 ENCOUNTER — Other Ambulatory Visit: Payer: Self-pay | Admitting: Internal Medicine

## 2020-01-20 ENCOUNTER — Other Ambulatory Visit: Payer: Self-pay | Admitting: Internal Medicine

## 2020-01-20 ENCOUNTER — Other Ambulatory Visit: Payer: Self-pay | Admitting: Family Medicine

## 2020-01-22 ENCOUNTER — Other Ambulatory Visit: Payer: Self-pay

## 2020-01-22 ENCOUNTER — Encounter (HOSPITAL_BASED_OUTPATIENT_CLINIC_OR_DEPARTMENT_OTHER): Payer: Self-pay | Admitting: Emergency Medicine

## 2020-01-22 DIAGNOSIS — G43009 Migraine without aura, not intractable, without status migrainosus: Secondary | ICD-10-CM | POA: Diagnosis present

## 2020-01-22 DIAGNOSIS — I10 Essential (primary) hypertension: Secondary | ICD-10-CM | POA: Insufficient documentation

## 2020-01-22 NOTE — ED Triage Notes (Signed)
Pt reports migraine for two weeks; reports hx of migraine along with left sided weakness, nausea, light sensitivity and dizziness; pt reports ear pain as well; pt is alert and oriented and was ambulatory; able to move all extremities and shows no signs of stroke at this time; neurologist sent her here

## 2020-01-22 NOTE — ED Triage Notes (Signed)
Pt is vaccinated for COVID; pt has taken prescription migraine medications with no relief

## 2020-01-23 ENCOUNTER — Emergency Department (HOSPITAL_BASED_OUTPATIENT_CLINIC_OR_DEPARTMENT_OTHER)
Admission: EM | Admit: 2020-01-23 | Discharge: 2020-01-23 | Disposition: A | Discharge status: Home / Self Care | Payer: BLUE CROSS/BLUE SHIELD | Attending: Emergency Medicine | Admitting: Emergency Medicine

## 2020-01-23 DIAGNOSIS — G43009 Migraine without aura, not intractable, without status migrainosus: Secondary | ICD-10-CM

## 2020-01-23 MED ORDER — METOCLOPRAMIDE HCL 5 MG/ML IJ SOLN
10.0000 mg | Freq: Once | INTRAMUSCULAR | Status: AC
  Administered 2020-01-23: 10 mg via INTRAVENOUS
  Filled 2020-01-23: qty 2

## 2020-01-23 MED ORDER — DEXAMETHASONE SODIUM PHOSPHATE 10 MG/ML IJ SOLN
10.0000 mg | Freq: Once | INTRAMUSCULAR | Status: AC
  Administered 2020-01-23: 10 mg via INTRAVENOUS
  Filled 2020-01-23: qty 1

## 2020-01-23 MED ORDER — MAGNESIUM SULFATE 2 GM/50ML IV SOLN
2.0000 g | Freq: Once | INTRAVENOUS | Status: AC
  Administered 2020-01-23: 2 g via INTRAVENOUS
  Filled 2020-01-23: qty 50

## 2020-01-23 MED ORDER — KETOROLAC TROMETHAMINE 30 MG/ML IJ SOLN
30.0000 mg | Freq: Once | INTRAMUSCULAR | Status: AC
  Administered 2020-01-23: 30 mg via INTRAVENOUS
  Filled 2020-01-23: qty 1

## 2020-01-23 MED ORDER — SODIUM CHLORIDE 0.9 % IV BOLUS
500.0000 mL | Freq: Once | INTRAVENOUS | Status: AC
  Administered 2020-01-23: 500 mL via INTRAVENOUS

## 2020-01-23 MED ORDER — DIPHENHYDRAMINE HCL 50 MG/ML IJ SOLN
50.0000 mg | Freq: Once | INTRAMUSCULAR | Status: AC
  Administered 2020-01-23: 50 mg via INTRAVENOUS
  Filled 2020-01-23: qty 1

## 2020-01-23 NOTE — ED Provider Notes (Signed)
Emergency Department Provider Note   I have reviewed the triage vital signs and the nursing notes.   HISTORY  Chief Complaint Migraine   HPI [name] is a 49 y.o. female with past medical history reviewed below including complicated migraine presents to the emergency department with 2 weeks of migraine headache symptoms.  She reports photophobia with nausea and lightheadedness.  She feels strange on her left side with slight subjective weakness which she states is typical of her migraines.  She has a throbbing frontal headache which is also typical of her migraines.  Denies any sudden onset, maximal intensity headache symptoms.  No fevers or chills.  No significant neck or back discomfort.  She has been put on a Medrol Dosepak and taking her home medications with no relief in symptoms.  She discussed her intractable migraine headache with her neurologist and was referred to the emergency department for evaluation.   Past Medical History:  Diagnosis Date  . Allergic rhinitis    hx of testing and allergic to spring and fall f= pollens  . Anemia    nos  . Anxiety   . Chest pain    neg stress test summer 2010  . Endometriosis   . Endometriosis   . Fibroids   . GERD (gastroesophageal reflux disease)   . Goiter   . Headache(784.0)   . 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 Hyperlipidemia   . Hypertension   . Irritable bowel syndrome   . Labial cyst 04/08/2011   early infection use hotpcompresses    . Migraines   . Ovarian torsion   . Sinusitis   . STD (sexually transmitted disease)    Pos. Chlamydia--in her 11's  . Thyroid disease    goiter    Patient Active Problem List   Diagnosis Date Noted  . Low back pain 04/30/2019  . Scapular dyskinesis 01/17/2019  . Nonallopathic lesion of thoracic region 01/17/2019  . Nonallopathic lesion of sacral region 01/17/2019  . Nonallopathic lesion of lumbosacral region 01/17/2019   . Nonallopathic lesion of cervical region 01/17/2019  . Nonallopathic lesion of rib cage 01/17/2019  . Uterine fibroid 11/11/2017  . Fibroids 11/11/2017  . Depression 06/07/2017  . Bilateral carpal tunnel syndrome 01/20/2017  . Left sided numbness 01/20/2017  . Morbid obesity (HCC) 01/30/2015  . 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 RUQ pain 02/25/2014  . Diarrhea 02/25/2014  . Calcium oxalate crystals in urine 02/22/2014  . Intramural leiomyoma of uterus 02/22/2014  . Hx of migraine headaches 02/04/2014  . Medication management 02/04/2014  . Hx of endometriosis 02/04/2014  . Right lower quadrant abdominal pain mid also 02/04/2014  . Adjustment disorder with anxious mood 01/17/2014  . Thyroid mass 02/16/2013  . Fever, unspecified 02/15/2013  . Sinusitis 02/15/2013  . Anxiety 12/11/2011  . Stress 12/11/2011  . Irregular periods 08/17/2011  . Endometriosis 08/17/2011  . Edema 05/10/2011  . Elevated blood pressure reading 05/10/2011  . Fatigue 04/08/2011  . Upper respiratory tract infection 04/08/2011  . SINUSITIS, RECURRENT 11/06/2009  . FATIGUE 02/25/2009  . PALPITATIONS 02/25/2009  . DYSPNEA 02/25/2009  . KNEE INJURY, RIGHT 12/05/2007  . HEADACHE 05/03/2007  . ADULT SITUATIONAL REACTION 11/21/2006  . SLEEPLESSNESS 11/21/2006  . Other and unspecified hyperlipidemia 08/31/2006  . DISORDER, ADJUSTMENT W/ANXIETY 08/31/2006  . PLANTAR FASCIITIS 08/31/2006  . ANEMIA-NOS 06/23/2006  . MIGRAINE, COMMON 06/23/2006  . ALLERGIC RHINITIS 06/23/2006  .  GERD 06/23/2006  . IRRITABLE BOWEL SYNDROME 06/23/2006    Past Surgical History:  Procedure Laterality Date  . CESAREAN SECTION  2002, 2004   x2  . IR ANGIOGRAM PELVIS SELECTIVE OR SUPRASELECTIVE  11/11/2017  . IR ANGIOGRAM PELVIS SELECTIVE OR SUPRASELECTIVE  11/11/2017  . IR ANGIOGRAM SELECTIVE EACH ADDITIONAL VESSEL  11/11/2017  . IR ANGIOGRAM SELECTIVE EACH ADDITIONAL VESSEL  11/11/2017   . IR EMBO TUMOR ORGAN ISCHEMIA INFARCT INC GUIDE ROADMAPPING  11/11/2017  . IR RADIOLOGIST EVAL & MGMT  10/11/2017  . IR RADIOLOGIST EVAL & MGMT  11/29/2017  . IR RADIOLOGIST EVAL & MGMT  08/15/2018  . IR US GUIDE VASC ACCESS RIGHT  11/11/2017  . PELVIC LAPAROSCOPY     x9  . RADIOLOGY WITH ANESTHESIA N/A 09/05/2014   Procedure: MRI OF BRAIN WITHOUT CONTRAST;  Surgeon: Medication Radiologist, MD;  Location: [facility];  Service: Radiology;  Laterality: N/A;  MRI/DR. JAFFE  . TUBAL LIGATION    . UNILATERAL SALPINGECTOMY Right 1995   ectopic pregnancy    Allergies Hydroxyzine, Clonidine derivatives, Codeine phosphate, Moxifloxacin, Sulfamethoxazole, and Sumatriptan succinate  Family History  Problem Relation Age of Onset  . Anxiety disorder Mother        mother is on xanax and apparently applying for  social security diabilty for her anxiety. t  . Depression Mother   . Diabetes Mother   . Hypertension Mother   . Heart attack Father   . Seizures Father   . Stroke Maternal Grandmother   . Diabetes Paternal Grandmother   . Hypertension Paternal Grandmother   . Coronary artery disease Other   . Diabetes Other   . Hyperlipidemia Other   . Other Other        history of child death in family   . Colon cancer Neg Hx   . Stomach cancer Neg Hx   . Esophageal cancer Neg Hx     Social History Social History   Tobacco Use  . Smoking status: Never Smoker  . Smokeless tobacco: Never Used  Vaping Use  . Vaping Use: Never used  Substance Use Topics  . Alcohol use: No    Alcohol/week: 0.0 standard drinks  . Drug use: No    Review of Systems  Constitutional: No fever/chills Eyes: No visual changes. Positive photophobia.  ENT: No sore throat. Cardiovascular: Denies chest pain. Respiratory: Denies shortness of breath. Gastrointestinal: No abdominal pain. Positive nausea, no vomiting.  No diarrhea.  No constipation. Genitourinary: Negative for dysuria. Musculoskeletal: Negative for back  pain. Skin: Negative for rash. Neurological: Negative for focal numbness. Positive HA and subjective left side weakness.   10-point ROS otherwise negative.  ____________________________________________   PHYSICAL EXAM:  VITAL SIGNS: ED Triage Vitals  Enc Vitals Group     BP 01/22/20 1917 133/80     Pulse Rate 01/22/20 1917 85     Resp 01/22/20 1917 16     Temp 01/22/20 1917 98.7 F (37.1 C)     Temp Source 01/22/20 1917 Oral     SpO2 01/22/20 1917 99 %     Weight 01/22/20 1918 244 lb (110.7 kg)     Height 01/22/20 1918 5\' 5"  (1.651 m)   Constitutional: Alert and oriented. Well appearing and in no acute distress. Eyes: Conjunctivae are normal. PERRL. EOMI. Head: Atraumatic. Ears:  Healthy appearing ear canals and TMs bilaterally Nose: No congestion/rhinnorhea. Mouth/Throat: Mucous membranes are moist.  Oropharynx non-erythematous. Neck: No stridor.  No meningeal signs.   Cardiovascular:  Normal rate, regular rhythm. Good peripheral circulation. Grossly normal heart sounds.   Respiratory: Normal respiratory effort.  No retractions. Lungs CTAB. Gastrointestinal: Soft and nontender. No distention.  Musculoskeletal: No lower extremity tenderness nor edema. No gross deformities of extremities. Neurologic:  Normal speech and language. No gross focal neurologic deficits are appreciated. 5/5 strength in the bilateral upper and lower extremities. Normal sensation in the upper and lower extremities.   ____________________________________________  RADIOLOGY  None  ____________________________________________   PROCEDURES  Procedure(s) performed:   Procedures  None  ____________________________________________   INITIAL IMPRESSION / ASSESSMENT AND PLAN / ED COURSE  Pertinent labs & imaging results that were available during my care of the patient were reviewed by me and considered in my medical decision making (see chart for details).   Patient presents to the emergency  department with typical migraine symptoms for her.  She has some subjective weakness on the left side but this is not appreciated on my exam.  This is also typical of her migraine headaches.  No historical features or exam findings to suspect alternate etiology for headache such as subarachnoid hemorrhage or meningitis.  Plan for migraine cocktail and reassess.  02:10 AM  Patient reevaluated and feeling much better after migraine cocktail.  Neurologic exam remains unchanged.  Plan for discharge with plan for neurology follow-up as an outpatient.  Patient has a driver to take her home.    ____________________________________________  FINAL CLINICAL IMPRESSION(S) / ED DIAGNOSES  Final diagnoses:  Migraine without aura and without status migrainosus, not intractable     MEDICATIONS GIVEN DURING THIS VISIT:  Medications  sodium chloride 0.9 % bolus 500 mL ( Intravenous Stopped 01/23/20 0218)  magnesium sulfate IVPB 2 g 50 mL (0 g Intravenous Stopped 01/23/20 0113)  dexamethasone (DECADRON) injection 10 mg (10 mg Intravenous Given 01/23/20 0104)  ketorolac (TORADOL) 30 MG/ML injection 30 mg (30 mg Intravenous Given 01/23/20 0106)  metoCLOPramide (REGLAN) injection 10 mg (10 mg Intravenous Given 01/23/20 0102)  diphenhydrAMINE (BENADRYL) injection 50 mg (50 mg Intravenous Given 01/23/20 0105)     Note:  This document was prepared using Dragon voice recognition software and may include unintentional dictation errors.  Alona Bene, MD, [facility]    Danikah Budzik, Arlyss Repress, MD 01/23/20 Emeline Darling

## 2020-01-23 NOTE — Discharge Instructions (Signed)

## 2020-03-17 NOTE — Telephone Encounter (Signed)
Encounter opened in error

## 2020-09-29 ENCOUNTER — Other Ambulatory Visit: Payer: Self-pay | Admitting: Family Medicine

## 2020-11-07 ENCOUNTER — Other Ambulatory Visit: Payer: Self-pay | Admitting: Internal Medicine

## 2020-12-05 ENCOUNTER — Other Ambulatory Visit: Payer: Self-pay | Admitting: Internal Medicine

## 2021-01-02 ENCOUNTER — Other Ambulatory Visit: Payer: Self-pay | Admitting: Internal Medicine

## 2021-02-21 ENCOUNTER — Other Ambulatory Visit: Payer: Self-pay | Admitting: Family Medicine

## 2021-03-31 NOTE — Progress Notes (Deleted)
?Charlann Boxer D.O. ?[facility] ?[place] ?Phone: [phone] ?Subjective:   ? ?I'm seeing this patient by the request  of:  Panosh, Standley Brooking, MD ? ?CC:  ? ?[id number]  ?[name] is a 51 y.o. female coming in with complaint of back and neck pain. OMT on 04/30/2019. Here for Vit D refill. Patient states  ? ?Medications patient has been prescribed: None ? ?Taking: ? ? ?  ? ? ? ? ?Reviewed prior external information including notes and imaging from previsou exam, outside providers and external EMR if available.  ? ?As well as notes that were available from care everywhere and other healthcare systems. ? ?Past medical history, social, surgical and family history all reviewed in electronic medical record.  No pertanent information unless stated regarding to the chief complaint.  ? ?Past Medical History:  ?Diagnosis Date  ? Allergic rhinitis   ? hx of testing and allergic to spring and fall f= pollens  ? Anemia   ? nos  ? Anxiety   ? Chest pain   ? neg stress test summer 2010  ? Endometriosis   ? Endometriosis   ? Fibroids   ? GERD (gastroesophageal reflux disease)   ? Goiter   ? Headache(784.0)   ? 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 positive type 1 and type 2  ? Hyperlipidemia   ? Hypertension   ? Irritable bowel syndrome   ? Labial cyst 04/08/2011  ? early infection use hotpcompresses    ? Migraines   ? Ovarian torsion   ? Sinusitis   ? STD (sexually transmitted disease)   ? Pos. Chlamydia--in her 20's  ? Thyroid disease   ? goiter  ?  ?Allergies  ?Allergen Reactions  ? Hydroxyzine Other (See Comments)  ?  Somnolence ?Somnolence ?  ? Clonidine Derivatives   ?  Skin rash.  ? Codeine Phosphate Hives  ? Moxifloxacin   ?  REACTION: cause strange sensation, joint pain. Could not get thumbs to move. Stiffness.  ? Sulfamethoxazole Hives  ? Sumatriptan Succinate Palpitations  ? ? ? ?Review of Systems: ? No headache, visual changes, nausea, vomiting, diarrhea,  constipation, dizziness, abdominal pain, skin rash, fevers, chills, night sweats, weight loss, swollen lymph nodes, body aches, joint swelling, chest pain, shortness of breath, mood changes. POSITIVE muscle aches ? ?Objective  ?There were no vitals taken for this visit. ?  ?General: No apparent distress alert and oriented x3 mood and affect normal, dressed appropriately.  ?HEENT: Pupils equal, extraocular movements intact  ?Respiratory: Patient's speak in full sentences and does not appear short of breath  ?Cardiovascular: No lower extremity edema, non tender, no erythema  ?Neuro: Cranial nerves II through XII are intact, neurovascularly intact in all extremities with 2+ DTRs and 2+ pulses.  ?Gait normal with good balance and coordination.  ?MSK:  Non tender with full range of motion and good stability and symmetric strength and tone of shoulders, elbows, wrist, hip, knee and ankles bilaterally.  ?Back - Normal skin, Spine with normal alignment and no deformity.  No tenderness to vertebral process palpation.  Paraspinous muscles are not tender and without spasm.   Range of motion is full at neck and lumbar sacral regions ? ?Osteopathic findings ? ?C2 flexed rotated and side bent right ?C6 flexed rotated and side bent left ?T3 extended rotated and side bent right inhaled rib ?T9 extended rotated and side bent left ?L2 flexed rotated and side  bent right ?Sacrum right on right ? ? ? ? ?  ?Assessment and Plan: ? ? ? ?Nonallopathic problems ? ?Decision today to treat with OMT was based on Physical Exam ? ?After verbal consent patient was treated with HVLA, ME, FPR techniques in cervical, rib, thoracic, lumbar, and sacral  areas ? ?Patient tolerated the procedure well with improvement in symptoms ? ?Patient given exercises, stretches and lifestyle modifications ? ?See medications in patient instructions if given ? ?Patient will follow up in 4-8 weeks ? ?  ? ? ?The above documentation has been reviewed and is accurate and  complete [name] ? ? ?  ? ? Note: This dictation was prepared with Dragon dictation along with smaller phrase technology. Any transcriptional errors that result from this process are unintentional.    ?  ?  ? ?

## 2021-04-01 ENCOUNTER — Ambulatory Visit: Payer: Medicaid Other | Admitting: Family Medicine

## 2022-03-22 ENCOUNTER — Telehealth: Payer: Self-pay

## 2022-03-22 NOTE — Transitions of Care (Post Inpatient/ED Visit) (Unsigned)
   03/22/2022  Name: [name] MRN: [id number] DOB: [birth year]  Today's TOC FU Call Status: Today's TOC FU Call Status:: Unsuccessul Call (1st Attempt) Unsuccessful Call (1st Attempt) Date: 03/22/22  Attempted to reach the patient regarding the most recent Inpatient/ED visit.  Follow Up Plan: Additional outreach attempts will be made to reach the patient to complete the Transitions of Care (Post Inpatient/ED visit) call.   [place] LPN [place] Advisor Direct Dial [phone]

## 2022-03-23 NOTE — Transitions of Care (Post Inpatient/ED Visit) (Unsigned)
   03/23/2022  Name: [name] MRN: [id number] DOB: [birth year]  Today's TOC FU Call Status: Today's TOC FU Call Status:: Unsuccessful Call (2nd Attempt) Unsuccessful Call (1st Attempt) Date: 03/22/22 Unsuccessful Call (2nd Attempt) Date: 03/23/22  Attempted to reach the patient regarding the most recent Inpatient/ED visit.  Follow Up Plan: Additional outreach attempts will be made to reach the patient to complete the Transitions of Care (Post Inpatient/ED visit) call.   [place] LPN [place] Advisor Direct Dial [phone]

## 2022-03-24 NOTE — Transitions of Care (Post Inpatient/ED Visit) (Signed)
   03/24/2022  Name: [name] MRN: [id number] DOB: 1970/07/05  Today's TOC FU Call Status: Today's TOC FU Call Status:: Unsuccessful Call (3rd Attempt) Unsuccessful Call (1st Attempt) Date: 03/22/22 Unsuccessful Call (2nd Attempt) Date: 03/23/22 Unsuccessful Call (3rd Attempt) Date: 03/24/22  Attempted to reach the patient regarding the most recent Inpatient/ED visit.  Follow Up Plan: No further outreach attempts will be made at this time. We have been unable to contact the patient.  [place] LPN [place] Advisor Direct Dial [phone]

## 2022-04-01 ENCOUNTER — Telehealth: Payer: Self-pay

## 2022-04-01 NOTE — Transitions of Care (Post Inpatient/ED Visit) (Signed)
   04/01/2022  Name: [name] MRN: [id number] DOB: [birth year]  Today's TOC FU Call Status: Today's TOC FU Call Status:: Unsuccessul Call (1st Attempt) Unsuccessful Call (1st Attempt) Date: 04/01/22  Attempted to reach the patient regarding the most recent Inpatient/ED visit.  Follow Up Plan: Additional outreach attempts will be made to reach the patient to complete the Transitions of Care (Post Inpatient/ED visit) call.   [place] LPN [place] Advisor Direct Dial [phone]

## 2022-04-05 NOTE — Transitions of Care (Post Inpatient/ED Visit) (Unsigned)
   04/05/2022  Name: [name] MRN: [id number] DOB: Jul 13, 1970  Today's TOC FU Call Status: Today's TOC FU Call Status:: Unsuccessful Call (2nd Attempt) Unsuccessful Call (1st Attempt) Date: 04/01/22 Unsuccessful Call (2nd Attempt) Date: 04/05/22  Attempted to reach the patient regarding the most recent Inpatient/ED visit.  Follow Up Plan: Additional outreach attempts will be made to reach the patient to complete the Transitions of Care (Post Inpatient/ED visit) call.   [place] LPN [place] Advisor Direct Dial [phone]

## 2022-04-06 NOTE — Transitions of Care (Post Inpatient/ED Visit) (Signed)
   04/06/2022  Name: [name] MRN: [id number] DOB: March 30, 1970  Today's TOC FU Call Status: Today's TOC FU Call Status:: Unsuccessful Call (3rd Attempt) Unsuccessful Call (1st Attempt) Date: 04/01/22 Unsuccessful Call (2nd Attempt) Date: 04/05/22 Unsuccessful Call (3rd Attempt) Date: 04/06/22  Attempted to reach the patient regarding the most recent Inpatient/ED visit.  Follow Up Plan: No further outreach attempts will be made at this time. We have been unable to contact the patient.  [place] LPN [place] Advisor Direct Dial [phone]
